# Patient Record
Sex: Female | Born: 1935 | Race: White | Hispanic: No | Smoking: Never smoker
Health system: Southern US, Community
[De-identification: ages and names within clinical notes are randomized; demographics above are authoritative.]

## PROBLEM LIST (undated history)

## (undated) DIAGNOSIS — I10 Essential (primary) hypertension: Secondary | ICD-10-CM

## (undated) DIAGNOSIS — E785 Hyperlipidemia, unspecified: Secondary | ICD-10-CM

## (undated) DIAGNOSIS — C9110 Chronic lymphocytic leukemia of B-cell type not having achieved remission: Principal | ICD-10-CM

## (undated) DIAGNOSIS — C9590 Leukemia, unspecified not having achieved remission: Secondary | ICD-10-CM

## (undated) DIAGNOSIS — R0602 Shortness of breath: Secondary | ICD-10-CM

## (undated) HISTORY — DX: Chronic lymphocytic leukemia of B-cell type not having achieved remission: C91.10

## (undated) HISTORY — PX: CHOLECYSTECTOMY: SHX55

## (undated) HISTORY — DX: Shortness of breath: R06.02

## (undated) HISTORY — DX: Hyperlipidemia, unspecified: E78.5

## (undated) HISTORY — PX: TOTAL KNEE ARTHROPLASTY: SHX125

## (undated) HISTORY — DX: Essential (primary) hypertension: I10

## (undated) HISTORY — PX: TONSILLECTOMY: SUR1361

---

## 1998-10-18 ENCOUNTER — Other Ambulatory Visit: Admission: RE | Admit: 1998-10-18 | Discharge: 1998-10-18 | Payer: Self-pay | Admitting: Family Medicine

## 2000-02-17 ENCOUNTER — Encounter: Admission: RE | Admit: 2000-02-17 | Discharge: 2000-05-17 | Payer: Self-pay | Admitting: Family Medicine

## 2000-12-06 ENCOUNTER — Other Ambulatory Visit: Admission: RE | Admit: 2000-12-06 | Discharge: 2000-12-06 | Payer: Self-pay | Admitting: Family Medicine

## 2001-10-24 ENCOUNTER — Other Ambulatory Visit: Admission: RE | Admit: 2001-10-24 | Discharge: 2001-10-24 | Payer: Self-pay | Admitting: Oncology

## 2003-02-06 ENCOUNTER — Encounter: Payer: Self-pay | Admitting: Family Medicine

## 2003-02-06 ENCOUNTER — Ambulatory Visit (HOSPITAL_COMMUNITY): Admission: RE | Admit: 2003-02-06 | Discharge: 2003-02-06 | Payer: Self-pay | Admitting: Orthopedic Surgery

## 2003-02-06 ENCOUNTER — Ambulatory Visit: Admission: RE | Admit: 2003-02-06 | Discharge: 2003-02-06 | Payer: Self-pay | Admitting: Family Medicine

## 2003-07-05 ENCOUNTER — Ambulatory Visit: Admission: RE | Admit: 2003-07-05 | Discharge: 2003-07-05 | Payer: Self-pay | Admitting: Orthopedic Surgery

## 2003-07-10 ENCOUNTER — Inpatient Hospital Stay (HOSPITAL_COMMUNITY): Admission: RE | Admit: 2003-07-10 | Discharge: 2003-07-14 | Payer: Self-pay | Admitting: Orthopedic Surgery

## 2003-11-02 ENCOUNTER — Observation Stay (HOSPITAL_COMMUNITY): Admission: RE | Admit: 2003-11-02 | Discharge: 2003-11-03 | Payer: Self-pay

## 2003-11-02 ENCOUNTER — Encounter (INDEPENDENT_AMBULATORY_CARE_PROVIDER_SITE_OTHER): Payer: Self-pay | Admitting: Specialist

## 2004-04-24 ENCOUNTER — Ambulatory Visit: Payer: Self-pay | Admitting: Oncology

## 2004-10-22 ENCOUNTER — Ambulatory Visit: Payer: Self-pay | Admitting: Oncology

## 2005-04-23 ENCOUNTER — Ambulatory Visit: Payer: Self-pay | Admitting: Oncology

## 2005-10-21 ENCOUNTER — Ambulatory Visit: Payer: Self-pay | Admitting: Oncology

## 2005-10-23 LAB — CBC WITH DIFFERENTIAL/PLATELET
Basophils Absolute: 0 10*3/uL (ref 0.0–0.1)
EOS%: 1.5 % (ref 0.0–7.0)
HCT: 34.3 % — ABNORMAL LOW (ref 34.8–46.6)
MCHC: 34.7 g/dL (ref 32.0–36.0)
MCV: 91.7 fL (ref 81.0–101.0)
MONO#: 0.6 10*3/uL (ref 0.1–0.9)
Platelets: 236 10*3/uL (ref 145–400)
RBC: 3.74 10*6/uL (ref 3.70–5.32)
lymph#: 6.7 10*3/uL — ABNORMAL HIGH (ref 0.9–3.3)

## 2005-10-23 LAB — COMPREHENSIVE METABOLIC PANEL
Alkaline Phosphatase: 86 U/L (ref 39–117)
BUN: 20 mg/dL (ref 6–23)
Glucose, Bld: 293 mg/dL — ABNORMAL HIGH (ref 70–99)
Sodium: 134 mEq/L — ABNORMAL LOW (ref 135–145)
Total Bilirubin: 0.5 mg/dL (ref 0.3–1.2)

## 2005-10-23 LAB — MORPHOLOGY: PLT EST: ADEQUATE

## 2006-02-23 HISTORY — PX: US ECHOCARDIOGRAPHY: HXRAD669

## 2006-04-28 ENCOUNTER — Ambulatory Visit: Payer: Self-pay | Admitting: Oncology

## 2006-04-30 LAB — FERRITIN: Ferritin: 72 ng/mL (ref 10–291)

## 2006-04-30 LAB — CBC WITH DIFFERENTIAL/PLATELET
Basophils Absolute: 0 10*3/uL (ref 0.0–0.1)
Eosinophils Absolute: 0.2 10*3/uL (ref 0.0–0.5)
HGB: 11.1 g/dL — ABNORMAL LOW (ref 11.6–15.9)
MCV: 90.9 fL (ref 81.0–101.0)
MONO#: 0.5 10*3/uL (ref 0.1–0.9)
MONO%: 4.6 % (ref 0.0–13.0)
NEUT#: 4.6 10*3/uL (ref 1.5–6.5)
RDW: 13.1 % (ref 11.3–14.5)
WBC: 10.4 10*3/uL — ABNORMAL HIGH (ref 3.9–10.0)
lymph#: 5.1 10*3/uL — ABNORMAL HIGH (ref 0.9–3.3)

## 2006-04-30 LAB — COMPREHENSIVE METABOLIC PANEL
ALT: 24 U/L (ref 0–35)
AST: 22 U/L (ref 0–37)
Albumin: 4.3 g/dL (ref 3.5–5.2)
Alkaline Phosphatase: 88 U/L (ref 39–117)
Potassium: 5.5 mEq/L — ABNORMAL HIGH (ref 3.5–5.3)
Sodium: 137 mEq/L (ref 135–145)
Total Bilirubin: 0.4 mg/dL (ref 0.3–1.2)
Total Protein: 7 g/dL (ref 6.0–8.3)

## 2006-04-30 LAB — IRON AND TIBC
TIBC: 343 ug/dL (ref 250–470)
UIBC: 242 ug/dL

## 2006-04-30 LAB — ERYTHROCYTE SEDIMENTATION RATE: Sed Rate: 14 mm/hr (ref 0–30)

## 2006-04-30 LAB — MORPHOLOGY
PLT EST: ADEQUATE
RBC Comments: NORMAL

## 2006-09-21 ENCOUNTER — Encounter (HOSPITAL_COMMUNITY): Admission: RE | Admit: 2006-09-21 | Discharge: 2006-12-07 | Payer: Self-pay | Admitting: Oncology

## 2006-09-21 ENCOUNTER — Ambulatory Visit: Payer: Self-pay | Admitting: Oncology

## 2006-09-21 LAB — CBC & DIFF AND RETIC
BASO%: 1.1 % (ref 0.0–2.0)
Eosinophils Absolute: 0.3 10*3/uL (ref 0.0–0.5)
HCT: 22.7 % — ABNORMAL LOW (ref 34.8–46.6)
LYMPH%: 29.6 % (ref 14.0–48.0)
MCHC: 33.7 g/dL (ref 32.0–36.0)
MCV: 92 fL (ref 81.0–101.0)
MONO#: 0.5 10*3/uL (ref 0.1–0.9)
NEUT%: 64.3 % (ref 39.6–76.8)
Platelets: 569 10*3/uL — ABNORMAL HIGH (ref 145–400)
WBC: 15.7 10*3/uL — ABNORMAL HIGH (ref 3.9–10.0)

## 2006-09-21 LAB — COMPREHENSIVE METABOLIC PANEL
Albumin: 2.5 g/dL — ABNORMAL LOW (ref 3.5–5.2)
CO2: 21 mEq/L (ref 19–32)
Calcium: 9 mg/dL (ref 8.4–10.5)
Glucose, Bld: 227 mg/dL — ABNORMAL HIGH (ref 70–99)
Potassium: 5 mEq/L (ref 3.5–5.3)
Sodium: 140 mEq/L (ref 135–145)
Total Protein: 6.3 g/dL (ref 6.0–8.3)

## 2006-09-21 LAB — LACTATE DEHYDROGENASE: LDH: 197 U/L (ref 94–250)

## 2006-09-21 LAB — MORPHOLOGY

## 2006-09-21 LAB — FOLATE: Folate: >20 ng/mL

## 2006-09-21 LAB — IRON AND TIBC: Iron: 57 ug/dL (ref 42–145)

## 2006-09-21 LAB — HAPTOGLOBIN: Haptoglobin: 282 mg/dL — ABNORMAL HIGH (ref 16–200)

## 2006-09-23 LAB — CBC WITH DIFFERENTIAL/PLATELET
BASO%: 0.4 % (ref 0.0–2.0)
Basophils Absolute: 0 10*3/uL (ref 0.0–0.1)
EOS%: 1.8 % (ref 0.0–7.0)
MCH: 31.2 pg (ref 26.0–34.0)
MCHC: 34.6 g/dL (ref 32.0–36.0)
MCV: 90.2 fL (ref 81.0–101.0)
MONO%: 5.8 % (ref 0.0–13.0)
RDW: 15.5 % — ABNORMAL HIGH (ref 11.3–14.5)
lymph#: 3.8 10*3/uL — ABNORMAL HIGH (ref 0.9–3.3)

## 2006-09-27 LAB — CBC WITH DIFFERENTIAL/PLATELET
BASO%: 0.9 % (ref 0.0–2.0)
Basophils Absolute: 0.1 10*3/uL (ref 0.0–0.1)
EOS%: 2.2 % (ref 0.0–7.0)
HCT: 33.3 % — ABNORMAL LOW (ref 34.8–46.6)
HGB: 10.9 g/dL — ABNORMAL LOW (ref 11.6–15.9)
MCH: 30.5 pg (ref 26.0–34.0)
MCHC: 32.8 g/dL (ref 32.0–36.0)
MCV: 93 fL (ref 81.0–101.0)
MONO%: 7.1 % (ref 0.0–13.0)
NEUT%: 50.2 % (ref 39.6–76.8)
RDW: 15.4 % — ABNORMAL HIGH (ref 11.3–14.5)
lymph#: 3.4 10*3/uL — ABNORMAL HIGH (ref 0.9–3.3)

## 2006-09-27 LAB — COMPREHENSIVE METABOLIC PANEL
ALT: 26 U/L (ref 0–35)
AST: 24 U/L (ref 0–37)
Alkaline Phosphatase: 124 U/L — ABNORMAL HIGH (ref 39–117)
BUN: 19 mg/dL (ref 6–23)
Creatinine, Ser: 1.17 mg/dL (ref 0.40–1.20)

## 2006-09-27 LAB — PNH PROFILE (-HIGH SENSITIVITY)
% CD55: 100 % (ref 99.995–100.000)
% PNH Cells: 0 % (ref 0.000–0.005)

## 2006-09-30 LAB — CBC WITH DIFFERENTIAL/PLATELET
Basophils Absolute: 0 10*3/uL (ref 0.0–0.1)
EOS%: 2.3 % (ref 0.0–7.0)
HCT: 32.3 % — ABNORMAL LOW (ref 34.8–46.6)
HGB: 11 g/dL — ABNORMAL LOW (ref 11.6–15.9)
LYMPH%: 47.1 % (ref 14.0–48.0)
MCH: 31.1 pg (ref 26.0–34.0)
MCV: 90.8 fL (ref 81.0–101.0)
NEUT%: 40.8 % (ref 39.6–76.8)
Platelets: 347 10*3/uL (ref 145–400)
lymph#: 3.6 10*3/uL — ABNORMAL HIGH (ref 0.9–3.3)

## 2006-10-07 LAB — LACTATE DEHYDROGENASE: LDH: 224 U/L (ref 94–250)

## 2006-10-07 LAB — CBC WITH DIFFERENTIAL/PLATELET
Basophils Absolute: 0.1 10*3/uL (ref 0.0–0.1)
Eosinophils Absolute: 0.2 10*3/uL (ref 0.0–0.5)
LYMPH%: 43.9 % (ref 14.0–48.0)
MCH: 31.1 pg (ref 26.0–34.0)
MCV: 90.2 fL (ref 81.0–101.0)
MONO%: 5.9 % (ref 0.0–13.0)
NEUT#: 6.9 10*3/uL — ABNORMAL HIGH (ref 1.5–6.5)
Platelets: 315 10*3/uL (ref 145–400)
RBC: 3.51 10*6/uL — ABNORMAL LOW (ref 3.70–5.32)

## 2006-10-07 LAB — HAPTOGLOBIN: Haptoglobin: 168 mg/dL (ref 16–200)

## 2006-10-15 LAB — CBC WITH DIFFERENTIAL/PLATELET
BASO%: 0.3 % (ref 0.0–2.0)
EOS%: 1.5 % (ref 0.0–7.0)
HCT: 31.2 % — ABNORMAL LOW (ref 34.8–46.6)
MCH: 31.2 pg (ref 26.0–34.0)
MCHC: 34.6 g/dL (ref 32.0–36.0)
MONO#: 0.6 10*3/uL (ref 0.1–0.9)
NEUT%: 45.9 % (ref 39.6–76.8)
RBC: 3.46 10*6/uL — ABNORMAL LOW (ref 3.70–5.32)
RDW: 15.5 % — ABNORMAL HIGH (ref 11.3–14.5)
WBC: 10.1 10*3/uL — ABNORMAL HIGH (ref 3.9–10.0)
lymph#: 4.7 10*3/uL — ABNORMAL HIGH (ref 0.9–3.3)

## 2006-10-15 LAB — MORPHOLOGY: PLT EST: ADEQUATE

## 2006-10-18 ENCOUNTER — Ambulatory Visit (HOSPITAL_COMMUNITY): Admission: RE | Admit: 2006-10-18 | Discharge: 2006-10-18 | Payer: Self-pay | Admitting: Gastroenterology

## 2006-10-22 LAB — CBC WITH DIFFERENTIAL/PLATELET
BASO%: 1 % (ref 0.0–2.0)
EOS%: 1.7 % (ref 0.0–7.0)
HCT: 31.1 % — ABNORMAL LOW (ref 34.8–46.6)
MCH: 31.5 pg (ref 26.0–34.0)
MCHC: 34.5 g/dL (ref 32.0–36.0)
MONO#: 0.5 10*3/uL (ref 0.1–0.9)
NEUT%: 48 % (ref 39.6–76.8)
RBC: 3.4 10*6/uL — ABNORMAL LOW (ref 3.70–5.32)
RDW: 13.7 % (ref 11.3–14.5)
WBC: 10 10*3/uL (ref 3.9–10.0)
lymph#: 4.4 10*3/uL — ABNORMAL HIGH (ref 0.9–3.3)

## 2006-10-22 LAB — MORPHOLOGY: PLT EST: ADEQUATE

## 2006-10-22 LAB — CHCC SMEAR

## 2006-11-18 ENCOUNTER — Ambulatory Visit: Payer: Self-pay | Admitting: Oncology

## 2006-11-23 LAB — CHCC SMEAR

## 2006-11-23 LAB — CBC & DIFF AND RETIC
BASO%: 0.3 % (ref 0.0–2.0)
Eosinophils Absolute: 0.2 10*3/uL (ref 0.0–0.5)
IRF: 0.37 — ABNORMAL HIGH (ref 0.130–0.330)
LYMPH%: 44.4 % (ref 14.0–48.0)
MCHC: 34.8 g/dL (ref 32.0–36.0)
MONO#: 0.6 10*3/uL (ref 0.1–0.9)
NEUT#: 4.8 10*3/uL (ref 1.5–6.5)
RBC: 3.41 10*6/uL — ABNORMAL LOW (ref 3.70–5.32)
RDW: 13.9 % (ref 11.3–14.5)
RETIC #: 62.4 10*3/uL (ref 19.7–115.1)
Retic %: 1.8 % (ref 0.4–2.3)
WBC: 10 10*3/uL (ref 3.9–10.0)
lymph#: 4.4 10*3/uL — ABNORMAL HIGH (ref 0.9–3.3)

## 2006-11-23 LAB — FECAL OCCULT BLOOD, GUAIAC: Occult Blood: NEGATIVE

## 2007-01-10 HISTORY — PX: US ECHOCARDIOGRAPHY: HXRAD669

## 2007-01-18 ENCOUNTER — Ambulatory Visit: Payer: Self-pay | Admitting: Oncology

## 2007-01-20 LAB — COMPREHENSIVE METABOLIC PANEL
ALT: 16 U/L (ref 0–35)
BUN: 24 mg/dL — ABNORMAL HIGH (ref 6–23)
CO2: 19 mEq/L (ref 19–32)
Calcium: 9.2 mg/dL (ref 8.4–10.5)
Chloride: 108 mEq/L (ref 96–112)
Creatinine, Ser: 1.01 mg/dL (ref 0.40–1.20)

## 2007-01-20 LAB — IRON AND TIBC
TIBC: 364 ug/dL (ref 250–470)
UIBC: 260 ug/dL

## 2007-01-20 LAB — CBC & DIFF AND RETIC
BASO%: 0.4 % (ref 0.0–2.0)
Basophils Absolute: 0 10*3/uL (ref 0.0–0.1)
EOS%: 1.5 % (ref 0.0–7.0)
HGB: 11.3 g/dL — ABNORMAL LOW (ref 11.6–15.9)
MCH: 31.2 pg (ref 26.0–34.0)
MCHC: 34.8 g/dL (ref 32.0–36.0)
MCV: 89.4 fL (ref 81.0–101.0)
MONO%: 1.1 % (ref 0.0–13.0)
RDW: 13.5 % (ref 11.3–14.5)
RETIC #: 39.3 10*3/uL (ref 19.7–115.1)

## 2007-01-20 LAB — LACTATE DEHYDROGENASE: LDH: 213 U/L (ref 94–250)

## 2007-07-21 ENCOUNTER — Ambulatory Visit: Payer: Self-pay | Admitting: Oncology

## 2007-07-25 LAB — CBC & DIFF AND RETIC
Basophils Absolute: 0.1 10*3/uL (ref 0.0–0.1)
EOS%: 1.7 % (ref 0.0–7.0)
Eosinophils Absolute: 0.2 10*3/uL (ref 0.0–0.5)
HGB: 11.1 g/dL — ABNORMAL LOW (ref 11.6–15.9)
LYMPH%: 42.1 % (ref 14.0–48.0)
MCH: 32.2 pg (ref 26.0–34.0)
MCV: 91.4 fL (ref 81.0–101.0)
MONO%: 4.7 % (ref 0.0–13.0)
NEUT#: 6.9 10*3/uL — ABNORMAL HIGH (ref 1.5–6.5)
Platelets: 208 10*3/uL (ref 145–400)
RBC: 3.44 10*6/uL — ABNORMAL LOW (ref 3.70–5.32)
RDW: 13.5 % (ref 11.3–14.5)
RETIC #: 62.6 10*3/uL (ref 19.7–115.1)

## 2007-07-27 LAB — COMPREHENSIVE METABOLIC PANEL
Albumin: 4.5 g/dL (ref 3.5–5.2)
BUN: 15 mg/dL (ref 6–23)
Calcium: 9.6 mg/dL (ref 8.4–10.5)
Chloride: 109 mEq/L (ref 96–112)
Creatinine, Ser: 0.9 mg/dL (ref 0.40–1.20)
Glucose, Bld: 130 mg/dL — ABNORMAL HIGH (ref 70–99)
Potassium: 4.6 mEq/L (ref 3.5–5.3)

## 2007-07-27 LAB — LACTATE DEHYDROGENASE: LDH: 211 U/L (ref 94–250)

## 2007-07-27 LAB — IMMUNOFIXATION ELECTROPHORESIS: IgM, Serum: 36 mg/dL — ABNORMAL LOW (ref 60–263)

## 2008-03-27 ENCOUNTER — Ambulatory Visit: Payer: Self-pay | Admitting: Oncology

## 2008-03-29 LAB — CBC WITH DIFFERENTIAL/PLATELET
BASO%: 0.3 % (ref 0.0–2.0)
Basophils Absolute: 0.1 10*3/uL (ref 0.0–0.1)
EOS%: 0.9 % (ref 0.0–7.0)
HGB: 12.5 g/dL (ref 11.6–15.9)
MCH: 31.2 pg (ref 26.0–34.0)
MCHC: 33.6 g/dL (ref 32.0–36.0)
MCV: 92.9 fL (ref 81.0–101.0)
MONO%: 3.9 % (ref 0.0–13.0)
NEUT%: 45.7 % (ref 39.6–76.8)
RDW: 12.3 % (ref 11.3–14.5)

## 2008-04-02 LAB — SPEP & IFE WITH QIG
Albumin ELP: 55.8 % (ref 55.8–66.1)
IgA: 474 mg/dL — ABNORMAL HIGH (ref 68–378)
IgG (Immunoglobin G), Serum: 751 mg/dL (ref 694–1618)
IgM, Serum: 35 mg/dL — ABNORMAL LOW (ref 60–263)
Total Protein, Serum Electrophoresis: 7.1 g/dL (ref 6.0–8.3)

## 2008-04-02 LAB — COMPREHENSIVE METABOLIC PANEL
ALT: 18 U/L (ref 0–35)
AST: 15 U/L (ref 0–37)
Albumin: 4.3 g/dL (ref 3.5–5.2)
Alkaline Phosphatase: 63 U/L (ref 39–117)
Calcium: 9.4 mg/dL (ref 8.4–10.5)
Chloride: 100 mEq/L (ref 96–112)
Potassium: 4.5 mEq/L (ref 3.5–5.3)

## 2008-04-02 LAB — IRON AND TIBC: UIBC: 267 ug/dL

## 2008-09-27 ENCOUNTER — Ambulatory Visit: Payer: Self-pay | Admitting: Oncology

## 2008-09-27 LAB — CBC & DIFF AND RETIC
Basophils Absolute: 0 10*3/uL (ref 0.0–0.1)
Eosinophils Absolute: 0.2 10*3/uL (ref 0.0–0.5)
HCT: 35.4 % (ref 34.8–46.6)
HGB: 11.8 g/dL (ref 11.6–15.9)
IRF: 0.29 (ref 0.130–0.330)
LYMPH%: 53.1 % — ABNORMAL HIGH (ref 14.0–49.7)
MONO#: 0.6 10*3/uL (ref 0.1–0.9)
NEUT%: 41.3 % (ref 38.4–76.8)
Platelets: 213 10*3/uL (ref 145–400)
RETIC #: 51.9 10*3/uL (ref 19.7–115.1)
WBC: 14.3 10*3/uL — ABNORMAL HIGH (ref 3.9–10.3)
lymph#: 7.6 10*3/uL — ABNORMAL HIGH (ref 0.9–3.3)

## 2008-09-27 LAB — COMPREHENSIVE METABOLIC PANEL
ALT: 23 U/L (ref 0–35)
AST: 18 U/L (ref 0–37)
CO2: 20 mEq/L (ref 19–32)
Calcium: 9.4 mg/dL (ref 8.4–10.5)
Chloride: 109 mEq/L (ref 96–112)
Creatinine, Ser: 0.93 mg/dL (ref 0.40–1.20)
Potassium: 5 mEq/L (ref 3.5–5.3)
Sodium: 139 mEq/L (ref 135–145)
Total Protein: 6.7 g/dL (ref 6.0–8.3)

## 2008-09-27 LAB — MORPHOLOGY

## 2009-05-13 ENCOUNTER — Ambulatory Visit: Payer: Self-pay | Admitting: Oncology

## 2009-05-15 LAB — CBC & DIFF AND RETIC
BASO%: 0.3 % (ref 0.0–2.0)
Basophils Absolute: 0.1 10*3/uL (ref 0.0–0.1)
EOS%: 1.5 % (ref 0.0–7.0)
Eosinophils Absolute: 0.3 10*3/uL (ref 0.0–0.5)
HCT: 37.3 % (ref 34.8–46.6)
HGB: 12.1 g/dL (ref 11.6–15.9)
Immature Retic Fract: 10.1 % (ref 0.00–10.70)
LYMPH%: 43.2 % (ref 14.0–49.7)
MCH: 30.5 pg (ref 25.1–34.0)
MCHC: 32.4 g/dL (ref 31.5–36.0)
MCV: 94 fL (ref 79.5–101.0)
MONO#: 0.7 10*3/uL (ref 0.1–0.9)
MONO%: 4.2 % (ref 0.0–14.0)
NEUT#: 8.3 10*3/uL — ABNORMAL HIGH (ref 1.5–6.5)
NEUT%: 50.8 % (ref 38.4–76.8)
Platelets: 241 10*3/uL (ref 145–400)
RBC: 3.97 10*6/uL (ref 3.70–5.45)
RDW: 13.5 % (ref 11.2–14.5)
Retic %: 1.61 % — ABNORMAL HIGH (ref 0.50–1.50)
Retic Ct Abs: 63.92 10*3/uL (ref 18.30–72.70)
WBC: 16.3 10*3/uL — ABNORMAL HIGH (ref 3.9–10.3)
lymph#: 7 10*3/uL — ABNORMAL HIGH (ref 0.9–3.3)

## 2009-05-15 LAB — CHCC SMEAR

## 2009-05-15 LAB — MORPHOLOGY
PLT EST: ADEQUATE
RBC Comments: NORMAL

## 2009-05-17 LAB — PROTEIN ELECTROPHORESIS, SERUM, WITH REFLEX
Alpha-1-Globulin: 3.9 % (ref 2.9–4.9)
Beta 2: 7.4 % — ABNORMAL HIGH (ref 3.2–6.5)
Gamma Globulin: 11.8 % (ref 11.1–18.8)

## 2009-05-17 LAB — COMPREHENSIVE METABOLIC PANEL
ALT: 16 U/L (ref 0–35)
AST: 14 U/L (ref 0–37)
Albumin: 4.5 g/dL (ref 3.5–5.2)
Calcium: 9.8 mg/dL (ref 8.4–10.5)
Chloride: 105 mEq/L (ref 96–112)
Potassium: 5.2 mEq/L (ref 3.5–5.3)
Sodium: 137 mEq/L (ref 135–145)
Total Protein: 6.9 g/dL (ref 6.0–8.3)

## 2010-05-14 ENCOUNTER — Ambulatory Visit: Payer: Self-pay | Admitting: Oncology

## 2010-05-16 LAB — COMPREHENSIVE METABOLIC PANEL
ALT: 28 U/L (ref 0–35)
AST: 22 U/L (ref 0–37)
Albumin: 4.2 g/dL (ref 3.5–5.2)
Alkaline Phosphatase: 56 U/L (ref 39–117)
BUN: 17 mg/dL (ref 6–23)
CO2: 19 mEq/L (ref 19–32)
Calcium: 9.4 mg/dL (ref 8.4–10.5)
Chloride: 108 mEq/L (ref 96–112)
Creatinine, Ser: 0.97 mg/dL (ref 0.40–1.20)
Glucose, Bld: 96 mg/dL (ref 70–99)
Potassium: 4.2 mEq/L (ref 3.5–5.3)
Sodium: 140 mEq/L (ref 135–145)
Total Bilirubin: 0.2 mg/dL — ABNORMAL LOW (ref 0.3–1.2)
Total Protein: 6.5 g/dL (ref 6.0–8.3)

## 2010-05-16 LAB — CBC & DIFF AND RETIC
BASO%: 0.2 % (ref 0.0–2.0)
Basophils Absolute: 0 10*3/uL (ref 0.0–0.1)
EOS%: 1.9 % (ref 0.0–7.0)
Eosinophils Absolute: 0.2 10*3/uL (ref 0.0–0.5)
HCT: 37.1 % (ref 34.8–46.6)
HGB: 12.4 g/dL (ref 11.6–15.9)
Immature Retic Fract: 9.7 % (ref 0.00–10.70)
LYMPH%: 48.7 % (ref 14.0–49.7)
MCH: 30.9 pg (ref 25.1–34.0)
MCHC: 33.4 g/dL (ref 31.5–36.0)
MCV: 92.5 fL (ref 79.5–101.0)
MONO#: 0.6 10*3/uL (ref 0.1–0.9)
MONO%: 5 % (ref 0.0–14.0)
NEUT#: 5.5 10*3/uL (ref 1.5–6.5)
NEUT%: 44.2 % (ref 38.4–76.8)
Platelets: 203 10*3/uL (ref 145–400)
RBC: 4.01 10*6/uL (ref 3.70–5.45)
RDW: 13.2 % (ref 11.2–14.5)
Retic %: 1.52 % — ABNORMAL HIGH (ref 0.50–1.50)
Retic Ct Abs: 60.95 10*3/uL (ref 18.30–72.70)
WBC: 12.4 10*3/uL — ABNORMAL HIGH (ref 3.9–10.3)
lymph#: 6.1 10*3/uL — ABNORMAL HIGH (ref 0.9–3.3)
nRBC: 0 % (ref 0–0)

## 2010-05-16 LAB — CHCC SMEAR

## 2010-05-16 LAB — MORPHOLOGY
PLT EST: ADEQUATE
RBC Comments: NORMAL

## 2010-05-16 LAB — LACTATE DEHYDROGENASE: LDH: 233 U/L (ref 94–250)

## 2010-05-20 ENCOUNTER — Ambulatory Visit: Payer: Self-pay | Admitting: Cardiovascular Disease

## 2010-05-20 ENCOUNTER — Encounter: Admission: RE | Admit: 2010-05-20 | Discharge: 2010-05-20 | Payer: Self-pay | Admitting: Cardiovascular Disease

## 2010-05-28 ENCOUNTER — Telehealth (INDEPENDENT_AMBULATORY_CARE_PROVIDER_SITE_OTHER): Payer: Self-pay | Admitting: Radiology

## 2010-05-29 ENCOUNTER — Encounter (HOSPITAL_COMMUNITY)
Admission: RE | Admit: 2010-05-29 | Discharge: 2010-07-15 | Payer: Self-pay | Source: Home / Self Care | Attending: Cardiovascular Disease | Admitting: Cardiovascular Disease

## 2010-05-29 ENCOUNTER — Ambulatory Visit: Payer: Self-pay

## 2010-05-29 ENCOUNTER — Encounter: Payer: Self-pay | Admitting: Cardiovascular Disease

## 2010-05-29 HISTORY — PX: CARDIOVASCULAR STRESS TEST: SHX262

## 2010-06-24 ENCOUNTER — Ambulatory Visit: Payer: Self-pay | Admitting: Cardiovascular Disease

## 2010-07-17 NOTE — Progress Notes (Signed)
Summary: nuc pre-procedure  Phone Note Outgoing Call   Call placed by: Harlow Asa CNMT Call placed to: Patient Reason for Call: Confirm/change Appt Summary of Call: Reviewed information on [employer] Information Sheet (see scanned document for further details).  Spoke with patient.      Nuclear Med Background Indications for Stress Test: Evaluation for Ischemia   History: Myocardial Infarction  History Comments: '08 MPI EF= 83%  Symptoms: Chest Pain, Chest Pain with Exertion    Nuclear Pre-Procedure Cardiac Risk Factors: Hypertension, Lipids, NIDDM

## 2010-07-17 NOTE — Assessment & Plan Note (Signed)
Summary: Cardiology Nuclear Testing  Nuclear Med Background Indications for Stress Test: Evaluation for Ischemia   History: Myocardial Infarction  History Comments: '08 MPI:NL, EF= 83%  Symptoms: Chest Pain, Chest Pain with Exertion, DOE, Fatigue, Fatigue with Exertion, Rapid HR    Nuclear Pre-Procedure Cardiac Risk Factors: Hypertension, Lipids, NIDDM Caffeine/Decaff Intake: None NPO After: 9:30 PM Lungs: Clear IV 0.9% NS with Angio Cath: 22g     IV Site: R Hand IV Started by: Bonnita Levan, RN Chest Size (in) 36     Cup Size A     Height (in): 62 Weight (lb): 186 BMI: 34.14  Nuclear Med Study 1 or 2 day study:  1 day     Stress Test Type:  Treadmill/Lexiscan Reading MD:  Kristeen Miss, MD     Referring MD:  P.Jakie Debow Resting Radionuclide:  Technetium 61m Tetrofosmin     Resting Radionuclide Dose:  11.0 mCi  Stress Radionuclide:  Technetium 108m Tetrofosmin     Stress Radionuclide Dose:  33.0 mCi   Stress Protocol      Max HR:  127 bpm     Predicted Max HR:  146 bpm  Max Systolic BP: 154 mm Hg     Percent Max HR:  86.99 %Rate Pressure Product:  19558  Lexiscan: 0.4 mg   Stress Test Technologist:  Irean Hong,  RN     Nuclear Technologist:  Domenic Polite, CNMT  Rest Procedure  Myocardial perfusion imaging was performed at rest 45 minutes following the intravenous administration of Technetium 84m Tetrofosmin.  Stress Procedure  The patient received IV Lexiscan 0.4 mg over 15-seconds with concurrent low level exercise and then Technetium 33m Tetrofosmin was injected at 30-seconds while the treadmill was stopped early due to DOE.  There were no significant changes with Lexiscan.  Quantitative spect images were obtained after a 45 minute delay.  QPS Raw Data Images:  Normal; no motion artifact; normal heart/lung ratio. Stress Images:  Normal homogeneous uptake in all areas of the myocardium. Rest Images:  Normal homogeneous uptake in all areas of the  myocardium. Subtraction (SDS):  Normal Transient Ischemic Dilatation:  0.86  (Normal <1.22)  Lung/Heart Ratio:  0.29  (Normal <0.45)  Quantitative Gated Spect Images QGS EDV:  51 ml QGS ESV:  7 ml QGS EF:  85 % QGS cine images:  The LV systolic function is hyperdynamic.  Findings Normal nuclear study      Overall Impression  Exercise Capacity: Lexiscan with low level exercise BP Response: Normal blood pressure response. Clinical Symptoms: No chest pain ECG Impression: No significant ST segment change suggestive of ischemia. Overall Impression: Normal stress nuclear study. Overall Impression Comments: No evidence of ischemia.  Normal LV function.  Appended Document: Cardiology Nuclear Testing copy sent to Yarethzi Branan

## 2010-10-31 NOTE — H&P (Signed)
NAME:  Carrie Atkins, Carrie Atkins                         ACCOUNT NO.:  [id number]   MEDICAL RECORD NO.:  [id number]                   PATIENT TYPE:  INP   LOCATION:                                       FACILITY:  [facility]   PHYSICIAN:  Rodney A. Chaney Malling, M.D.           DATE OF BIRTH:  Feb 06, 1934   DATE OF ADMISSION:  07/10/2003  DATE OF DISCHARGE:                                HISTORY & PHYSICAL   CHIEF COMPLAINT:  Right knee pain.   HISTORY OF PRESENT ILLNESS:  Carrie Atkins is a 77 year old white female with  right knee pain since August of 2004.  The patient denies any injury to the  right knee.  She described her right knee pain as toothache-like pain x3.  The pain is constant.  The pain is at its worst the first thing in the  morning.  She also has increasing pain when getting up from a sitting  position after sitting for long periods of time.  The patient has undergone  cortisone injections that have had decreasing effectiveness, with the last  cortisone injection only lasting a few days.  She has also undergone Synvisc  injections with no relief.  The patient takes Mobic with fair pain relief.  Mechanical symptoms are positive for catching.  Pain awakens her at night.  She uses a cane to ambulate.   ALLERGIES:  SULFA DRUGS.   CURRENT MEDICATIONS:  1. Metformin 500 mg 2 daily.  2. Enalapril 5 mg 1 daily.  3. Lovastatin 20 mg 1 at supper __________.  4. Mobic 15 mg 1 at lunchtime.  5. Aspirin 81 mg daily.  6. Tylenol Arthritis 650 mg 2 in the morning and 2 at night.  7. Calcium and magnesium supplement 1000 mg 2 tablets in the morning and 2     tablets at night.  8. Centrum Silver multivitamin 1 daily.  9. Gemfibrozil 600 mg -- 300 mg before breakfast and then 300 mg with her     evening meal.  10.      Trazodone 150 mg 2 tablets as bedtime and then 3/4ths of a tablet     in the morning.   PAST MEDICAL HISTORY:  Past medical history is positive for:  1. Chronic lymphocytic  leukemia.  2. Dyslipidemia.  3. Diabetes mellitus.  4. Hypertension.   PAST SURGICAL HISTORY:  1. Tubal ligation in 1976.  2. Tonsillectomy and uvulectomy.  3. Removal of a benign growth on her left arm in 1980.  4. D&C in 1997.   The patient states that she has some difficulty with waking up from  anesthesia, however, has no respiratory depression or any cardiac issues.   TRANSFUSIONS:  She has a history of a blood transfusion after delivering her  child.   SOCIAL HISTORY:  The patient denies any tobacco or alcohol use.  She is  widowed and has 2 adult 48 year old child.  PRIMARY CARE PHYSICIAN:  Dr. Windle Guard of [place]; phone number  is [phone].   SOCIAL HISTORY:  The patient lives in a 1-story home with 2 steps to the  usual entrance.  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   FAMILY HISTORY:  Mother deceased at age 75 due to a stroke.  Father deceased  at age 33 due to lung cancer and he also had a history of a gastric ulcer.  The patient has 1 living brother who has no known health problems.  She has  a sister who has hypertension and history of knee replacement.  The patient  states that the family has a strong history of blood clots after surgery.   REVIEW OF SYSTEMS:  Nonproductive cough and postnasal drip for the past few  weeks.  The patient has been afebrile.  She wears glasses.  She denies any  chest pain, shortness of breath, PND or orthopnea.  GI:  Review of GI system  is negative.  GU:  Has nocturia x2, otherwise, GU is negative.  NEUROLOGIC/HEMATOLOGIC: The patient denies any neurologic or hematologic  history.   PHYSICAL EXAM:  GENERAL:  The patient is a well-developed, well-nourished  overweight female who walks with a limp and antalgic gait on the right.  The  patient's mood is age-appropriate.  The patient talks easily with examiner.  The patient's height is 5 foot 2-1/2 inches, weight 180 pounds.  VITAL  SIGNS:  Temperature is 97.0, blood pressure 110/62, pulse is 68,  respiratory rate is 16.  HEENT:  Normocephalic and atraumatic without frontal or maxillary sinus  tenderness to palpation.  Conjunctivae are pink bilaterally.  Sclerae are  nonicteric.  PERRLA.  EOMs are intact.  No visible external ear deformities  are noted.  TMs are pearly and gray.  Nose:  Nasal septum midline.  Nasal  mucosa is pink, moist and without polyps.  She does have some erythema about  the left naris.  Buccal mucosa is pink and moist.  Good dental repair.  Pharynx without erythema or exudate.  Tongue is midline.  Uvula has been  removed.  NECK:  Trachea is midline.  No lymphadenopathy.  Carotids are 2+ without  bruits bilaterally.  No tenderness along the cervical spine.  Full range of  motion of the cervical spine.  CARDIAC:  Regular rate and rhythm.  A 3/6 systolic murmur is heard in the  right 2nd intercostal space.  LUNGS:  Lungs clear to auscultation bilaterally without wheezes, rhonchi or  rales.  ABDOMEN:  Abdomen soft, nontender and obese.  Positive bowel sounds heard in  4 quadrants.  BACK:  Back is nontender to palpation over the thoracic and lumbar spine.  BREASTS, GENITOURINARY AND RECTAL:  Exams all deferred at this time.  NEUROLOGIC:  Cranial nerves II-XII are grossly intact.  The patient is alert  and oriented x3.  Upper and lower extremity strength testing reveals 5/5  strength against resistance.  Deep tendon reflexes are 2+ bilaterally  throughout the upper and lower extremities and are equal and symmetric.  MUSCULOSKELETAL:  The patient has full range of motion of the shoulders,  elbows and wrists and digits bilaterally.  Radial pulses 2+ bilaterally.  Lower extremities:  Hips -- bilateral full range of motion.  Left knee -- 0  to 125 to 130 degrees of flexion.  Varus stressing reveals no laxity. McMurray's is negative.  Lachman's is negative.  Palpation along the joint  line reveals no  tenderness.  Right knee -- negative 2 to 115 degrees of  flexion.  Forced flexion is painful.  The patient has pain in the medial  compartment.  Positive edema throughout the knee.  Positive crepitus.  McMurray's is negative.  Valgus/varus stressing reveals no laxity.  Lachman's is negative.  Bilateral lower extremities:  They are non-edematous  and no calf pain.  Dorsal pedal pulses are 2+ bilaterally.   X-RAY FINDINGS:  X-rays obtained on February 23, 2003 of bilateral knees  showed moderate degenerative changes in the medial compartments and  patellofemoral compartments bilaterally.   IMPRESSION:  1. Osteoarthritis of both knees, right pain greater than left.  2. Chronic lymphocytic leukemia.  3. Dyslipidemia.  4. Diabetes mellitus.  5. Hypertension.   PLAN:  The patient is to be admitted to [facility] on July 10, 2003 to undergo a right total knee arthroplasty by Dr. Lenard Galloway. Mortenson.  Patient is to undergo routine labs and testing prior to surgery.      Richardean Canal, P.A.                       Rodney A. Chaney Malling, M.D.    GC/MEDQ  D:  07/02/2003  T:  07/03/2003  Job:  [id number]

## 2010-10-31 NOTE — Op Note (Signed)
NAME:  Carrie Atkins, Carrie Atkins                         ACCOUNT NO.:  [id number]   MEDICAL RECORD NO.:  [id number]                   PATIENT TYPE:  AMB   LOCATION:  DAY                                  [facility]ACILITY[facility]   PHYSICIAN:  Lorre Munroe., M.D.            DATE OF BIRTH:  19-Nov-1933   DATE OF PROCEDURE:  11/02/2003  DATE OF DISCHARGE:                                 OPERATIVE REPORT   PREOPERATIVE DIAGNOSIS:  Symptomatic gallstones.   POSTOPERATIVE DIAGNOSIS:  Symptomatic gallstones.   PROCEDURE:  Laparoscopic cholecystectomy.   SURGEON:  Lebron Conners, M.D.   ASSISTANT:  Anselm Pancoast. Zachery Dakins, M.D.   ANESTHESIA:  General anesthesia.   PROCEDURE IN DETAIL:  After the patient was monitored and anesthetized, and  had routine preparation and draping of the abdomen, I injected local  anesthesia just below the umbilicus and made a 2 mm incision there and  opened the fascia longitudinally in the midline and then bluntly entered the  peritoneal cavity.  I secured a Hasson cannula with 0-Vicryl purse-string  suture in the fascia and inflated the abdomen with CO2.  I then examined the  abdominal contents and saw no abnormality except for slightly distended  gallbladder.  I put in three additional ports through anesthetized sites,  grasped the fundus of the gallbladder and elevated it toward the right  shoulder and pulled the infundibulum over the gallbladder laterally.  I then  dissected the triangle of Fallot making a large window between the liver,  gallbladder, and cystic duct.  Then, after I was certain of the anatomy, I  clipped the cystic duct with four clips, clipped the cystic artery with  three clips and cut each structure between the two closest to the  gallbladder.  I then further dissected the gallbladder from the liver  utilizing the various dissector instruments with cautery and got good  hemostasis with the cautery.  I accidentally made a hole in the gallbladder  and  removed the bile.  I did not see any stones spill out.  After detaching  the gallbladder from the liver, I removed it from the abdomen in a plastic  pouch through the umbilical incision and tied the purse-string suture.  I  then copiously irrigated the gallbladder fossa and subhepatic area, and area  lateral to the liver and removed irrigant.  I checked for good hemostasis  and also checked to see that the clips were secure.  I moved the lateral  ports under direct vision that allowed the CO2 to escape through the  epigastric port and then closed all skin incisions with subcuticular 4-0  Vicryl and Steri-Strips.                                               Lorre Munroe.,  M.D.   Jodi Marble  D:  11/02/2003  T:  11/02/2003  Job:  [id number]   cc:   Windle Guard, M.D.  [address]  [place], [place] [address]  Fax: [phone]

## 2011-01-23 ENCOUNTER — Encounter: Payer: Self-pay | Admitting: Cardiovascular Disease

## 2011-02-02 ENCOUNTER — Other Ambulatory Visit: Payer: Self-pay | Admitting: Cardiovascular Disease

## 2011-02-02 DIAGNOSIS — I10 Essential (primary) hypertension: Secondary | ICD-10-CM

## 2011-02-02 DIAGNOSIS — C9110 Chronic lymphocytic leukemia of B-cell type not having achieved remission: Secondary | ICD-10-CM

## 2011-02-02 DIAGNOSIS — E785 Hyperlipidemia, unspecified: Secondary | ICD-10-CM

## 2011-02-03 ENCOUNTER — Other Ambulatory Visit: Payer: Self-pay | Admitting: *Deleted

## 2011-02-03 ENCOUNTER — Ambulatory Visit (INDEPENDENT_AMBULATORY_CARE_PROVIDER_SITE_OTHER): Payer: Medicare Other | Admitting: Cardiovascular Disease

## 2011-02-03 ENCOUNTER — Ambulatory Visit (INDEPENDENT_AMBULATORY_CARE_PROVIDER_SITE_OTHER): Payer: Medicare Other | Admitting: *Deleted

## 2011-02-03 ENCOUNTER — Encounter: Payer: Self-pay | Admitting: Cardiovascular Disease

## 2011-02-03 DIAGNOSIS — I10 Essential (primary) hypertension: Secondary | ICD-10-CM

## 2011-02-03 DIAGNOSIS — E785 Hyperlipidemia, unspecified: Secondary | ICD-10-CM

## 2011-02-03 DIAGNOSIS — C9110 Chronic lymphocytic leukemia of B-cell type not having achieved remission: Secondary | ICD-10-CM

## 2011-02-03 LAB — LIPID PANEL
Cholesterol: 153 mg/dL (ref 0–200)
HDL: 34.9 mg/dL — ABNORMAL LOW (ref >39.00)
Total CHOL/HDL Ratio: 4
VLDL: 32.6 mg/dL (ref 0.0–40.0)

## 2011-02-03 LAB — HEPATIC FUNCTION PANEL
ALT: 32 U/L (ref 0–35)
AST: 27 U/L (ref 0–37)
Albumin: 4.3 g/dL (ref 3.5–5.2)
Alkaline Phosphatase: 60 U/L (ref 39–117)
Bilirubin, Direct: 0.1 mg/dL (ref 0.0–0.3)
Total Bilirubin: 0.5 mg/dL (ref 0.3–1.2)
Total Protein: 7.4 g/dL (ref 6.0–8.3)

## 2011-02-03 LAB — BASIC METABOLIC PANEL
BUN: 21 mg/dL (ref 6–23)
CO2: 22 mEq/L (ref 19–32)
Chloride: 106 mEq/L (ref 96–112)
Creatinine, Ser: 0.9 mg/dL (ref 0.4–1.2)
GFR: 61.63 mL/min (ref >60.00)
Glucose, Bld: 197 mg/dL — ABNORMAL HIGH (ref 70–99)
Potassium: 4.9 mEq/L (ref 3.5–5.1)
Sodium: 136 mEq/L (ref 135–145)

## 2011-02-03 MED ORDER — NIACIN 250 MG PO TABS: 250.0000 mg | ORAL_TABLET | Freq: Three times a day (TID) | ORAL | Status: DC

## 2011-02-03 MED ORDER — ENALAPRIL MALEATE 2.5 MG PO TABS: 2.5000 mg | ORAL_TABLET | Freq: Every day | ORAL | Status: DC

## 2011-02-03 NOTE — Assessment & Plan Note (Signed)
She has her cholesterol levels drawn by Dr. Jeannetta Nap. He'll continue to follow this problem. We're available to help if needed.

## 2011-02-03 NOTE — Progress Notes (Signed)
Carrie Atkins Date of Birth  [birth year] [facility] [address]     [address] [place], [place]  [address] [phone]  Fax  [phone]  History of Present Illness:  77 year old female with a history of left ventricular outflow tract obstruction. She also has a history of hypertension and hyperlipidemia.  She has mild dyspnea on exertion but otherwise no significant shortness of breath or chest pain.  Current Outpatient Prescriptions  Medication Sig Dispense Refill  . acetaminophen (TYLENOL ARTHRITIS PAIN) 650 MG CR tablet Take 1,300 mg by mouth 2 (two) times daily.        Marland Kitchen amoxicillin (AMOXIL) 500 MG tablet Take 500 mg by mouth as needed.        Marland Kitchen aspirin 81 MG tablet Take 81 mg by mouth daily.        Marland Kitchen CALCIUM-MAGNESIUM-ZINC PO Take by mouth 2 (two) times daily.        . enalapril (VASOTEC) 2.5 MG tablet Take 1 tablet (2.5 mg total) by mouth daily.      . ergocalciferol (VITAMIN D2) 50000 UNITS capsule Take 50,000 Units by mouth once a week.        . fenofibrate micronized (LOFIBRA) 200 MG capsule Take 200 mg by mouth daily before breakfast.        . ferrous sulfate 325 (65 FE) MG tablet Take 325 mg by mouth daily with breakfast.        . fish oil-omega-3 fatty acids 1000 MG capsule Take 1,200 mg by mouth 2 (two) times daily.        Marland Kitchen glimepiride (AMARYL) 1 MG tablet Take 1 mg by mouth 2 (two) times daily.       . metFORMIN (GLUCOPHAGE) 1000 MG tablet Take 1,000 mg by mouth 2 (two) times daily with a meal.        . metoprolol (LOPRESSOR) 50 MG tablet Take 50 mg by mouth 2 (two) times daily.        . niacin 250 MG tablet Take 1 tablet (250 mg total) by mouth 3 (three) times daily with meals.      Marland Kitchen omeprazole (PRILOSEC) 20 MG capsule Take 20 mg by mouth daily.        . pravastatin (PRAVACHOL) 40 MG tablet Take 20 mg by mouth daily.           Allergies  Allergen Reactions  . Hydrocodone   . Sulfonamide Derivatives     Past Medical  History  Diagnosis Date  . Diabetes mellitus   . Hypertension   . Hyperlipidemia   . Chest pain   . Chronic lymphocytic leukemia   . SOB (shortness of breath)     Past Surgical History  Procedure Date  . Total knee arthroplasty   . Tonsillectomy   . Cholecystectomy   . US echocardiography 01/10/2007    EF 55-60%  . US echocardiography 02/23/2006     EF 55-60%  . Cardiovascular stress test 05/29/2010    EF 83%    History  Smoking status  . Never Smoker   Smokeless tobacco  . Not on file    History  Alcohol Use No    Family History  Problem Relation Age of Onset  . Stroke Mother   . Lung cancer Father     Reviw of Systems:  Reviewed in the HPI.  All other systems are negative.  Physical Exam: BP 122/82  Pulse 60  Ht 5' 1.5" (1.562  m)  Wt 185 lb (83.915 kg)  BMI 34.39 kg/m2 The patient is alert and oriented x 3.  The mood and affect are normal.   Skin: warm and dry.  Color is normal.    HEENT:   the sclera are nonicteric.  The mucous membranes are moist.  The carotids are 2+ without bruits.  There is no thyromegaly.  There is no JVD.    Lungs: clear.  The chest wall is non tender.    Heart: regular rate with a normal S1 and S2.  There are no murmurs, gallops, or rubs. The PMI is not displaced.     Abdomen: good bowel sounds.  There is no guarding or rebound.  There is no hepatosplenomegaly or tenderness.  There are no masses.   Extremities:  no clubbing, cyanosis, or edema.  The legs are without rashes.  The distal pulses are intact.   Neuro:  Cranial nerves II - XII are intact.  Motor and sensory functions are intact.    The gait is normal.  ECG:  Assessment / Plan:

## 2011-02-03 NOTE — Assessment & Plan Note (Signed)
Her blood pressure remains well-controlled. We'll continue with her same medications. 

## 2011-02-05 ENCOUNTER — Telehealth: Payer: Self-pay | Admitting: *Deleted

## 2011-02-05 NOTE — Telephone Encounter (Signed)
Message copied by Antony Odea on Thu Feb 05, 2011  3:16 PM ------      Message from: Vesta Mixer      Created: Wed Feb 04, 2011  1:31 PM       [facility]

## 2011-02-05 NOTE — Telephone Encounter (Signed)
Patient called with lab results. Pt verbalized understanding. Had no further questions. Alfonso Ramus RN

## 2011-05-27 ENCOUNTER — Other Ambulatory Visit: Payer: Self-pay | Admitting: Oncology

## 2011-05-27 ENCOUNTER — Other Ambulatory Visit (HOSPITAL_BASED_OUTPATIENT_CLINIC_OR_DEPARTMENT_OTHER): Payer: Medicare Other | Admitting: Lab

## 2011-05-27 DIAGNOSIS — C9110 Chronic lymphocytic leukemia of B-cell type not having achieved remission: Secondary | ICD-10-CM

## 2011-05-27 LAB — CBC & DIFF AND RETIC
BASO%: 0.2 % (ref 0.0–2.0)
Basophils Absolute: 0 10*3/uL (ref 0.0–0.1)
HCT: 36.3 % (ref 34.8–46.6)
LYMPH%: 49.4 % (ref 14.0–49.7)
MCHC: 33.6 g/dL (ref 31.5–36.0)
MONO#: 0.6 10*3/uL (ref 0.1–0.9)
NEUT%: 42.6 % (ref 38.4–76.8)
Platelets: 208 10*3/uL (ref 145–400)
WBC: 11.2 10*3/uL — ABNORMAL HIGH (ref 3.9–10.3)

## 2011-05-28 LAB — BETA 2 MICROGLOBULIN, SERUM: Beta-2 Microglobulin: 1.9 mg/L — ABNORMAL HIGH (ref 1.01–1.73)

## 2011-05-28 LAB — COMPREHENSIVE METABOLIC PANEL
ALT: 27 U/L (ref 0–35)
Albumin: 4.2 g/dL (ref 3.5–5.2)
CO2: 21 mEq/L (ref 19–32)
Chloride: 106 mEq/L (ref 96–112)
Potassium: 5.2 mEq/L (ref 3.5–5.3)
Sodium: 138 mEq/L (ref 135–145)
Total Bilirubin: 0.2 mg/dL — ABNORMAL LOW (ref 0.3–1.2)
Total Protein: 6.5 g/dL (ref 6.0–8.3)

## 2011-05-28 LAB — LACTATE DEHYDROGENASE: LDH: 208 U/L (ref 94–250)

## 2011-06-02 ENCOUNTER — Telehealth: Payer: Self-pay | Admitting: *Deleted

## 2011-06-02 ENCOUNTER — Ambulatory Visit (HOSPITAL_BASED_OUTPATIENT_CLINIC_OR_DEPARTMENT_OTHER): Payer: Medicare Other | Admitting: Oncology

## 2011-06-02 VITALS — BP 153/82 | HR 70 | Temp 97.4°F | Ht 61.5 in | Wt 187.2 lb

## 2011-06-02 DIAGNOSIS — C9110 Chronic lymphocytic leukemia of B-cell type not having achieved remission: Secondary | ICD-10-CM

## 2011-06-02 NOTE — Progress Notes (Signed)
Hematology and Oncology Follow Up Visit  Carrie Atkins [id number] Mar 28, 1934 77 y.o. 06/02/2011 11:12 AM PCP dr Carrie Atkins; Carrie Atkins  Principle Diagnosis: CLL on observation since 2003 Hx of ventricular outflow obst Type 2 dm hypertension  Interim History:  There have been no intercurrent illness, hospitalizations or medication changes.  Medications: I have reviewed the patient's current medications.  Allergies:  Allergies  Allergen Reactions  . Hydrocodone   . Sulfonamide Derivatives     Past Medical History, Surgical history, Social history, and Family History were reviewed and updated.  Review of Systems: Constitutional:  Negative for fever, chills, night sweats, anorexia, weight loss, pain. Cardiovascular: no chest pain or dyspnea on exertion Respiratory: no cough, shortness of breath, or wheezing Neurological: no TIA or stroke symptoms Dermatological: negative ENT: negative Skin Gastrointestinal: no abdominal pain, change in bowel habits, or black or bloody stools Genito-Urinary: no dysuria, trouble voiding, or hematuria Hematological and Lymphatic: negative Breast: negative for breast lumps Musculoskeletal: negative Remaining ROS negative.  Physical Exam: Blood pressure 153/82, pulse 70, temperature 97.4 F (36.3 C), height 5' 1.5" (1.562 m), weight 187 lb 3.2 oz (84.913 kg). ECOG: 0 General appearance: alert, cooperative and appears stated age Head: Normocephalic, without obvious abnormality, atraumatic Neck: no adenopathy, no carotid bruit, no JVD, supple, symmetrical, trachea midline and thyroid not enlarged, symmetric, no tenderness/mass/nodules Lymph nodes: Cervical, supraclavicular, and axillary nodes normal. Cardiac : regular rate and rhythm, no murmurs or gallops Pulmonary:clear to auscultation bilaterally Breasts: inspection negative, no nipple discharge or bleeding, no masses or nodularity palpable Abdomen:soft, non-tender; bowel sounds normal; no  masses,  no organomegaly Extremities negative Neuro: alert, oriented, normal speech, no focal findings or movement disorder noted  Lab Results: Lab Results  Component Value Date   WBC 11.2* 05/27/2011   HGB 12.2 05/27/2011   HCT 36.3 05/27/2011   MCV 94.0 05/27/2011   PLT 208 05/27/2011     Chemistry      Component Value Date/Time   NA 138 05/27/2011 1424   NA 138 05/27/2011 1424   K 5.2 05/27/2011 1424   K 5.2 05/27/2011 1424   CL 106 05/27/2011 1424   CL 106 05/27/2011 1424   CO2 21 05/27/2011 1424   CO2 21 05/27/2011 1424   BUN 20 05/27/2011 1424   BUN 20 05/27/2011 1424   CREATININE 1.01 05/27/2011 1424   CREATININE 1.01 05/27/2011 1424      Component Value Date/Time   CALCIUM 9.2 05/27/2011 1424   CALCIUM 9.2 05/27/2011 1424   ALKPHOS 61 05/27/2011 1424   ALKPHOS 61 05/27/2011 1424   AST 22 05/27/2011 1424   AST 22 05/27/2011 1424   ALT 27 05/27/2011 1424   ALT 27 05/27/2011 1424   BILITOT 0.2* 05/27/2011 1424   BILITOT 0.2* 05/27/2011 1424      .pathology. Radiological Studies: chest X-ray n/a Mammogram n/a Bone density n/a  Impression and Plan: Stage 0 CLL on observation, no problems or intercurrent pblms; f/u 1 yr.  More than 50% of the visit was spent in patient-related counselling   Pierce Crane, MD 12/18/201211:12 AM

## 2011-06-02 NOTE — Telephone Encounter (Signed)
gave patient appointment for 05-2012 printed out calendar and gave to the patient 

## 2012-05-02 ENCOUNTER — Other Ambulatory Visit: Payer: Self-pay | Admitting: Family Medicine

## 2012-05-02 DIAGNOSIS — M79669 Pain in unspecified lower leg: Secondary | ICD-10-CM

## 2012-05-03 ENCOUNTER — Ambulatory Visit
Admission: RE | Admit: 2012-05-03 | Discharge: 2012-05-03 | Disposition: A | Discharge status: Home / Self Care | Payer: Medicare Other | Source: Ambulatory Visit | Attending: Family Medicine | Admitting: Family Medicine

## 2012-05-03 DIAGNOSIS — M79669 Pain in unspecified lower leg: Secondary | ICD-10-CM

## 2012-06-02 ENCOUNTER — Other Ambulatory Visit (HOSPITAL_BASED_OUTPATIENT_CLINIC_OR_DEPARTMENT_OTHER): Payer: Medicare Other | Admitting: Lab

## 2012-06-02 ENCOUNTER — Ambulatory Visit (HOSPITAL_BASED_OUTPATIENT_CLINIC_OR_DEPARTMENT_OTHER): Payer: Medicare Other | Admitting: Oncology

## 2012-06-02 ENCOUNTER — Telehealth: Payer: Self-pay | Admitting: *Deleted

## 2012-06-02 VITALS — BP 179/78 | HR 69 | Temp 97.7°F | Resp 20 | Ht 61.5 in | Wt 196.0 lb

## 2012-06-02 DIAGNOSIS — C9110 Chronic lymphocytic leukemia of B-cell type not having achieved remission: Secondary | ICD-10-CM

## 2012-06-02 LAB — CBC WITH DIFFERENTIAL/PLATELET
BASO%: 0.4 % (ref 0.0–2.0)
EOS%: 2.7 % (ref 0.0–7.0)
HCT: 35.8 % (ref 34.8–46.6)
LYMPH%: 39.5 % (ref 14.0–49.7)
MCH: 31.3 pg (ref 25.1–34.0)
MCHC: 33 g/dL (ref 31.5–36.0)
MONO#: 0.7 10*3/uL (ref 0.1–0.9)
NEUT%: 50.7 % (ref 38.4–76.8)
Platelets: 189 10*3/uL (ref 145–400)
RBC: 3.78 10*6/uL (ref 3.70–5.45)
WBC: 9.8 10*3/uL (ref 3.9–10.3)

## 2012-06-02 LAB — CHCC SMEAR

## 2012-06-02 LAB — HOLD TUBE, BLOOD BANK

## 2012-06-02 LAB — COMPREHENSIVE METABOLIC PANEL (CC13)
ALT: 58 U/L — ABNORMAL HIGH (ref 0–55)
AST: 33 U/L (ref 5–34)
Albumin: 3.4 g/dL — ABNORMAL LOW (ref 3.5–5.0)
Alkaline Phosphatase: 74 U/L (ref 40–150)
Potassium: 4.8 mEq/L (ref 3.5–5.1)
Sodium: 139 mEq/L (ref 136–145)
Total Bilirubin: 0.54 mg/dL (ref 0.20–1.20)
Total Protein: 6.2 g/dL — ABNORMAL LOW (ref 6.4–8.3)

## 2012-06-02 LAB — MORPHOLOGY

## 2012-06-02 LAB — LACTATE DEHYDROGENASE (CC13): LDH: 298 U/L — ABNORMAL HIGH (ref 125–245)

## 2012-06-02 NOTE — Progress Notes (Signed)
Hematology and Oncology Follow Up Visit  Carrie Atkins [id number] 1933/07/01 78 y.o. 06/02/2012 9:35 AM PCP dr Elease Hashimoto; [name]  Principle Diagnosis: CLL on observation since 2003 Hx of ventricular outflow obst Type 2 dm hypertension  Interim History:  She has been diagnosed with gout. Her BP meds have been changed due to cough. She is also dealing with depression and is taking paxil. She is up to date on her mammogram.  Medications: I have reviewed the patient's current medications.  Allergies:  Allergies  Allergen Reactions  . Hydrocodone   . Sulfonamide Derivatives     Past Medical History, Surgical history, Social history, and Family History were reviewed and updated.  Review of Systems: Constitutional:  Negative for fever, chills, night sweats, anorexia, weight loss, pain. Cardiovascular: no chest pain or dyspnea on exertion Respiratory: no cough, shortness of breath, or wheezing Neurological: no TIA or stroke symptoms Dermatological: negative ENT: negative Skin Gastrointestinal: no abdominal pain, change in bowel habits, or black or bloody stools Genito-Urinary: no dysuria, trouble voiding, or hematuria Hematological and Lymphatic: negative Breast: negative for breast lumps Musculoskeletal: negative Remaining ROS negative.  Physical Exam: Blood pressure 179/78, pulse 69, temperature 97.7 F (36.5 C), resp. rate 20, height 5' 1.5" (1.562 m), weight 196 lb (88.905 kg). ECOG: 0 General appearance: alert, cooperative and appears stated age Head: Normocephalic, without obvious abnormality, atraumatic Neck: no adenopathy, no carotid bruit, no JVD, supple, symmetrical, trachea midline and thyroid not enlarged, symmetric, no tenderness/mass/nodules Lymph nodes: Cervical, supraclavicular, and axillary nodes normal. Cardiac : regular rate and rhythm, no murmurs or gallops Pulmonary:clear to auscultation bilaterally Breasts: inspection negative, no nipple discharge  or bleeding, no masses or nodularity palpable Abdomen:soft, non-tender; bowel sounds normal; no masses,  no organomegaly Extremities negative Neuro: alert, oriented, normal speech, no focal findings or movement disorder noted  Lab Results: Lab Results  Component Value Date   WBC 9.8 06/02/2012   HGB 11.8 06/02/2012   HCT 35.8 06/02/2012   MCV 94.8 06/02/2012   PLT 189 06/02/2012     Chemistry      Component Value Date/Time   NA 138 05/27/2011 1424   NA 138 05/27/2011 1424   K 5.2 05/27/2011 1424   K 5.2 05/27/2011 1424   CL 106 05/27/2011 1424   CL 106 05/27/2011 1424   CO2 21 05/27/2011 1424   CO2 21 05/27/2011 1424   BUN 20 05/27/2011 1424   BUN 20 05/27/2011 1424   CREATININE 1.01 05/27/2011 1424   CREATININE 1.01 05/27/2011 1424      Component Value Date/Time   CALCIUM 9.2 05/27/2011 1424   CALCIUM 9.2 05/27/2011 1424   ALKPHOS 61 05/27/2011 1424   ALKPHOS 61 05/27/2011 1424   AST 22 05/27/2011 1424   AST 22 05/27/2011 1424   ALT 27 05/27/2011 1424   ALT 27 05/27/2011 1424   BILITOT 0.2* 05/27/2011 1424   BILITOT 0.2* 05/27/2011 1424      .pathology. Radiological Studies: chest X-ray n/a Mammogram 2013- wnl Bone density n/a  Impression and Plan: Stage 0 CLL on observation, no problems or intercurrent pblms; f/u 1 yr.  More than 50% of the visit was spent in patient-related counselling   Pierce Crane, MD 12/19/20139:35 AM

## 2012-06-02 NOTE — Telephone Encounter (Signed)
Gave patient instructions on receiving her appointment for next year

## 2012-06-03 LAB — IGG, IGA, IGM
IgA: 413 mg/dL — ABNORMAL HIGH (ref 69–380)
IgG (Immunoglobin G), Serum: 606 mg/dL — ABNORMAL LOW (ref 690–1700)

## 2012-06-10 ENCOUNTER — Telehealth: Payer: Self-pay | Admitting: Oncology

## 2012-06-10 NOTE — Telephone Encounter (Signed)
S/w pt today re re-establishing w/JG and gv her appts for 06/06/13 and 06/13/13.

## 2013-05-24 ENCOUNTER — Other Ambulatory Visit: Payer: Self-pay | Admitting: Oncology

## 2013-05-26 ENCOUNTER — Telehealth: Payer: Self-pay | Admitting: Hematology and Oncology

## 2013-05-26 NOTE — Telephone Encounter (Signed)
pt called back and was made aware that she will see NG 12/30 instead of JG. per [facility] request.

## 2013-05-26 NOTE — Telephone Encounter (Signed)
per 12/10 pof moved 12/30 f/u appt for JG to NG @ 1:30pm. lmonvm for pt re appts being changed and gv new d/t but asked that pt call me back directly re the changes being made to her 12/30 appt. also confirmed 12/23 lb appt and mailed schedule

## 2013-06-05 ENCOUNTER — Encounter: Payer: Self-pay | Admitting: Hematology and Oncology

## 2013-06-05 ENCOUNTER — Other Ambulatory Visit: Payer: Self-pay | Admitting: Hematology and Oncology

## 2013-06-05 DIAGNOSIS — C9110 Chronic lymphocytic leukemia of B-cell type not having achieved remission: Secondary | ICD-10-CM

## 2013-06-05 HISTORY — DX: Chronic lymphocytic leukemia of B-cell type not having achieved remission: C91.10

## 2013-06-06 ENCOUNTER — Other Ambulatory Visit (HOSPITAL_BASED_OUTPATIENT_CLINIC_OR_DEPARTMENT_OTHER): Payer: Medicare Other

## 2013-06-06 DIAGNOSIS — C9110 Chronic lymphocytic leukemia of B-cell type not having achieved remission: Secondary | ICD-10-CM

## 2013-06-06 LAB — COMPREHENSIVE METABOLIC PANEL (CC13)
ALT: 27 U/L (ref 0–55)
Alkaline Phosphatase: 57 U/L (ref 40–150)
Anion Gap: 10 mEq/L (ref 3–11)
CO2: 24 mEq/L (ref 22–29)
Creatinine: 1.1 mg/dL (ref 0.6–1.1)
Total Bilirubin: 0.44 mg/dL (ref 0.20–1.20)

## 2013-06-06 LAB — CBC WITH DIFFERENTIAL/PLATELET
BASO%: 0.4 % (ref 0.0–2.0)
EOS%: 2.6 % (ref 0.0–7.0)
HCT: 36.7 % (ref 34.8–46.6)
LYMPH%: 42.6 % (ref 14.0–49.7)
MCH: 32.5 pg (ref 25.1–34.0)
MCHC: 33.9 g/dL (ref 31.5–36.0)
MCV: 95.8 fL (ref 79.5–101.0)
MONO#: 0.7 10*3/uL (ref 0.1–0.9)
MONO%: 6.5 % (ref 0.0–14.0)
NEUT%: 47.9 % (ref 38.4–76.8)
Platelets: 168 10*3/uL (ref 145–400)

## 2013-06-07 LAB — IGG, IGA, IGM
IgG (Immunoglobin G), Serum: 667 mg/dL — ABNORMAL LOW (ref 690–1700)
IgM, Serum: 31 mg/dL — ABNORMAL LOW (ref 52–322)

## 2013-06-13 ENCOUNTER — Ambulatory Visit (HOSPITAL_BASED_OUTPATIENT_CLINIC_OR_DEPARTMENT_OTHER): Payer: Medicare Other | Admitting: Hematology and Oncology

## 2013-06-13 ENCOUNTER — Encounter: Payer: Self-pay | Admitting: Hematology and Oncology

## 2013-06-13 VITALS — BP 154/71 | HR 64 | Temp 97.6°F | Resp 19 | Ht 61.5 in | Wt 195.7 lb

## 2013-06-13 DIAGNOSIS — C9110 Chronic lymphocytic leukemia of B-cell type not having achieved remission: Secondary | ICD-10-CM

## 2013-06-13 NOTE — Progress Notes (Signed)
[facility] OFFICE PROGRESS NOTE  Patient Care Team: Kaleen Mask, MD as PCP - General Artis Delay, MD as Consulting Physician (Hematology and Oncology)  DIAGNOSIS: Rai stage 0 CLL  SUMMARY OF ONCOLOGIC HISTORY: This is a pleasant 79 year old lady who was diagnosed with CLL after presentation with leukocytosis. She is asymptomatic and currently on observation only  INTERVAL HISTORY: Carrie Atkins 79 y.o. female returns for further followup. She had recurrent urinary tract infection once or twice a year. She denies any recent fever, chills, night sweats or abnormal weight loss  I have reviewed the past medical history, past surgical history, social history and family history with the patient and they are unchanged from previous note.  ALLERGIES:  is allergic to hydrocodone and sulfonamide derivatives.  MEDICATIONS:  Current Outpatient Prescriptions  Medication Sig Dispense Refill  . acetaminophen (TYLENOL ARTHRITIS PAIN) 650 MG CR tablet Take 1,300 mg by mouth 2 (two) times daily.        Marland Kitchen aspirin 81 MG tablet Take 81 mg by mouth daily.        Marland Kitchen atorvastatin (LIPITOR) 40 MG tablet Take 40 mg by mouth every evening.      Marland Kitchen CALCIUM-MAGNESIUM-ZINC PO Take by mouth 2 (two) times daily.        . ciprofloxacin (CIPRO) 500 MG tablet Take 500 mg by mouth 2 (two) times daily.      . ergocalciferol (VITAMIN D2) 50000 UNITS capsule Take 50,000 Units by mouth once a week.        . ferrous sulfate 325 (65 FE) MG tablet Take 325 mg by mouth daily with breakfast.        . fish oil-omega-3 fatty acids 1000 MG capsule Take 1,200 mg by mouth 2 (two) times daily.        . furosemide (LASIX) 80 MG tablet Take 40 mg by mouth daily.      Marland Kitchen glimepiride (AMARYL) 1 MG tablet Take 4 mg by mouth 2 (two) times daily.       Marland Kitchen losartan (COZAAR) 100 MG tablet       . metFORMIN (GLUCOPHAGE) 1000 MG tablet Take 1,000 mg by mouth 2 (two) times daily with a meal.        . metoprolol  (LOPRESSOR) 50 MG tablet Take 50 mg by mouth 2 (two) times daily.        . naproxen (NAPROSYN) 500 MG tablet       . naproxen sodium (ANAPROX) 220 MG tablet Take 220 mg by mouth 2 (two) times daily with a meal.      . omeprazole (PRILOSEC) 20 MG capsule Take 20 mg by mouth daily.        Marland Kitchen PARoxetine (PAXIL) 20 MG tablet       . amoxicillin (AMOXIL) 500 MG tablet Take 500 mg by mouth as needed (Dental work).        No current facility-administered medications for this visit.    REVIEW OF SYSTEMS:   Constitutional: Denies fevers, chills or abnormal weight loss Eyes: Denies blurriness of vision Ears, nose, mouth, throat, and face: Denies mucositis or sore throat Respiratory: Denies cough, dyspnea or wheezes Cardiovascular: Denies palpitation, chest discomfort or lower extremity swelling Gastrointestinal:  Denies nausea, heartburn or change in bowel habits Skin: Denies abnormal skin rashes Lymphatics: Denies new lymphadenopathy or easy bruising Neurological:Denies numbness, tingling or new weaknesses Behavioral/Psych: Mood is stable, no new changes  All other systems were reviewed with the patient and are  negative.  PHYSICAL EXAMINATION: ECOG PERFORMANCE STATUS: 0 - Asymptomatic  Filed Vitals:   06/13/13 1322  BP: 154/71  Pulse: 64  Temp: 97.6 F (36.4 C)  Resp: 19   Filed Weights   06/13/13 1322  Weight: 195 lb 11.2 oz (88.769 kg)    GENERAL:alert, no distress and comfortable. She is morbidly obese SKIN: skin color, texture, turgor are normal, no rashes or significant lesions EYES: normal, Conjunctiva are pink and non-injected, sclera clear OROPHARYNX:no exudate, no erythema and lips, buccal mucosa, and tongue normal  NECK: supple, thyroid normal size, non-tender, without nodularity LYMPH:  no palpable lymphadenopathy in the cervical, axillary or inguinal LUNGS: clear to auscultation and percussion with normal breathing effort HEART: regular rate & rhythm and no murmurs and  no lower extremity edema ABDOMEN:abdomen soft, non-tender and normal bowel sounds no palpable splenomegaly Musculoskeletal:no cyanosis of digits and no clubbing  NEURO: alert & oriented x 3 with fluent speech, no focal motor/sensory deficits  LABORATORY DATA:  I have reviewed the data as listed    Component Value Date/Time   NA 141 06/06/2013 0937   NA 138 05/27/2011 1424   K 4.6 06/06/2013 0937   K 5.2 05/27/2011 1424   CL 109* 06/02/2012 0816   CL 106 05/27/2011 1424   CO2 24 06/06/2013 0937   CO2 21 05/27/2011 1424   GLUCOSE 174* 06/06/2013 0937   GLUCOSE 188* 06/02/2012 0816   GLUCOSE 234* 05/27/2011 1424   BUN 23.8 06/06/2013 0937   BUN 20 05/27/2011 1424   CREATININE 1.1 06/06/2013 0937   CREATININE 1.01 05/27/2011 1424   CALCIUM 9.1 06/06/2013 0937   CALCIUM 9.2 05/27/2011 1424   PROT 6.6 06/06/2013 0937   PROT 6.5 05/27/2011 1424   ALBUMIN 3.3* 06/06/2013 0937   ALBUMIN 4.2 05/27/2011 1424   AST 22 06/06/2013 0937   AST 22 05/27/2011 1424   ALT 27 06/06/2013 0937   ALT 27 05/27/2011 1424   ALKPHOS 57 06/06/2013 0937   ALKPHOS 61 05/27/2011 1424   BILITOT 0.44 06/06/2013 0937   BILITOT 0.2* 05/27/2011 1424    No results found for this basename: SPEP, UPEP,  kappa and lambda light chains    Lab Results  Component Value Date   WBC 10.5* 06/06/2013   NEUTROABS 5.0 06/06/2013   HGB 12.4 06/06/2013   HCT 36.7 06/06/2013   MCV 95.8 06/06/2013   PLT 168 06/06/2013      Chemistry      Component Value Date/Time   NA 141 06/06/2013 0937   NA 138 05/27/2011 1424   K 4.6 06/06/2013 0937   K 5.2 05/27/2011 1424   CL 109* 06/02/2012 0816   CL 106 05/27/2011 1424   CO2 24 06/06/2013 0937   CO2 21 05/27/2011 1424   BUN 23.8 06/06/2013 0937   BUN 20 05/27/2011 1424   CREATININE 1.1 06/06/2013 0937   CREATININE 1.01 05/27/2011 1424      Component Value Date/Time   CALCIUM 9.1 06/06/2013 0937   CALCIUM 9.2 05/27/2011 1424   ALKPHOS 57 06/06/2013 0937    ALKPHOS 61 05/27/2011 1424   AST 22 06/06/2013 0937   AST 22 05/27/2011 1424   ALT 27 06/06/2013 0937   ALT 27 05/27/2011 1424   BILITOT 0.44 06/06/2013 0937   BILITOT 0.2* 05/27/2011 1424     ASSESSMENT & PLAN:  #1 Rai stage 0 CLL Her leukocytosis is stable. Recommend observation only #2 elevated IgA level There were no detectable M spike. I  will observe closely. The level of her IgA level has not changed over the past 5 years.  Orders Placed This Encounter  Procedures  . Comprehensive metabolic panel    Standing Status: Future     Number of Occurrences:      Standing Expiration Date: 06/13/2014  . Lactate dehydrogenase    Standing Status: Future     Number of Occurrences:      Standing Expiration Date: 06/13/2014  . CBC with Differential    Standing Status: Future     Number of Occurrences:      Standing Expiration Date: 03/05/2014  . SPEP & IFE with QIG    Standing Status: Future     Number of Occurrences:      Standing Expiration Date: 06/13/2014  . Kappa/lambda light chains    Standing Status: Future     Number of Occurrences:      Standing Expiration Date: 06/13/2014   All questions were answered. The patient knows to call the clinic with any problems, questions or concerns. No barriers to learning was detected. I spent 15 minutes counseling the patient face to face. The total time spent in the appointment was 20 minutes and more than 50% was on counseling and review of test results     [facility], Karn Derk, MD 06/13/2013 1:41 PM

## 2013-06-14 ENCOUNTER — Telehealth: Payer: Self-pay | Admitting: Hematology and Oncology

## 2013-06-19 ENCOUNTER — Encounter: Payer: Self-pay | Admitting: Cardiovascular Disease

## 2013-06-28 ENCOUNTER — Ambulatory Visit: Payer: Medicare Other | Admitting: Cardiovascular Disease

## 2013-07-14 ENCOUNTER — Ambulatory Visit (INDEPENDENT_AMBULATORY_CARE_PROVIDER_SITE_OTHER): Payer: Medicare Other | Admitting: Cardiovascular Disease

## 2013-07-14 ENCOUNTER — Encounter: Payer: Self-pay | Admitting: Cardiovascular Disease

## 2013-07-14 VITALS — BP 170/100 | HR 59 | Ht 61.5 in | Wt 189.0 lb

## 2013-07-14 DIAGNOSIS — I10 Essential (primary) hypertension: Secondary | ICD-10-CM

## 2013-07-14 DIAGNOSIS — R0989 Other specified symptoms and signs involving the circulatory and respiratory systems: Secondary | ICD-10-CM

## 2013-07-14 DIAGNOSIS — R0602 Shortness of breath: Secondary | ICD-10-CM | POA: Insufficient documentation

## 2013-07-14 DIAGNOSIS — R0600 Dyspnea, unspecified: Secondary | ICD-10-CM

## 2013-07-14 DIAGNOSIS — R0609 Other forms of dyspnea: Secondary | ICD-10-CM

## 2013-07-14 DIAGNOSIS — E785 Hyperlipidemia, unspecified: Secondary | ICD-10-CM

## 2013-07-14 MED ORDER — VALSARTAN 320 MG PO TABS: 320.0000 mg | ORAL_TABLET | Freq: Every day | ORAL | Status: DC

## 2013-07-14 MED ORDER — CARVEDILOL 25 MG PO TABS: 25.0000 mg | ORAL_TABLET | Freq: Two times a day (BID) | ORAL | Status: DC

## 2013-07-14 NOTE — Patient Instructions (Addendum)
Your physician has requested that you have an echocardiogram. Echocardiography is a painless test that uses sound waves to create images of your heart. It provides your doctor with information about the size and shape of your heart and how well your heart's chambers and valves are working. This procedure takes approximately one hour. There are no restrictions for this procedure.  Your physician recommends that you schedule a follow-up appointment in: [place] Your physician recommends that you return for lab work in: [place] //BMET   Your physician has recommended you make the following change in your medication:  STOP METOPROLOL START COREG/ CARVEDILOL 25 MG TWICE DAILY 12 HOURS APART STOP COZAAR  START DIOVAN [address] [place], GRAVY, SAUCES, READY PREPARED FOODS [place]; LEAN CUISINE, LASAGNA. BACON, SAUSAGE, LUNCH MEAT, FAST FOODS, HOT DOGS, CHIPS, PIZZA, CHINESE FOOD, SOY SAUCE, STORE BOUGHT FRIED CHICKEN= [place] FRIED CHICKEN/ BOJANGLES.

## 2013-07-14 NOTE — Assessment & Plan Note (Signed)
Carrell presents with worsening shortness of breath with exertion. It is difficult to assess but she does not get any regular exercise and in fact she is so limited from an orthopedic standpoint that she really has major difficulty doing anything.  She does not have a significant murmur on exam today so I do not think that she's had any worsening of her mild LVOT obstruction.  Her blood pressure is elevated. She may be a little bit of extra salt. We will encourage her to eat less salt. We will discontinue the metoprolol and will start her on carvedilol 25 mg twice a day. We will discontinue the losartan and start her on Diovan 320 mg a day which should be strong blood pressure medicine for her. We'll get an echocardiogram. I'll see her again in 3 months for followup office visit and basic metabolic profile.

## 2013-07-14 NOTE — Progress Notes (Signed)
Carrie Atkins Date of Birth  [birth year] [facility] [address] [place], [place]  [address] [phone]  Fax  [phone]  History of Present Illness:  80 year old female with a history of left ventricular outflow tract obstruction. She also has a history of hypertension and hyperlipidemia.  She has mild dyspnea on exertion but otherwise no significant shortness of breath or chest pain.  Jan. 30, 2015:  Carrie Atkins is seen after a 2 1/2 year absence.  Her BP readings have been better that today.  She has had some worsening dyspnea.  Has DOE.  Has to sit down to rest after doing any type of exertion. She ran out of energy while shopping at [place] several weeks ago.  She denies any angina-like chest pain. She has occasional nonspecific atypical pains. These pains are not associated with exertion.  Current Outpatient Prescriptions  Medication Sig Dispense Refill  . acetaminophen (TYLENOL ARTHRITIS PAIN) 650 MG CR tablet Take 1,300 mg by mouth 2 (two) times daily.        Marland Kitchen amoxicillin (AMOXIL) 500 MG tablet Take 500 mg by mouth as needed (Dental work).       Marland Kitchen aspirin 81 MG tablet Take 81 mg by mouth daily.        Marland Kitchen atorvastatin (LIPITOR) 40 MG tablet Take 40 mg by mouth every evening.      Marland Kitchen CALCIUM-MAGNESIUM-ZINC PO Take by mouth 2 (two) times daily.        . ergocalciferol (VITAMIN D2) 50000 UNITS capsule Take 50,000 Units by mouth once a week.        . ferrous sulfate 325 (65 FE) MG tablet Take 325 mg by mouth daily with breakfast.        . fish oil-omega-3 fatty acids 1000 MG capsule Take 1,200 mg by mouth 2 (two) times daily.        . furosemide (LASIX) 80 MG tablet Take 40 mg by mouth daily.      Marland Kitchen glimepiride (AMARYL) 1 MG tablet Take 4 mg by mouth 2 (two) times daily.       Marland Kitchen losartan (COZAAR) 100 MG tablet       . metFORMIN (GLUCOPHAGE) 1000 MG tablet Take 1,000 mg by mouth 2 (two) times daily with a meal.        .  metoprolol (LOPRESSOR) 50 MG tablet Take 50 mg by mouth 2 (two) times daily.        . naproxen (NAPROSYN) 500 MG tablet       . naproxen sodium (ANAPROX) 220 MG tablet Take 220 mg by mouth 2 (two) times daily with a meal.      . nitrofurantoin, macrocrystal-monohydrate, (MACROBID) 100 MG capsule       . omeprazole (PRILOSEC) 20 MG capsule Take 20 mg by mouth daily.        Marland Kitchen PARoxetine (PAXIL) 20 MG tablet        No current facility-administered medications for this visit.      Allergies  Allergen Reactions  . Hydrocodone   . Sulfonamide Derivatives     Past Medical History  Diagnosis Date  . Diabetes mellitus   . Hypertension   . Hyperlipidemia   . Chest pain   . Chronic lymphocytic leukemia   . SOB (shortness of breath)   . CLL (chronic lymphocytic leukemia) 06/05/2013    Past Surgical History  Procedure Laterality Date  . Total knee arthroplasty    .  Tonsillectomy    . Cholecystectomy    . US echocardiography  01/10/2007    EF 55-60%  . US echocardiography  02/23/2006     EF 55-60%  . Cardiovascular stress test  05/29/2010    EF 83%    History  Smoking status  . Never Smoker   Smokeless tobacco  . Never Used    History  Alcohol Use No    Family History  Problem Relation Age of Onset  . Stroke Mother   . Lung cancer Father     Reviw of Systems:  Reviewed in the HPI.  All other systems are negative.  Physical Exam: BP 170/100  Pulse 59  Ht 5' 1.5" (1.562 m)  Wt 189 lb (85.73 kg)  BMI 35.14 kg/m2 The patient is alert and oriented x 3.  The mood and affect are normal.   Skin: warm and dry.  Color is normal.    HEENT:   the sclera are nonicteric.  The mucous membranes are moist.  The carotids are 2+ without bruits.  There is no thyromegaly.  There is no JVD.    Lungs: clear.  The chest wall is non tender.    Heart: regular rate with a normal S1 and S2.   She has no significant murmurs The PMI is not displaced.     Abdomen: good bowel sounds.   There is no guarding or rebound.  There is no hepatosplenomegaly or tenderness.  There are no masses.   Extremities:  no clubbing, cyanosis, .  There is trace leg edema.  The legs are without rashes.  The distal pulses are intact.   Neuro:  Cranial nerves II - XII are intact.  Motor and sensory functions are intact.    The gait is very slow.  She had lots of trouble getting up in the exam table.    ECG: 07/14/2013: Sinus bradycardia at 59 beats a minute. She has no ST or T wave changes.  Assessment / Plan:

## 2013-07-17 ENCOUNTER — Ambulatory Visit (HOSPITAL_COMMUNITY): Payer: Medicare Other | Attending: Cardiovascular Disease | Admitting: Radiology

## 2013-07-17 ENCOUNTER — Encounter: Payer: Self-pay | Admitting: Cardiovascular Disease

## 2013-07-17 DIAGNOSIS — I059 Rheumatic mitral valve disease, unspecified: Secondary | ICD-10-CM | POA: Insufficient documentation

## 2013-07-17 DIAGNOSIS — R0600 Dyspnea, unspecified: Secondary | ICD-10-CM

## 2013-07-17 DIAGNOSIS — E119 Type 2 diabetes mellitus without complications: Secondary | ICD-10-CM | POA: Insufficient documentation

## 2013-07-17 DIAGNOSIS — I10 Essential (primary) hypertension: Secondary | ICD-10-CM

## 2013-07-17 DIAGNOSIS — E669 Obesity, unspecified: Secondary | ICD-10-CM | POA: Insufficient documentation

## 2013-07-17 DIAGNOSIS — E785 Hyperlipidemia, unspecified: Secondary | ICD-10-CM | POA: Insufficient documentation

## 2013-07-17 DIAGNOSIS — R0602 Shortness of breath: Secondary | ICD-10-CM | POA: Insufficient documentation

## 2013-07-17 DIAGNOSIS — I079 Rheumatic tricuspid valve disease, unspecified: Secondary | ICD-10-CM | POA: Insufficient documentation

## 2013-07-17 DIAGNOSIS — I2789 Other specified pulmonary heart diseases: Secondary | ICD-10-CM | POA: Insufficient documentation

## 2013-07-17 NOTE — Progress Notes (Signed)
Echocardiogram performed.  

## 2013-07-18 NOTE — Assessment & Plan Note (Addendum)
Her blood pressure continues to be elevated. We'll discontinue the metoprolol to try carvedilol 25 mg BID. We'll also discontinue the losartan start generic Diovan 320 mg a day.  I will see her in 3 months.

## 2013-07-27 ENCOUNTER — Ambulatory Visit: Payer: Medicare Other | Admitting: Cardiovascular Disease

## 2013-07-28 ENCOUNTER — Telehealth: Payer: Self-pay | Admitting: Cardiovascular Disease

## 2013-07-28 NOTE — Telephone Encounter (Signed)
New message    Need to verify that pt has CHF and also need her ejection fraction.

## 2013-07-28 NOTE — Telephone Encounter (Signed)
EF 60% and no diagnosis of CHF seen, advised Janett Billow

## 2013-10-17 ENCOUNTER — Encounter: Payer: Self-pay | Admitting: Cardiovascular Disease

## 2013-10-17 ENCOUNTER — Ambulatory Visit (INDEPENDENT_AMBULATORY_CARE_PROVIDER_SITE_OTHER): Payer: Medicare Other | Admitting: Cardiovascular Disease

## 2013-10-17 VITALS — BP 146/80 | HR 88 | Ht 61.0 in | Wt 187.0 lb

## 2013-10-17 DIAGNOSIS — E785 Hyperlipidemia, unspecified: Secondary | ICD-10-CM

## 2013-10-17 DIAGNOSIS — I10 Essential (primary) hypertension: Secondary | ICD-10-CM

## 2013-10-17 NOTE — Assessment & Plan Note (Signed)
Managed by Dr. Arelia Sneddon

## 2013-10-17 NOTE — Assessment & Plan Note (Signed)
BP is doing ok.  She is not able to exercise much because  Of her scitaica.   Continue  Current meds

## 2013-10-17 NOTE — Progress Notes (Signed)
Carrie Atkins Date of Birth  1934/01/11 [facility] [address] [place], [place]  [address] [phone]  Fax  [phone]  History of Present Illness:  80 year old female with a history of left ventricular outflow tract obstruction. She also has a history of hypertension and hyperlipidemia.  She has mild dyspnea on exertion but otherwise no significant shortness of breath or chest pain.  Jan. 30, 2015:  Carrie Atkins is seen after a 2 1/2 year absence.  Her BP readings have been better that today.  She has had some worsening dyspnea.  Has DOE.  Has to sit down to rest after doing any type of exertion. She ran out of energy while shopping at [place] several weeks ago.  She denies any angina-like chest pain. She has occasional nonspecific atypical pains. These pains are not associated with exertion.  Oct 17, 2013:  Carrie Atkins is doing ok.  Still has some dyspnea.  No CP.  Limited by her sciatica pain more than anything.   Current Outpatient Prescriptions  Medication Sig Dispense Refill  . acetaminophen (TYLENOL ARTHRITIS PAIN) 650 MG CR tablet Take 1,300 mg by mouth 2 (two) times daily.        Marland Kitchen amoxicillin (AMOXIL) 500 MG tablet Take 500 mg by mouth as needed (Dental work).       Marland Kitchen aspirin 81 MG tablet Take 81 mg by mouth daily.        Marland Kitchen atorvastatin (LIPITOR) 40 MG tablet Take 40 mg by mouth every evening.      Marland Kitchen CALCIUM-MAGNESIUM-ZINC PO Take by mouth 2 (two) times daily.        . carvedilol (COREG) 25 MG tablet Take 1 tablet (25 mg total) by mouth 2 (two) times daily.  180 tablet  3  . ergocalciferol (VITAMIN D2) 50000 UNITS capsule Take 50,000 Units by mouth once a week.        . ferrous sulfate 325 (65 FE) MG tablet Take 325 mg by mouth daily with breakfast.        . fish oil-omega-3 fatty acids 1000 MG capsule Take 1,200 mg by mouth 2 (two) times daily.        . furosemide (LASIX) 80 MG tablet Take 40 mg by mouth daily.      Marland Kitchen  glimepiride (AMARYL) 1 MG tablet Take 4 mg by mouth 2 (two) times daily.       . metFORMIN (GLUCOPHAGE) 1000 MG tablet Take 1,000 mg by mouth 2 (two) times daily with a meal.        . naproxen sodium (ANAPROX) 220 MG tablet Take 220 mg by mouth 2 (two) times daily as needed.       Marland Kitchen PARoxetine (PAXIL) 20 MG tablet       . ranitidine (ZANTAC) 150 MG tablet Take 150 mg by mouth 2 (two) times daily as needed for heartburn.      . sitaGLIPtin (JANUVIA) 100 MG tablet Take 100 mg by mouth daily.      . valsartan (DIOVAN) 320 MG tablet Take 1 tablet (320 mg total) by mouth daily.  90 tablet  3  . naproxen (NAPROSYN) 500 MG tablet       . nitrofurantoin, macrocrystal-monohydrate, (MACROBID) 100 MG capsule       . omeprazole (PRILOSEC) 20 MG capsule Take 20 mg by mouth daily.         No current facility-administered medications for this visit.  Allergies  Allergen Reactions  . Hydrocodone   . Sulfonamide Derivatives     Past Medical History  Diagnosis Date  . Diabetes mellitus   . Hypertension   . Hyperlipidemia   . Chest pain   . Chronic lymphocytic leukemia   . SOB (shortness of breath)   . CLL (chronic lymphocytic leukemia) 06/05/2013    Past Surgical History  Procedure Laterality Date  . Total knee arthroplasty    . Tonsillectomy    . Cholecystectomy    . US echocardiography  01/10/2007    EF 55-60%  . US echocardiography  02/23/2006     EF 55-60%  . Cardiovascular stress test  05/29/2010    EF 83%    History  Smoking status  . Never Smoker   Smokeless tobacco  . Never Used    History  Alcohol Use No    Family History  Problem Relation Age of Onset  . Stroke Mother   . Lung cancer Father     Reviw of Systems:  Reviewed in the HPI.  All other systems are negative.  Physical Exam: BP 146/80  Pulse 88  Ht 5\' 1"  (1.549 m)  Wt 187 lb (84.823 kg)  BMI 35.35 kg/m2 The patient is alert and oriented x 3.  The mood and affect are normal.   Skin: warm and  dry.  Color is normal.    HEENT:   the sclera are nonicteric.  The mucous membranes are moist.  The carotids are 2+ without bruits.  There is no thyromegaly.  There is no JVD.    Lungs: clear.  The chest wall is non tender.    Heart: regular rate with a normal S1 and S2.   She has no significant murmurs The PMI is not displaced.     Abdomen: good bowel sounds.  There is no guarding or rebound.  There is no hepatosplenomegaly or tenderness.  There are no masses.   Extremities:  no clubbing, cyanosis, .  There is trace leg edema.  The legs are without rashes.  The distal pulses are intact.   Neuro:  Cranial nerves II - XII are intact.  Motor and sensory functions are intact.    The gait is very slow.  She had lots of trouble getting up in the exam table.   ECG:  Assessment / Plan:

## 2013-10-17 NOTE — Patient Instructions (Signed)
Continue same medications.   Your physician wants you to follow-up in: 1 year.  You will receive a reminder letter in the mail two months in advance. If you don't receive a letter, please call our office to schedule the follow-up appointment.  

## 2014-04-16 ENCOUNTER — Other Ambulatory Visit: Payer: Self-pay | Admitting: Cardiovascular Disease

## 2014-05-27 ENCOUNTER — Other Ambulatory Visit: Payer: Self-pay | Admitting: Hematology and Oncology

## 2014-05-27 DIAGNOSIS — C9110 Chronic lymphocytic leukemia of B-cell type not having achieved remission: Secondary | ICD-10-CM

## 2014-05-28 ENCOUNTER — Ambulatory Visit: Payer: Medicare Other | Admitting: Hematology and Oncology

## 2014-05-28 ENCOUNTER — Other Ambulatory Visit: Payer: Medicare Other

## 2014-07-12 ENCOUNTER — Other Ambulatory Visit: Payer: Self-pay | Admitting: Cardiovascular Disease

## 2014-10-18 ENCOUNTER — Encounter: Payer: Self-pay | Admitting: Cardiovascular Disease

## 2014-10-18 ENCOUNTER — Ambulatory Visit (INDEPENDENT_AMBULATORY_CARE_PROVIDER_SITE_OTHER): Payer: Medicare Other | Admitting: Cardiovascular Disease

## 2014-10-18 VITALS — BP 150/80 | HR 62 | Ht 61.0 in | Wt 225.6 lb

## 2014-10-18 DIAGNOSIS — I517 Cardiomegaly: Secondary | ICD-10-CM

## 2014-10-18 DIAGNOSIS — I10 Essential (primary) hypertension: Secondary | ICD-10-CM | POA: Diagnosis not present

## 2014-10-18 NOTE — Patient Instructions (Signed)
Medication Instructions:  Your physician recommends that you continue on your current medications as directed. Please refer to the Current Medication list given to you today.   Labwork: None  Testing/Procedures: None  Follow-Up: Your physician recommends that you schedule a follow-up appointment in: as needed with Dr. Nahser      

## 2014-10-18 NOTE — Progress Notes (Signed)
Cardiology Office Note   Date:  10/18/2014   ID:  Carrie Atkins, DOB May 12, 1934, MRN [id number]  PCP:  Leonard Downing, MD  Cardiologist:   Thayer Headings, MD   Chief Complaint  Patient presents with  . Hypertension   81 year old female with a history of left ventricular outflow tract obstruction. She also has a history of hypertension and hyperlipidemia. She has mild dyspnea on exertion but otherwise no significant shortness of breath or chest pain.  Jan. 30, 2015:  [name] is seen after a 2 1/2 year absence. Her BP readings have been better that today. She has had some worsening dyspnea. Has DOE. Has to sit down to rest after doing any type of exertion. She ran out of energy while shopping at [employer] several weeks ago.  She denies any angina-like chest pain. She has occasional nonspecific atypical pains. These pains are not associated with exertion.   Oct 18, 2014:  Carrie Atkins is a 81 y.o. female who presents for  Her HTN and HCM. She has been falling recently .  Has poor balance when walking.  Is very weak.  No CP , no dyspnea  No walking much due to balance issues.  Has had imbalance for the past year , has fallen 3 times this year.    Past Medical History  Diagnosis Date  . Diabetes mellitus   . Hypertension   . Hyperlipidemia   . Chest pain   . Chronic lymphocytic leukemia   . SOB (shortness of breath)   . CLL (chronic lymphocytic leukemia) 06/05/2013    Past Surgical History  Procedure Laterality Date  . Total knee arthroplasty    . Tonsillectomy    . Cholecystectomy    . US echocardiography  01/10/2007    EF 55-60%  . US echocardiography  02/23/2006     EF 55-60%  . Cardiovascular stress test  05/29/2010    EF 83%     Current Outpatient Prescriptions  Medication Sig Dispense Refill  . acetaminophen (TYLENOL ARTHRITIS PAIN) 650 MG CR tablet Take 1,300 mg by mouth 2 (two) times daily.      Marland Kitchen aspirin 81 MG tablet Take 81 mg by mouth daily.       Marland Kitchen atorvastatin (LIPITOR) 40 MG tablet Take 40 mg by mouth every evening.    Marland Kitchen CALCIUM-MAGNESIUM-ZINC PO Take by mouth 2 (two) times daily.      . carvedilol (COREG) 25 MG tablet TAKE ONE TABLET BY MOUTH TWICE DAILY 180 tablet 1  . ergocalciferol (VITAMIN D2) 50000 UNITS capsule Take 50,000 Units by mouth once a week.      . ferrous sulfate 325 (65 FE) MG tablet Take 325 mg by mouth daily with breakfast.      . fish oil-omega-3 fatty acids 1000 MG capsule Take 1,200 mg by mouth 2 (two) times daily.      . furosemide (LASIX) 80 MG tablet Take 40 mg by mouth daily.    Marland Kitchen glimepiride (AMARYL) 1 MG tablet Take 4 mg by mouth 2 (two) times daily.     . naproxen (NAPROSYN) 500 MG tablet Take 500 mg by mouth 2 (two) times daily with a meal.     . naproxen sodium (ANAPROX) 220 MG tablet Take 220 mg by mouth 2 (two) times daily as needed.     Marland Kitchen omeprazole (PRILOSEC) 20 MG capsule Take 20 mg by mouth daily.      Marland Kitchen PARoxetine (PAXIL) 20 MG tablet Take 20  mg by mouth daily.     . pioglitazone (ACTOS) 30 MG tablet Take 30 mg by mouth 2 (two) times daily.    . ranitidine (ZANTAC) 150 MG tablet Take 150 mg by mouth 2 (two) times daily as needed for heartburn.    . sitaGLIPtin (JANUVIA) 100 MG tablet Take 100 mg by mouth daily.    . valsartan (DIOVAN) 320 MG tablet TAKE 1 TABLET BY MOUTH DAILY 90 tablet 1  . amoxicillin (AMOXIL) 500 MG tablet Take 500 mg by mouth as needed (Dental work).      No current facility-administered medications for this visit.    Allergies:   Hydrocodone and Sulfonamide derivatives    Social History:  The patient  reports that she has never smoked. She has never used smokeless tobacco. She reports that she does not drink alcohol or use illicit drugs.   Family History:  The patient's family history includes Lung cancer in her father; Stroke in her mother.    ROS:  Please see the history of present illness.    Review of Systems: Constitutional:  denies fever, chills,  diaphoresis, appetite change and fatigue.  HEENT: denies photophobia, eye pain, redness, hearing loss, ear pain, congestion, sore throat, rhinorrhea, sneezing, neck pain, neck stiffness and tinnitus.  Respiratory: denies SOB, DOE, cough, chest tightness, and wheezing.  Cardiovascular: denies chest pain, palpitations and leg swelling.  Gastrointestinal: denies nausea, vomiting, abdominal pain, diarrhea, constipation, blood in stool.  Genitourinary: denies dysuria, urgency, frequency, hematuria, flank pain and difficulty urinating.  Musculoskeletal: denies  myalgias, back pain, joint swelling, arthralgias and gait problem.   Skin: denies pallor, rash and wound.  Neurological: denies dizziness, seizures, syncope, weakness, light-headedness, numbness and headaches.   Hematological: denies adenopathy, easy bruising, personal or family bleeding history.  Psychiatric/ Behavioral: denies suicidal ideation, mood changes, confusion, nervousness, sleep disturbance and agitation.       All other systems are reviewed and negative.    PHYSICAL EXAM: VS:  BP 150/80 mmHg  Pulse 62  Ht 5\' 1"  (1.549 m)  Wt 225 lb 9.6 oz (102.331 kg)  BMI 42.65 kg/m2 , BMI Body mass index is 42.65 kg/(m^2). GEN: Well nourished, well developed, in no acute distress HEENT: normal Neck: no JVD, carotid bruits, or masses Cardiac: RRR; no murmurs, rubs, or gallops,no edema  Respiratory:  clear to auscultation bilaterally, normal work of breathing GI: soft, nontender, nondistended, + BS MS: no deformity or atrophy Skin: warm and dry, no rash Neuro:  Strength and sensation are intact Psych: normal   EKG:  EKG is ordered today. The ekg ordered today demonstrates NSR at 62. No ST or T wave changes   Recent Labs: No results found for requested labs within last 365 days.    Lipid Panel    Component Value Date/Time   CHOL 153 02/03/2011 0847   TRIG 163.0* 02/03/2011 0847   HDL 34.90* 02/03/2011 0847   CHOLHDL 4  02/03/2011 0847   VLDL 32.6 02/03/2011 0847   LDLCALC 86 02/03/2011 0847      Wt Readings from Last 3 Encounters:  10/18/14 225 lb 9.6 oz (102.331 kg)  10/17/13 187 lb (84.823 kg)  07/14/13 189 lb (85.73 kg)      Other studies Reviewed: Additional studies/ records that were reviewed today include: . Review of the above records demonstrates:   ASSESSMENT AND PLAN:  1.  Hypertension: Her blood pressures fairly well-controlled. She needs to work on weight loss program and also needs to watch  her diet. I will have her follow-up with Dr. Arelia Sneddon for further  2. Hyperlipidemia: Stable, follow-up with Dr. Arelia Sneddon.  3. Left ventricular hypertrophy: She has a history of mild LVH. There is no clinical  evidence of dynamic obstruction. She'll follow-up with Dr. Arelia Sneddon and can see me as needed.   Current medicines are reviewed at length with the patient today.  The patient does not have concerns regarding medicines.  The following changes have been made:  no change  Labs/ tests ordered today include:  No orders of the defined types were placed in this encounter.     Disposition:   FU with me as needed.      Shannen Carrie, Wonda Cheng, MD  10/18/2014 11:26 AM    [facility] [place], [place], [place]  [address] Phone: [phone]; Fax: [phone]   [facility]  [address] [place], [place]  [address] [phone]    Fax [phone]

## 2014-10-24 ENCOUNTER — Ambulatory Visit (INDEPENDENT_AMBULATORY_CARE_PROVIDER_SITE_OTHER): Payer: Medicare Other | Admitting: Diagnostic Neuroimaging

## 2014-10-24 ENCOUNTER — Encounter: Payer: Self-pay | Admitting: Diagnostic Neuroimaging

## 2014-10-24 VITALS — BP 128/72 | HR 60 | Ht 61.0 in | Wt 223.0 lb

## 2014-10-24 DIAGNOSIS — R292 Abnormal reflex: Secondary | ICD-10-CM | POA: Diagnosis not present

## 2014-10-24 DIAGNOSIS — R269 Unspecified abnormalities of gait and mobility: Secondary | ICD-10-CM

## 2014-10-24 DIAGNOSIS — R29898 Other symptoms and signs involving the musculoskeletal system: Secondary | ICD-10-CM | POA: Diagnosis not present

## 2014-10-24 DIAGNOSIS — E1142 Type 2 diabetes mellitus with diabetic polyneuropathy: Secondary | ICD-10-CM | POA: Insufficient documentation

## 2014-10-24 NOTE — Progress Notes (Signed)
[facility]  PATIENT: Carrie Atkins DOB: 05-May-1934  REFERRING CLINICIAN: Arelia Sneddon HISTORY FROM: patient  REASON FOR VISIT: new consult    HISTORICAL  CHIEF COMPLAINT:  Chief Complaint  Patient presents with  . New Evaluation    falling    HISTORY OF PRESENT ILLNESS:   81 year old left-handed female here for evaluation of gait and balance difficulty. She is followed on 4 times in 2015 at 4 times in 2016. She tends to lose her balance, especially when climbing or descending steps. Her last fall 3 weeks ago. Patient has hypertension, diabetes, hypercholesterolemia, heart disease, depression, CLL cancer.  Patient feels numbness and weakness in her legs. She's had numbness for past 10 years presumably related to diabetes. Last hemoglobin A1c 8.9. She feels stiffness in her legs. No problems with her fingers or arms. She has some neck pain. She has some back pain.    REVIEW OF SYSTEMS: Full 14 system review of systems performed and notable only for weight gain fatigue murmur swelling in legs birthmarks incontinence diarrhea wheezing shortness of breath anemia joint pain cramps achy muscles depression decreased energy change in appetite restless leg sleepiness numbness weakness.   ALLERGIES: Allergies  Allergen Reactions  . Hydrocodone   . Sulfonamide Derivatives     HOME MEDICATIONS: Outpatient Prescriptions Prior to Visit  Medication Sig Dispense Refill  . acetaminophen (TYLENOL ARTHRITIS PAIN) 650 MG CR tablet Take 1,300 mg by mouth 2 (two) times daily.      Marland Kitchen amoxicillin (AMOXIL) 500 MG tablet Take 500 mg by mouth as needed (Dental work).     Marland Kitchen aspirin 81 MG tablet Take 81 mg by mouth daily.      Marland Kitchen atorvastatin (LIPITOR) 40 MG tablet Take 40 mg by mouth every evening.    Marland Kitchen CALCIUM-MAGNESIUM-ZINC PO Take by mouth 2 (two) times daily.      . carvedilol (COREG) 25 MG tablet TAKE ONE TABLET BY MOUTH TWICE DAILY 180 tablet 1  . ergocalciferol (VITAMIN D2)  50000 UNITS capsule Take 50,000 Units by mouth once a week.      . ferrous sulfate 325 (65 FE) MG tablet Take 325 mg by mouth daily with breakfast.      . fish oil-omega-3 fatty acids 1000 MG capsule Take 1,200 mg by mouth 2 (two) times daily.      . furosemide (LASIX) 80 MG tablet Take 40 mg by mouth daily.    Marland Kitchen glimepiride (AMARYL) 1 MG tablet Take 4 mg by mouth 2 (two) times daily.     . naproxen (NAPROSYN) 500 MG tablet Take 500 mg by mouth 2 (two) times daily with a meal.     . naproxen sodium (ANAPROX) 220 MG tablet Take 220 mg by mouth 2 (two) times daily as needed.     Marland Kitchen omeprazole (PRILOSEC) 20 MG capsule Take 20 mg by mouth daily.      Marland Kitchen PARoxetine (PAXIL) 20 MG tablet Take 20 mg by mouth daily.     . pioglitazone (ACTOS) 30 MG tablet Take 30 mg by mouth 2 (two) times daily.    . ranitidine (ZANTAC) 150 MG tablet Take 150 mg by mouth 2 (two) times daily as needed for heartburn.    . sitaGLIPtin (JANUVIA) 100 MG tablet Take 100 mg by mouth daily.    . valsartan (DIOVAN) 320 MG tablet TAKE 1 TABLET BY MOUTH DAILY 90 tablet 1   No facility-administered medications prior to visit.    PAST MEDICAL HISTORY: Past  Medical History  Diagnosis Date  . Diabetes mellitus   . Hypertension   . Hyperlipidemia   . Chest pain   . Chronic lymphocytic leukemia   . SOB (shortness of breath)   . CLL (chronic lymphocytic leukemia) 06/05/2013    PAST SURGICAL HISTORY: Past Surgical History  Procedure Laterality Date  . Total knee arthroplasty    . Tonsillectomy    . Cholecystectomy    . US echocardiography  01/10/2007    EF 55-60%  . US echocardiography  02/23/2006     EF 55-60%  . Cardiovascular stress test  05/29/2010    EF 83%    FAMILY HISTORY: Family History  Problem Relation Age of Onset  . Stroke Mother   . Lung cancer Father     SOCIAL HISTORY:  History   Social History  . Marital Status: Widowed    Spouse Name: N/A  . Number of Children: 1  . Years of Education: 12     Occupational History  . Not on file.   Social History Main Topics  . Smoking status: Never Smoker   . Smokeless tobacco: Never Used  . Alcohol Use: No  . Drug Use: No  . Sexual Activity: Not on file   Other Topics Concern  . Not on file   Social History Narrative   Lives in "mother in law suite" at her sons house   Drinks [place]:   10/24/14 0954  BP: 128/72  Pulse: 60  Height: 5\' 1"  (1.549 m)  Weight: 223 lb (101.152 kg)    Body mass index is 42.16 kg/(m^2).   Visual Acuity Screening   Right eye Left eye Both eyes  Without correction:     With correction: 20/50 20/50     No flowsheet data found.  GENERAL EXAM: Patient is in no distress; well developed, nourished and groomed; neck is supple  CARDIOVASCULAR: Regular rate and rhythm, no murmurs, no carotid bruits  NEUROLOGIC: MENTAL STATUS: awake, alert, oriented to person, place and time, recent and remote memory intact, normal attention and concentration, language fluent, comprehension intact, naming intact, fund of knowledge appropriate; MILD MOTOR APRAXIA CRANIAL NERVE: no papilledema on fundoscopic exam, pupils equal and reactive to light, visual fields full to confrontation, extraocular muscles intact, no nystagmus, facial sensation and strength symmetric, hearing intact, palate elevates symmetrically, uvula midline, shoulder shrug symmetric, tongue midline. MOTOR: normal bulk and tone, full strength in the BUE; INCREASED TONE IN BLE (LLE >RLE); BLE (HF 3, KE/KF 3, DF 4, PF 5) SENSORY: DECR PP, TEMP IN FEET; ABSENT VIB AT ANKLES AND TOES COORDINATION: finger-nose-finger, fine finger movements SLOW; FOOT TAP SLOW IN LEFT WORSE THAN RIGHT REFLEXES: BUE 3, KNEES 3, RIGHT ANKLE 3, LEFT ANKLE 4; RIGHT TOE DOWN; LEFT TOE UP GAIT/STATION: UNSTEADY, SPASTIC GAIT; SHORT STEPS    DIAGNOSTIC DATA (LABS, IMAGING, TESTING) - I reviewed patient records, labs, notes, testing and imaging  myself where available.  Lab Results  Component Value Date   WBC 10.5* 06/06/2013   HGB 12.4 06/06/2013   HCT 36.7 06/06/2013   MCV 95.8 06/06/2013   PLT 168 06/06/2013      Component Value Date/Time   NA 141 06/06/2013 0937   NA 138 05/27/2011 1424   K 4.6 06/06/2013 0937   K 5.2 05/27/2011 1424   CL 109* 06/02/2012 0816   CL 106 05/27/2011 1424   CO2 24 06/06/2013 0937   CO2 21 05/27/2011  1424   GLUCOSE 174* 06/06/2013 0937   GLUCOSE 188* 06/02/2012 0816   GLUCOSE 234* 05/27/2011 1424   BUN 23.8 06/06/2013 0937   BUN 20 05/27/2011 1424   CREATININE 1.1 06/06/2013 0937   CREATININE 1.01 05/27/2011 1424   CALCIUM 9.1 06/06/2013 0937   CALCIUM 9.2 05/27/2011 1424   PROT 6.6 06/06/2013 0937   PROT 6.5 05/27/2011 1424   ALBUMIN 3.3* 06/06/2013 0937   ALBUMIN 4.2 05/27/2011 1424   AST 22 06/06/2013 0937   AST 22 05/27/2011 1424   ALT 27 06/06/2013 0937   ALT 27 05/27/2011 1424   ALKPHOS 57 06/06/2013 0937   ALKPHOS 61 05/27/2011 1424   BILITOT 0.44 06/06/2013 0937   BILITOT 0.2* 05/27/2011 1424   Lab Results  Component Value Date   CHOL 153 02/03/2011   HDL 34.90* 02/03/2011   LDLCALC 86 02/03/2011   TRIG 163.0* 02/03/2011   CHOLHDL 4 02/03/2011   No results found for: HGBA1C No results found for: VITAMINB12 No results found for: TSH     ASSESSMENT AND PLAN  81 y.o. year old female here with progressive gait and balance difficulty, with at least 10 years of diabetic neuropathy, now with spastic gait, lower extremity weakness, hyperreflexia.   Ddx: cervical myelopathy, lumbar radiculopathy/spinal stenosis, diabetic neuropathy  PLAN: - MRI cervical and lumbar spine - home PT - use rollator walker - caution with driving  Orders Placed This Encounter  Procedures  . For home use only DME 4 wheeled rolling walker with seat  . MR Cervical Spine Wo Contrast  . MR Lumbar Spine Wo Contrast  . Ambulatory referral to [place]   Return in about 3 months  (around 01/24/2015).    Penni Bombard, MD 02/10/9370, 69:67 AM Certified in Neurology, Neurophysiology and Neuroimaging  [facility] [address] [place], [place] [address] [phone]

## 2014-10-24 NOTE — Patient Instructions (Signed)
I will check MRI cervical and lumbar spine.  Use rollator walker.  I will setup home health physical therapy.

## 2014-10-29 ENCOUNTER — Telehealth: Payer: Self-pay

## 2014-10-29 NOTE — Telephone Encounter (Signed)
Mrs. Fullenwider came in and said she tried to get a walker that Dr. Mamie Nick said to get but at [facility] they need some more info in order to get her a walker.Marland KitchenMarland KitchenThey need DX Code, NPI# and Dr.s Notes.Marland KitchenMarland KitchenPlease fax to [phone] She also filled out a release of record form that I have given to Medical Records Please call [phone]) when faxed

## 2014-10-30 NOTE — Telephone Encounter (Signed)
Called and spoke with the pt, informed her that I faxed her original order with the correct information and NPI number for Dr. Leta Baptist on it over to [facility]. She stated a thanks and an understanding

## 2014-11-01 ENCOUNTER — Telehealth: Payer: Self-pay | Admitting: Diagnostic Neuroimaging

## 2014-11-01 NOTE — Telephone Encounter (Signed)
Hildred Alamin with [facility] is calling regarding the patient. Hildred Alamin needs verbal orders for the patient to have physical therapy for one time a week for one week and twice a week for eight weeks. Please call.

## 2014-11-01 NOTE — Telephone Encounter (Signed)
Spoke with Hildred Alamin on the phone and gave her verbal orders for PT once for 1 week and twice a week x8 weeks per Dr. Leta Baptist. She thanked me and I told her to call me back with any further issues. She thanked me

## 2014-11-07 ENCOUNTER — Ambulatory Visit
Admission: RE | Admit: 2014-11-07 | Discharge: 2014-11-07 | Disposition: A | Discharge status: Home / Self Care | Payer: Medicare Other | Source: Ambulatory Visit | Attending: Diagnostic Neuroimaging | Admitting: Diagnostic Neuroimaging

## 2014-11-07 DIAGNOSIS — R292 Abnormal reflex: Secondary | ICD-10-CM

## 2014-11-07 DIAGNOSIS — E1142 Type 2 diabetes mellitus with diabetic polyneuropathy: Secondary | ICD-10-CM

## 2014-11-07 DIAGNOSIS — R29898 Other symptoms and signs involving the musculoskeletal system: Secondary | ICD-10-CM

## 2014-11-07 DIAGNOSIS — R269 Unspecified abnormalities of gait and mobility: Secondary | ICD-10-CM | POA: Diagnosis not present

## 2014-12-13 ENCOUNTER — Telehealth: Payer: Self-pay | Admitting: Diagnostic Neuroimaging

## 2014-12-13 NOTE — Telephone Encounter (Signed)
I called patient with MRI results. Degenerative changes in cervical and lumbar spine. Severe spinal stenosis in lumbar region. Mild spinal stenosis in cervical. Her gait issues are likely related to lumbar spinal stenosis and diabetic neuropathy. She feels a little better with PT. I offered neurosurg/spine surg consult, but she and I are reluctant to pursue this given her age, functional status and medical issues. Will continue conservative mgmt and follow up in clinic in August 2016.   Penni Bombard, MD 2/84/1324, 40:10 AM Certified in Neurology, Neurophysiology and Neuroimaging  [facility] [address] [place],  [address] [phone]

## 2014-12-26 ENCOUNTER — Telehealth: Payer: Self-pay | Admitting: Diagnostic Neuroimaging

## 2014-12-26 NOTE — Telephone Encounter (Signed)
Carrie Atkins with [facility] requesting additional orders for 2x week for 4 weeks for PT. She can be reached at [phone].

## 2014-12-27 NOTE — Telephone Encounter (Signed)
Per Dr Leta Baptist, verbal order given to continue with PT 2 x weekly x 4 more weeks. Spoke with Imelda Pillow and gave her verbal order. She expressed understanding, appreciation.

## 2015-01-22 ENCOUNTER — Other Ambulatory Visit: Payer: Self-pay | Admitting: Cardiovascular Disease

## 2015-01-30 ENCOUNTER — Encounter: Payer: Self-pay | Admitting: Diagnostic Neuroimaging

## 2015-01-30 ENCOUNTER — Ambulatory Visit (INDEPENDENT_AMBULATORY_CARE_PROVIDER_SITE_OTHER): Payer: Medicare Other | Admitting: Diagnostic Neuroimaging

## 2015-01-30 VITALS — BP 129/64 | HR 52 | Ht 61.0 in | Wt 225.0 lb

## 2015-01-30 DIAGNOSIS — G959 Disease of spinal cord, unspecified: Secondary | ICD-10-CM

## 2015-01-30 DIAGNOSIS — E1142 Type 2 diabetes mellitus with diabetic polyneuropathy: Secondary | ICD-10-CM

## 2015-01-30 DIAGNOSIS — R292 Abnormal reflex: Secondary | ICD-10-CM | POA: Diagnosis not present

## 2015-01-30 DIAGNOSIS — M4806 Spinal stenosis, lumbar region: Secondary | ICD-10-CM

## 2015-01-30 DIAGNOSIS — M48062 Spinal stenosis, lumbar region with neurogenic claudication: Secondary | ICD-10-CM | POA: Insufficient documentation

## 2015-01-30 DIAGNOSIS — R269 Unspecified abnormalities of gait and mobility: Secondary | ICD-10-CM

## 2015-01-30 NOTE — Progress Notes (Signed)
[facility]  PATIENT: Carrie Atkins DOB: [birth year]  REFERRING CLINICIAN: Arelia Sneddon HISTORY FROM: patient  REASON FOR VISIT: new consult    HISTORICAL  CHIEF COMPLAINT:  Chief Complaint  Patient presents with  . Gait difficulty    rm 7 , daugfhter -Berta  . Follow-up    3 mos    HISTORY OF PRESENT ILLNESS:   UPDATE 01/30/15: Since last visit, sxs stable. No falls. Some stumbles. Testing results reviewed again.  PRIOR HPI (10/24/14): 81 year old left-handed female here for evaluation of gait and balance difficulty. She is followed on 4 times in 2015 at 4 times in 2016. She tends to lose her balance, especially when climbing or descending steps. Her last fall 3 weeks ago. Patient has hypertension, diabetes, hypercholesterolemia, heart disease, depression, CLL cancer. Patient feels numbness and weakness in her legs. She's had numbness for past 10 years presumably related to diabetes. Last hemoglobin A1c 8.9. She feels stiffness in her legs. No problems with her fingers or arms. She has some neck pain. She has some back pain.   REVIEW OF SYSTEMS: Full 14 system review of systems performed and notable only for weight gain fatigue murmur swelling in legs birthmarks incontinence diarrhea wheezing shortness of breath anemia joint pain cramps achy muscles depression decreased energy change in appetite restless leg sleepiness numbness weakness.   ALLERGIES: Allergies  Allergen Reactions  . Hydrocodone   . Sulfonamide Derivatives     HOME MEDICATIONS: Outpatient Prescriptions Prior to Visit  Medication Sig Dispense Refill  . acetaminophen (TYLENOL ARTHRITIS PAIN) 650 MG CR tablet Take 1,300 mg by mouth 2 (two) times daily.      Marland Kitchen amoxicillin (AMOXIL) 500 MG tablet Take 500 mg by mouth as needed (Dental work).     Marland Kitchen aspirin 81 MG tablet Take 81 mg by mouth daily.      Marland Kitchen atorvastatin (LIPITOR) 40 MG tablet Take 40 mg by mouth every evening.    Marland Kitchen CALCIUM-MAGNESIUM-ZINC  PO Take by mouth 2 (two) times daily.      . carvedilol (COREG) 25 MG tablet TAKE 1 TABLET BY MOUTH TWICE DAILY 180 tablet 3  . ferrous sulfate 325 (65 FE) MG tablet Take 325 mg by mouth daily with breakfast.      . furosemide (LASIX) 80 MG tablet Take 40 mg by mouth daily.    Marland Kitchen glimepiride (AMARYL) 1 MG tablet Take 4 mg by mouth 2 (two) times daily.     Marland Kitchen omeprazole (PRILOSEC) 20 MG capsule Take 20 mg by mouth daily.      Marland Kitchen PARoxetine (PAXIL) 20 MG tablet Take 20 mg by mouth daily.     . ranitidine (ZANTAC) 150 MG tablet Take 150 mg by mouth 2 (two) times daily as needed for heartburn.    . ergocalciferol (VITAMIN D2) 50000 UNITS capsule Take 50,000 Units by mouth once a week.      . fish oil-omega-3 fatty acids 1000 MG capsule Take 1,200 mg by mouth 2 (two) times daily.      . naproxen (NAPROSYN) 500 MG tablet Take 500 mg by mouth 2 (two) times daily with a meal.     . naproxen sodium (ANAPROX) 220 MG tablet Take 220 mg by mouth 2 (two) times daily as needed.     . pioglitazone (ACTOS) 30 MG tablet Take 30 mg by mouth 2 (two) times daily.    . sitaGLIPtin (JANUVIA) 100 MG tablet Take 100 mg by mouth daily.    Marland Kitchen  valsartan (DIOVAN) 320 MG tablet TAKE 1 TABLET BY MOUTH DAILY 90 tablet 1   No facility-administered medications prior to visit.    PAST MEDICAL HISTORY: Past Medical History  Diagnosis Date  . Diabetes mellitus   . Hypertension   . Hyperlipidemia   . Chest pain   . Chronic lymphocytic leukemia   . SOB (shortness of breath)   . CLL (chronic lymphocytic leukemia) 06/05/2013    PAST SURGICAL HISTORY: Past Surgical History  Procedure Laterality Date  . Total knee arthroplasty    . Tonsillectomy    . Cholecystectomy    . US echocardiography  01/10/2007    EF 55-60%  . US echocardiography  02/23/2006     EF 55-60%  . Cardiovascular stress test  05/29/2010    EF 83%    FAMILY HISTORY: Family History  Problem Relation Age of Onset  . Stroke Mother   . Lung cancer  Father   . COPD Brother     SOCIAL HISTORY:  Social History   Social History  . Marital Status: Widowed    Spouse Name: N/A  . Number of Children: 1  . Years of Education: 12   Occupational History  . Not on file.   Social History Main Topics  . Smoking status: Never Smoker   . Smokeless tobacco: Never Used  . Alcohol Use: No  . Drug Use: No  . Sexual Activity: Not on file   Other Topics Concern  . Not on file   Social History Narrative   Lives in "mother in law suite" at her sons house   Drinks [place]:   01/30/15 0945  BP: 129/64  Pulse: 52  Height: 5\' 1"  (1.549 m)  Weight: 225 lb (102.059 kg)    Body mass index is 42.54 kg/(m^2).  No exam data present  No flowsheet data found.  GENERAL EXAM: Patient is in no distress; well developed, nourished and groomed; neck is supple  CARDIOVASCULAR: Regular rate and rhythm, no murmurs, no carotid bruits  NEUROLOGIC: MENTAL STATUS: awake, alert, language fluent, comprehension intact, naming intact, fund of knowledge appropriate; MILD MOTOR APRAXIA CRANIAL NERVE: no papilledema on fundoscopic exam, pupils equal and reactive to light, visual fields full to confrontation, extraocular muscles intact, no nystagmus, facial sensation and strength symmetric, hearing intact, palate elevates symmetrically, uvula midline, shoulder shrug symmetric, tongue midline. MOTOR: normal bulk and tone, full strength in the BUE; INCREASED TONE IN BLE (LLE >RLE); BLE (HF 3, KE/KF 3, DF 4, PF 5) SENSORY: DECR PP, TEMP IN FEET; ABSENT VIB AT ANKLES AND TOES COORDINATION: finger-nose-finger, fine finger movements SLOW; FOOT TAP SLOW IN LEFT WORSE THAN RIGHT REFLEXES: BUE 3, KNEES 3, ANKLES 3 GAIT/STATION: UNSTEADY, SPASTIC GAIT; SHORT STEPS    DIAGNOSTIC DATA (LABS, IMAGING, TESTING) - I reviewed patient records, labs, notes, testing and imaging myself where available.  Lab Results  Component Value Date     WBC 10.5* 06/06/2013   HGB 12.4 06/06/2013   HCT 36.7 06/06/2013   MCV 95.8 06/06/2013   PLT 168 06/06/2013      Component Value Date/Time   NA 141 06/06/2013 0937   NA 138 05/27/2011 1424   K 4.6 06/06/2013 0937   K 5.2 05/27/2011 1424   CL 109* 06/02/2012 0816   CL 106 05/27/2011 1424   CO2 24 06/06/2013 0937   CO2 21 05/27/2011 1424   GLUCOSE 174* 06/06/2013 0937   GLUCOSE 188* 06/02/2012  0816   GLUCOSE 234* 05/27/2011 1424   BUN 23.8 06/06/2013 0937   BUN 20 05/27/2011 1424   CREATININE 1.1 06/06/2013 0937   CREATININE 1.01 05/27/2011 1424   CALCIUM 9.1 06/06/2013 0937   CALCIUM 9.2 05/27/2011 1424   PROT 6.6 06/06/2013 0937   PROT 6.5 05/27/2011 1424   ALBUMIN 3.3* 06/06/2013 0937   ALBUMIN 4.2 05/27/2011 1424   AST 22 06/06/2013 0937   AST 22 05/27/2011 1424   ALT 27 06/06/2013 0937   ALT 27 05/27/2011 1424   ALKPHOS 57 06/06/2013 0937   ALKPHOS 61 05/27/2011 1424   BILITOT 0.44 06/06/2013 0937   BILITOT 0.2* 05/27/2011 1424   Lab Results  Component Value Date   CHOL 153 02/03/2011   HDL 34.90* 02/03/2011   LDLCALC 86 02/03/2011   TRIG 163.0* 02/03/2011   CHOLHDL 4 02/03/2011   No results found for: HGBA1C No results found for: VITAMINB12 No results found for: TSH   11/07/14 MRI cervical spine (without) [I reviewed images myself and agree with interpretation. -VRP]  1. At C3-4: disc bulging and uncovertebral joint hypertrophy with mild spinal stenosis and moderate-severe biforaminal stenosis; no cord signal abnormalities. 2. At C4-5: disc bulging and uncovertebral joint hypertrophy with mild spinal stenosis and severe biforaminal stenosis; no cord signal abnormalities. 3. At C5-6: disc bulging with moderate left foraminal stenosis. 4. At C6-7: disc bulging and facet hypertrophy with moderate left foraminal stenosis. 5. At C7-T1: disc bulging with facet hypertrophy with mild biforaminal stenosis.  11/07/14 MRI lumbar spine (without) [I reviewed images  myself and agree with interpretation. -VRP]  1. At L3-4: disc bulging and facet and ligamentum flavum hypertrophy with severe spinal stenosis, mild right and severe left foraminal stenosis  2. At L4-5: disc bulging and facet hypertrophy with severe spinal stenosis, severe right and mild left foraminal stenosis  3. At L5-S1: disc bulging and facet hypertrophy with moderate-severe spinal stenosis and mild-moderate biforaminal stenosis  4. At L2-3: disc bulging and facet hypertrophy with mild spinal stenosis and mild biforaminal stenosis         ASSESSMENT AND PLAN  81 y.o. year old female here with progressive gait and balance difficulty, with at least 10 years of diabetic neuropathy, now with spastic gait, lower extremity weakness, hyperreflexia.   Dx: mild cervical spinal stenosis + severe lumbar spinal stenosis + diabetic neuropathy   PLAN: - had long discussion about treatment options again; given age, functional status, medical conditions, we do not plan to pursue surgical options; will opt for conservative mgmt - continue home PT exercises - continue rollator walker - transition to stop driving  Return if symptoms worsen or fail to improve, for return to PCP.    Penni Bombard, MD 1/82/9937, 16:96 AM Certified in Neurology, Neurophysiology and Neuroimaging  [facility] [address] [place], [place] [address] [phone]

## 2015-01-30 NOTE — Patient Instructions (Signed)
-   had long discussion about treatment options again; given age, functional status, medical conditions, we do not plan to pursue surgical options; will opt for conservative mgmt - continue home PT exercises - continue rollator walker - transition to stop driving

## 2015-07-04 ENCOUNTER — Other Ambulatory Visit (HOSPITAL_COMMUNITY): Payer: Self-pay | Admitting: Family Medicine

## 2015-07-04 DIAGNOSIS — R0602 Shortness of breath: Secondary | ICD-10-CM

## 2015-07-11 ENCOUNTER — Ambulatory Visit (HOSPITAL_COMMUNITY): Payer: Medicare Other | Attending: Cardiovascular Disease

## 2015-07-11 ENCOUNTER — Other Ambulatory Visit: Payer: Self-pay

## 2015-07-11 DIAGNOSIS — R0602 Shortness of breath: Secondary | ICD-10-CM | POA: Diagnosis not present

## 2015-10-08 ENCOUNTER — Encounter: Payer: Self-pay | Admitting: Cardiovascular Disease

## 2015-10-08 ENCOUNTER — Ambulatory Visit (INDEPENDENT_AMBULATORY_CARE_PROVIDER_SITE_OTHER): Payer: Medicare Other | Admitting: Cardiovascular Disease

## 2015-10-08 VITALS — BP 106/64 | HR 67 | Ht 61.0 in | Wt 194.8 lb

## 2015-10-08 DIAGNOSIS — E785 Hyperlipidemia, unspecified: Secondary | ICD-10-CM | POA: Diagnosis not present

## 2015-10-08 DIAGNOSIS — I10 Essential (primary) hypertension: Secondary | ICD-10-CM | POA: Diagnosis not present

## 2015-10-08 DIAGNOSIS — I5032 Chronic diastolic (congestive) heart failure: Secondary | ICD-10-CM

## 2015-10-08 LAB — BASIC METABOLIC PANEL
BUN: 30 mg/dL — ABNORMAL HIGH (ref 7–25)
CALCIUM: 9.1 mg/dL (ref 8.6–10.4)
CO2: 19 mmol/L — ABNORMAL LOW (ref 20–31)
CREATININE: 1.54 mg/dL — AB (ref 0.60–0.88)
Chloride: 99 mmol/L (ref 98–110)
Glucose, Bld: 382 mg/dL — ABNORMAL HIGH (ref 65–99)
Potassium: 4.2 mmol/L (ref 3.5–5.3)
Sodium: 133 mmol/L — ABNORMAL LOW (ref 135–146)

## 2015-10-08 NOTE — Progress Notes (Signed)
Cardiology Office Note   Date:  10/08/2015   ID:  Carrie Atkins, DOB Aug 25, 1933, MRN [id number]  PCP:  [name]., MD  Cardiologist:   Thayer Headings, MD   Chief Complaint  Patient presents with  . Hypertension   82 y.o.  female with a history of left ventricular outflow tract obstruction. She also has a history of hypertension and hyperlipidemia. She has mild dyspnea on exertion but otherwise no significant shortness of breath or chest pain.  Jan. 30, 2015:  Carrie Atkins is seen after a 2 1/2 year absence. Her BP readings have been better that today. She has had some worsening dyspnea. Has DOE. Has to sit down to rest after doing any type of exertion. She ran out of energy while shopping at [employer] several weeks ago.  She denies any angina-like chest pain. She has occasional nonspecific atypical pains. These pains are not associated with exertion.   Oct 18, 2014:  Carrie Atkins is a 82 y.o. female who presents for  Her HTN and HCM. She has been falling recently .  Has poor balance when walking.  Is very weak.  No CP , no dyspnea  No walking much due to balance issues.  Has had imbalance for the past year , has fallen 3 times this year.   April 25 , 2017:  Seen back after a year absence.   Saw Dr. Darron Doom at [facility] . Feeling well  Has good days and bad days  Had an echo in Jan. 2017  Normal LV systolic function, grade 1 diastolic CHF.  No CP , Does have DOE - doing household chores   Loses her balance frequently  - walks with a walker most of the time now     Past Medical History  Diagnosis Date  . Diabetes mellitus   . Hypertension   . Hyperlipidemia   . Chest pain   . Chronic lymphocytic leukemia ([place])   . SOB (shortness of breath)   . CLL (chronic lymphocytic leukemia) ([place]) 06/05/2013    Past Surgical History  Procedure Laterality Date  . Total knee arthroplasty    . Tonsillectomy    . Cholecystectomy    . US echocardiography   01/10/2007    EF 55-60%  . US echocardiography  02/23/2006     EF 55-60%  . Cardiovascular stress test  05/29/2010    EF 83%     Current Outpatient Prescriptions  Medication Sig Dispense Refill  . acetaminophen (TYLENOL ARTHRITIS PAIN) 650 MG CR tablet Take 1,300 mg by mouth 2 (two) times daily.      Marland Kitchen amoxicillin (AMOXIL) 500 MG tablet Take 500 mg by mouth as needed (Dental work).     Marland Kitchen aspirin 81 MG tablet Take 81 mg by mouth daily.      Marland Kitchen atorvastatin (LIPITOR) 40 MG tablet Take 40 mg by mouth every evening.    Marland Kitchen CALCIUM-MAGNESIUM-ZINC PO Take by mouth 2 (two) times daily.      . carvedilol (COREG) 25 MG tablet TAKE 1 TABLET BY MOUTH TWICE DAILY 180 tablet 3  . ferrous sulfate 325 (65 FE) MG tablet Take 325 mg by mouth daily with breakfast.      . furosemide (LASIX) 80 MG tablet Take 40 mg by mouth daily.    Marland Kitchen glimepiride (AMARYL) 1 MG tablet Take 4 mg by mouth 2 (two) times daily.     . metFORMIN (GLUCOPHAGE) 1000 MG tablet Take 1,000 mg by mouth 2 (  two) times daily.    Marland Kitchen omeprazole (PRILOSEC) 20 MG capsule Take 20 mg by mouth daily.      Marland Kitchen oxycodone (OXY-IR) 5 MG capsule Take 5 mg by mouth every 4 (four) hours as needed.    Marland Kitchen PARoxetine (PAXIL) 20 MG tablet Take 20 mg by mouth daily.     . ranitidine (ZANTAC) 150 MG tablet Take 150 mg by mouth 2 (two) times daily as needed for heartburn.     No current facility-administered medications for this visit.    Allergies:   Hydrocodone and Sulfonamide derivatives    Social History:  The patient  reports that she has never smoked. She has never used smokeless tobacco. She reports that she does not drink alcohol or use illicit drugs.   Family History:  The patient's family history includes COPD in her brother; Lung cancer in her father; Stroke in her mother.    ROS:  Please see the history of present illness.    Review of Systems: Constitutional:  denies fever, chills, diaphoresis, appetite change and fatigue.  HEENT: denies  photophobia, eye pain, redness, hearing loss, ear pain, congestion, sore throat, rhinorrhea, sneezing, neck pain, neck stiffness and tinnitus.  Respiratory: denies SOB, DOE, cough, chest tightness, and wheezing.  Cardiovascular: denies chest pain, palpitations and leg swelling.  Gastrointestinal: denies nausea, vomiting, abdominal pain, diarrhea, constipation, blood in stool.  Genitourinary: denies dysuria, urgency, frequency, hematuria, flank pain and difficulty urinating.  Musculoskeletal: denies  myalgias, back pain, joint swelling, arthralgias and gait problem.   Skin: denies pallor, rash and wound.  Neurological: denies dizziness, seizures, syncope, weakness, light-headedness, numbness and headaches.   Hematological: denies adenopathy, easy bruising, personal or family bleeding history.  Psychiatric/ Behavioral: denies suicidal ideation, mood changes, confusion, nervousness, sleep disturbance and agitation.       All other systems are reviewed and negative.    PHYSICAL EXAM: VS:  BP 106/64 mmHg  Pulse 67  Ht 5\' 1"  (1.549 m)  Wt 194 lb 12.8 oz (88.361 kg)  BMI 36.83 kg/m2 , BMI Body mass index is 36.83 kg/(m^2). GEN: Well nourished, well developed, in no acute distress HEENT: normal Neck: no JVD, carotid bruits, or masses Cardiac: RRR; no murmurs, rubs, or gallops,no edema  Respiratory:  clear to auscultation bilaterally, normal work of breathing GI: soft, nontender, nondistended, + BS MS: no deformity or atrophy Skin: warm and dry, no rash Neuro:  Strength and sensation are intact Psych: normal   EKG:  EKG is ordered today. The ekg ordered today demonstrates NSR at 67. No ST or T wave changes   Recent Labs: No results found for requested labs within last 365 days.    Lipid Panel    Component Value Date/Time   CHOL 153 02/03/2011 0847   TRIG 163.0* 02/03/2011 0847   HDL 34.90* 02/03/2011 0847   CHOLHDL 4 02/03/2011 0847   VLDL 32.6 02/03/2011 0847   LDLCALC 86  02/03/2011 0847      Wt Readings from Last 3 Encounters:  10/08/15 194 lb 12.8 oz (88.361 kg)  01/30/15 225 lb (102.059 kg)  10/24/14 223 lb (101.152 kg)      Other studies Reviewed: Additional studies/ records that were reviewed today include: . Review of the above records demonstrates:   ASSESSMENT AND PLAN:  1.  Hypertension: Her blood pressures fairly well-controlled.  She has normal LV systolic function ,  Has grade 1 diastolic dysfunction  Lungs are clear today . Encouraged her to avoid fast foods  and excessive salt .   2. Hyperlipidemia: Stable, follow-up with Dr. Arelia Sneddon / Dr. Darron Doom .  3. Left ventricular hypertrophy: She has a history of mild LVH. There is no clinical  evidence of dynamic obstruction.    Current medicines are reviewed at length with the patient today.  The patient does not have concerns regarding medicines.  The following changes have been made:  no change  Labs/ tests ordered today include:   Orders Placed This Encounter  Procedures  . Basic Metabolic Panel (BMET)  . EKG 12-Lead     Disposition:   FU with me in 1 year       Worth Kober, Wonda Cheng, MD  10/08/2015 9:38 AM    [facility] [place], [place], [place]  [address] Phone: [phone]; Fax: [phone]   [facility]  [address] [place], [place]  [address] [phone]    Fax [phone]

## 2015-10-08 NOTE — Patient Instructions (Signed)
Medication Instructions:  Your physician recommends that you continue on your current medications as directed. Please refer to the Current Medication list given to you today.   Labwork: TODAY - basic metabolic panel   Testing/Procedures: None Ordered   Follow-Up: Your physician wants you to follow-up in: 1 year with Dr. Nahser. You will receive a reminder letter in the mail two months in advance. If you don't receive a letter, please call our office to schedule the follow-up appointment.   If you need a refill on your cardiac medications before your next appointment, please call your pharmacy.   Thank you for choosing [facility]! Analisia Kingsford, RN [phone]    

## 2015-10-09 ENCOUNTER — Telehealth: Payer: Self-pay | Admitting: *Deleted

## 2015-10-09 NOTE — Telephone Encounter (Signed)
Pt has been notified of lab results and findings. Pt advised to f/u w/PCP as well as I will fax results to Dr. Arelia Sneddon. Pt verbalized understanding to plan of care.

## 2015-12-31 ENCOUNTER — Inpatient Hospital Stay (HOSPITAL_COMMUNITY): Payer: Medicare Other

## 2015-12-31 ENCOUNTER — Inpatient Hospital Stay (HOSPITAL_COMMUNITY)
Admission: EM | Admit: 2015-12-31 | Discharge: 2016-01-04 | DRG: 871 | Disposition: A | Discharge status: Skilled Nursing Facility | Payer: Medicare Other | Attending: Internal Medicine | Admitting: Internal Medicine

## 2015-12-31 ENCOUNTER — Emergency Department (HOSPITAL_COMMUNITY): Payer: Medicare Other

## 2015-12-31 ENCOUNTER — Encounter (HOSPITAL_COMMUNITY): Payer: Self-pay | Admitting: Emergency Medicine

## 2015-12-31 DIAGNOSIS — A419 Sepsis, unspecified organism: Principal | ICD-10-CM | POA: Diagnosis present

## 2015-12-31 DIAGNOSIS — R739 Hyperglycemia, unspecified: Secondary | ICD-10-CM | POA: Diagnosis not present

## 2015-12-31 DIAGNOSIS — D72829 Elevated white blood cell count, unspecified: Secondary | ICD-10-CM | POA: Diagnosis not present

## 2015-12-31 DIAGNOSIS — A4151 Sepsis due to Escherichia coli [E. coli]: Secondary | ICD-10-CM | POA: Diagnosis not present

## 2015-12-31 DIAGNOSIS — N390 Urinary tract infection, site not specified: Secondary | ICD-10-CM | POA: Diagnosis not present

## 2015-12-31 DIAGNOSIS — B9620 Unspecified Escherichia coli [E. coli] as the cause of diseases classified elsewhere: Secondary | ICD-10-CM | POA: Diagnosis present

## 2015-12-31 DIAGNOSIS — G9341 Metabolic encephalopathy: Secondary | ICD-10-CM | POA: Diagnosis present

## 2015-12-31 DIAGNOSIS — E1110 Type 2 diabetes mellitus with ketoacidosis without coma: Secondary | ICD-10-CM | POA: Diagnosis present

## 2015-12-31 DIAGNOSIS — R1310 Dysphagia, unspecified: Secondary | ICD-10-CM | POA: Diagnosis present

## 2015-12-31 DIAGNOSIS — G9340 Encephalopathy, unspecified: Secondary | ICD-10-CM | POA: Diagnosis present

## 2015-12-31 DIAGNOSIS — N3000 Acute cystitis without hematuria: Secondary | ICD-10-CM | POA: Insufficient documentation

## 2015-12-31 DIAGNOSIS — E1140 Type 2 diabetes mellitus with diabetic neuropathy, unspecified: Secondary | ICD-10-CM | POA: Diagnosis present

## 2015-12-31 DIAGNOSIS — E1010 Type 1 diabetes mellitus with ketoacidosis without coma: Secondary | ICD-10-CM

## 2015-12-31 DIAGNOSIS — G039 Meningitis, unspecified: Secondary | ICD-10-CM

## 2015-12-31 DIAGNOSIS — E874 Mixed disorder of acid-base balance: Secondary | ICD-10-CM | POA: Diagnosis present

## 2015-12-31 DIAGNOSIS — R4182 Altered mental status, unspecified: Secondary | ICD-10-CM

## 2015-12-31 DIAGNOSIS — E1310 Other specified diabetes mellitus with ketoacidosis without coma: Secondary | ICD-10-CM | POA: Diagnosis present

## 2015-12-31 DIAGNOSIS — N3090 Cystitis, unspecified without hematuria: Secondary | ICD-10-CM | POA: Diagnosis not present

## 2015-12-31 DIAGNOSIS — I248 Other forms of acute ischemic heart disease: Secondary | ICD-10-CM | POA: Diagnosis present

## 2015-12-31 DIAGNOSIS — C9110 Chronic lymphocytic leukemia of B-cell type not having achieved remission: Secondary | ICD-10-CM

## 2015-12-31 DIAGNOSIS — N179 Acute kidney failure, unspecified: Secondary | ICD-10-CM | POA: Diagnosis not present

## 2015-12-31 DIAGNOSIS — Z79899 Other long term (current) drug therapy: Secondary | ICD-10-CM

## 2015-12-31 DIAGNOSIS — R569 Unspecified convulsions: Secondary | ICD-10-CM

## 2015-12-31 DIAGNOSIS — Z7982 Long term (current) use of aspirin: Secondary | ICD-10-CM

## 2015-12-31 DIAGNOSIS — R509 Fever, unspecified: Secondary | ICD-10-CM | POA: Diagnosis present

## 2015-12-31 DIAGNOSIS — E785 Hyperlipidemia, unspecified: Secondary | ICD-10-CM | POA: Diagnosis present

## 2015-12-31 DIAGNOSIS — Z7984 Long term (current) use of oral hypoglycemic drugs: Secondary | ICD-10-CM | POA: Diagnosis not present

## 2015-12-31 DIAGNOSIS — E876 Hypokalemia: Secondary | ICD-10-CM | POA: Diagnosis present

## 2015-12-31 HISTORY — DX: Leukemia, unspecified not having achieved remission: C95.90

## 2015-12-31 LAB — CBC WITH DIFFERENTIAL/PLATELET
Basophils Absolute: 0 10*3/uL (ref 0.0–0.1)
Basophils Relative: 0 %
EOS PCT: 0 %
Eosinophils Absolute: 0 10*3/uL (ref 0.0–0.7)
HEMATOCRIT: 39.2 % (ref 36.0–46.0)
Hemoglobin: 13.5 g/dL (ref 12.0–15.0)
LYMPHS PCT: 30 %
Lymphs Abs: 5.3 10*3/uL — ABNORMAL HIGH (ref 0.7–4.0)
MCH: 32.1 pg (ref 26.0–34.0)
MCHC: 34.4 g/dL (ref 30.0–36.0)
MCV: 93.3 fL (ref 78.0–100.0)
MONO ABS: 0.3 10*3/uL (ref 0.1–1.0)
MONOS PCT: 2 %
NEUTROS ABS: 11.8 10*3/uL — AB (ref 1.7–7.7)
Neutrophils Relative %: 68 %
PLATELETS: 216 10*3/uL (ref 150–400)
RBC: 4.2 MIL/uL (ref 3.87–5.11)
RDW: 12.3 % (ref 11.5–15.5)
WBC: 17.5 10*3/uL — ABNORMAL HIGH (ref 4.0–10.5)

## 2015-12-31 LAB — COMPREHENSIVE METABOLIC PANEL
ALBUMIN: 3.6 g/dL (ref 3.5–5.0)
ALK PHOS: 77 U/L (ref 38–126)
ALT: 27 U/L (ref 14–54)
AST: 29 U/L (ref 15–41)
Anion gap: 14 (ref 5–15)
BUN: 15 mg/dL (ref 6–20)
CALCIUM: 8.6 mg/dL — AB (ref 8.9–10.3)
CHLORIDE: 108 mmol/L (ref 101–111)
CO2: 15 mmol/L — AB (ref 22–32)
CREATININE: 1.36 mg/dL — AB (ref 0.44–1.00)
GFR calc Af Amer: 41 mL/min — ABNORMAL LOW (ref >60)
GFR calc non Af Amer: 36 mL/min — ABNORMAL LOW (ref >60)
GLUCOSE: 533 mg/dL — AB (ref 65–99)
Potassium: 4.3 mmol/L (ref 3.5–5.1)
SODIUM: 137 mmol/L (ref 135–145)
Total Bilirubin: 1 mg/dL (ref 0.3–1.2)
Total Protein: 6.7 g/dL (ref 6.5–8.1)

## 2015-12-31 LAB — BASIC METABOLIC PANEL
ANION GAP: 11 (ref 5–15)
BUN: 14 mg/dL (ref 6–20)
CO2: 18 mmol/L — AB (ref 22–32)
Calcium: 8.4 mg/dL — ABNORMAL LOW (ref 8.9–10.3)
Chloride: 113 mmol/L — ABNORMAL HIGH (ref 101–111)
Creatinine, Ser: 1.14 mg/dL — ABNORMAL HIGH (ref 0.44–1.00)
GFR calc Af Amer: 51 mL/min — ABNORMAL LOW (ref >60)
GFR calc non Af Amer: 44 mL/min — ABNORMAL LOW (ref >60)
GLUCOSE: 357 mg/dL — AB (ref 65–99)
POTASSIUM: 3.6 mmol/L (ref 3.5–5.1)
Sodium: 142 mmol/L (ref 135–145)

## 2015-12-31 LAB — PHOSPHORUS: PHOSPHORUS: 4.4 mg/dL (ref 2.5–4.6)

## 2015-12-31 LAB — I-STAT ARTERIAL BLOOD GAS, ED
ACID-BASE DEFICIT: 12 mmol/L — AB (ref 0.0–2.0)
BICARBONATE: 12.6 meq/L — AB (ref 20.0–24.0)
O2 SAT: 99 %
PCO2 ART: 27.8 mmHg — AB (ref 35.0–45.0)
PO2 ART: 156 mmHg — AB (ref 80.0–100.0)
Patient temperature: 101.3
TCO2: 13 mmol/L (ref 0–100)
pH, Arterial: 7.272 — ABNORMAL LOW (ref 7.350–7.450)

## 2015-12-31 LAB — URINALYSIS, ROUTINE W REFLEX MICROSCOPIC
BILIRUBIN URINE: NEGATIVE
KETONES UR: 40 mg/dL — AB
Nitrite: POSITIVE — AB
PH: 5.5 (ref 5.0–8.0)
PROTEIN: NEGATIVE mg/dL
Specific Gravity, Urine: 1.025 (ref 1.005–1.030)

## 2015-12-31 LAB — I-STAT VENOUS BLOOD GAS, ED
ACID-BASE DEFICIT: 8 mmol/L — AB (ref 0.0–2.0)
BICARBONATE: 19.5 meq/L — AB (ref 20.0–24.0)
O2 SAT: 67 %
PH VEN: 7.238 — AB (ref 7.250–7.300)
TCO2: 21 mmol/L (ref 0–100)
pCO2, Ven: 45.8 mmHg (ref 45.0–50.0)
pO2, Ven: 41 mmHg (ref 31.0–45.0)

## 2015-12-31 LAB — CBC
HEMATOCRIT: 36.5 % (ref 36.0–46.0)
HEMOGLOBIN: 12.1 g/dL (ref 12.0–15.0)
MCH: 31.3 pg (ref 26.0–34.0)
MCHC: 33.2 g/dL (ref 30.0–36.0)
MCV: 94.6 fL (ref 78.0–100.0)
Platelets: 185 10*3/uL (ref 150–400)
RBC: 3.86 MIL/uL — AB (ref 3.87–5.11)
RDW: 12.3 % (ref 11.5–15.5)
WBC: 17.1 10*3/uL — ABNORMAL HIGH (ref 4.0–10.5)

## 2015-12-31 LAB — URINE MICROSCOPIC-ADD ON

## 2015-12-31 LAB — CBG MONITORING, ED
Glucose-Capillary: 579 mg/dL (ref 65–99)
Glucose-Capillary: 502 mg/dL (ref 65–99)

## 2015-12-31 LAB — I-STAT CG4 LACTIC ACID, ED: Lactic Acid, Venous: 3.22 mmol/L (ref 0.5–1.9)

## 2015-12-31 LAB — PROTIME-INR
INR: 1.24 (ref 0.00–1.49)
Prothrombin Time: 15.8 seconds — ABNORMAL HIGH (ref 11.6–15.2)

## 2015-12-31 LAB — BETA-HYDROXYBUTYRIC ACID: Beta-Hydroxybutyric Acid: 4.92 mmol/L — ABNORMAL HIGH (ref 0.05–0.27)

## 2015-12-31 LAB — CREATININE, SERUM
Creatinine, Ser: 1.36 mg/dL — ABNORMAL HIGH (ref 0.44–1.00)
GFR, EST AFRICAN AMERICAN: 41 mL/min — AB (ref >60)
GFR, EST NON AFRICAN AMERICAN: 36 mL/min — AB (ref >60)

## 2015-12-31 LAB — MRSA PCR SCREENING: MRSA by PCR: NEGATIVE

## 2015-12-31 LAB — TROPONIN I
TROPONIN I: 0.03 ng/mL — AB (ref <0.03)
Troponin I: 0.03 ng/mL (ref <0.03)

## 2015-12-31 LAB — APTT: aPTT: 26 seconds (ref 24–37)

## 2015-12-31 LAB — GLUCOSE, CAPILLARY: Glucose-Capillary: 502 mg/dL (ref 65–99)

## 2015-12-31 LAB — MAGNESIUM: MAGNESIUM: 1.7 mg/dL (ref 1.7–2.4)

## 2015-12-31 LAB — CK: CK TOTAL: 470 U/L — AB (ref 38–234)

## 2015-12-31 LAB — LACTIC ACID, PLASMA: Lactic Acid, Venous: 2 mmol/L (ref 0.5–1.9)

## 2015-12-31 LAB — PROCALCITONIN

## 2015-12-31 MED ORDER — SODIUM CHLORIDE 0.9 % IV SOLN: INTRAVENOUS | Status: AC

## 2015-12-31 MED ORDER — LORAZEPAM 2 MG/ML IJ SOLN
2.0000 mg | Freq: Once | INTRAMUSCULAR | Status: AC
  Administered 2015-12-31: 2 mg via INTRAVENOUS

## 2015-12-31 MED ORDER — INSULIN ASPART 100 UNIT/ML ~~LOC~~ SOLN
12.0000 [IU] | Freq: Once | SUBCUTANEOUS | Status: AC
  Administered 2015-12-31: 12 [IU] via SUBCUTANEOUS

## 2015-12-31 MED ORDER — DEXTROSE 5 % IV SOLN
2.0000 g | Freq: Once | INTRAVENOUS | Status: AC
  Administered 2015-12-31: 2 g via INTRAVENOUS
  Filled 2015-12-31: qty 2

## 2015-12-31 MED ORDER — DEXTROSE-NACL 5-0.45 % IV SOLN
INTRAVENOUS | Status: DC
  Administered 2016-01-01: via INTRAVENOUS

## 2015-12-31 MED ORDER — VANCOMYCIN HCL 10 G IV SOLR
1250.0000 mg | INTRAVENOUS | Status: DC
  Administered 2016-01-01: 1250 mg via INTRAVENOUS
  Filled 2015-12-31 (×2): qty 1250

## 2015-12-31 MED ORDER — DEXTROSE 5 % IV SOLN
2.0000 g | Freq: Two times a day (BID) | INTRAVENOUS | Status: DC
  Administered 2016-01-01 (×3): 2 g via INTRAVENOUS
  Filled 2015-12-31 (×5): qty 2

## 2015-12-31 MED ORDER — LORAZEPAM 2 MG/ML IJ SOLN
INTRAMUSCULAR | Status: AC
  Filled 2015-12-31: qty 1

## 2015-12-31 MED ORDER — LORAZEPAM 2 MG/ML IJ SOLN
1.0000 mg | Freq: Once | INTRAMUSCULAR | Status: AC
  Administered 2015-12-31: 1 mg via INTRAVENOUS
  Filled 2015-12-31: qty 1

## 2015-12-31 MED ORDER — SODIUM CHLORIDE 0.9 % IV SOLN
INTRAVENOUS | Status: DC
  Administered 2015-12-31: 4.1 [IU]/h via INTRAVENOUS
  Filled 2015-12-31: qty 2.5

## 2015-12-31 MED ORDER — SODIUM CHLORIDE 0.9 % IV SOLN
500.0000 mg | Freq: Two times a day (BID) | INTRAVENOUS | Status: DC
  Administered 2015-12-31 – 2016-01-02 (×5): 500 mg via INTRAVENOUS
  Filled 2015-12-31 (×8): qty 5

## 2015-12-31 MED ORDER — INSULIN ASPART 100 UNIT/ML ~~LOC~~ SOLN: 0.0000 [IU] | SUBCUTANEOUS | Status: DC

## 2015-12-31 MED ORDER — SODIUM CHLORIDE 0.9 % IV SOLN
1000.0000 mg | Freq: Once | INTRAVENOUS | Status: AC
  Administered 2015-12-31: 1000 mg via INTRAVENOUS
  Filled 2015-12-31: qty 10

## 2015-12-31 MED ORDER — VANCOMYCIN HCL 10 G IV SOLR
2000.0000 mg | Freq: Once | INTRAVENOUS | Status: AC
  Administered 2015-12-31: 2000 mg via INTRAVENOUS
  Filled 2015-12-31: qty 2000

## 2015-12-31 MED ORDER — VANCOMYCIN HCL IN DEXTROSE 1-5 GM/200ML-% IV SOLN: 1000.0000 mg | Freq: Once | INTRAVENOUS | Status: DC

## 2015-12-31 MED ORDER — ACETAMINOPHEN 650 MG RE SUPP
650.0000 mg | RECTAL | Status: DC | PRN
  Administered 2015-12-31 – 2016-01-01 (×3): 650 mg via RECTAL
  Filled 2015-12-31 (×3): qty 1

## 2015-12-31 MED ORDER — CETYLPYRIDINIUM CHLORIDE 0.05 % MT LIQD
7.0000 mL | Freq: Two times a day (BID) | OROMUCOSAL | Status: DC
  Administered 2015-12-31 – 2016-01-04 (×7): 7 mL via OROMUCOSAL

## 2015-12-31 MED ORDER — SODIUM CHLORIDE 0.9 % IV SOLN: INTRAVENOUS | Status: DC

## 2015-12-31 MED ORDER — SODIUM CHLORIDE 0.9 % IV SOLN
INTRAVENOUS | Status: DC
Start: 2015-12-31 — End: 2016-01-01
  Administered 2015-12-31: 16:00:00 via INTRAVENOUS

## 2015-12-31 MED ORDER — AMPICILLIN SODIUM 2 G IJ SOLR
2.0000 g | Freq: Four times a day (QID) | INTRAMUSCULAR | Status: DC
  Administered 2015-12-31 – 2016-01-02 (×7): 2 g via INTRAVENOUS
  Filled 2015-12-31 (×12): qty 2000

## 2015-12-31 MED ORDER — HEPARIN SODIUM (PORCINE) 5000 UNIT/ML IJ SOLN
5000.0000 [IU] | Freq: Three times a day (TID) | INTRAMUSCULAR | Status: DC
  Administered 2015-12-31: 5000 [IU] via SUBCUTANEOUS
  Filled 2015-12-31 (×2): qty 1

## 2015-12-31 MED ORDER — PIPERACILLIN-TAZOBACTAM 3.375 G IVPB 30 MIN
3.3750 g | Freq: Once | INTRAVENOUS | Status: AC
Start: 2015-12-31 — End: 2015-12-31
  Administered 2015-12-31: 3.375 g via INTRAVENOUS
  Filled 2015-12-31: qty 50

## 2015-12-31 MED ORDER — SODIUM CHLORIDE 0.9 % IV BOLUS (SEPSIS)
1000.0000 mL | Freq: Once | INTRAVENOUS | Status: AC
  Administered 2015-12-31: 1000 mL via INTRAVENOUS

## 2015-12-31 MED ORDER — HEPARIN SODIUM (PORCINE) 5000 UNIT/ML IJ SOLN
5000.0000 [IU] | Freq: Three times a day (TID) | INTRAMUSCULAR | Status: DC
  Administered 2015-12-31 – 2016-01-04 (×10): 5000 [IU] via SUBCUTANEOUS
  Filled 2015-12-31 (×10): qty 1

## 2015-12-31 NOTE — Progress Notes (Signed)
Pharmacy Antibiotic Note  Carrie Atkins is a 82 y.o. female admitted on 12/31/2015 with AMS and possible sepsis. Pharmacy has been consulted for vancomycin and zosyn dosing.   WBC and lactic acid elevated on admission. Pt currently febrile with SBPs ranging from 89-164 and HR ranging from 116-148.   First doses of antibiotics were handed to RN at 1100 by pharmacy.   Plan: - Zosyn 3.375gm IV over 30 minutes x1 - Vancomycin 2000mg  IV loading dose x1  - Determine vancomycin and zosyn dose once creatine clearance can be assessed.  - Vancomycin goal trough: 15-20 - Monitor kidney function, C&S, and clinical improvement   Height: 5\' 6"  (167.6 cm) Weight: 190 lb (86.183 kg) IBW/kg (Calculated) : 59.3  Temp (24hrs), Avg:102.1 F (38.9 C), Min:101.7 F (38.7 C), Max:102.5 F (39.2 C)   Recent Labs Lab 12/31/15 1110 12/31/15 1123  WBC 17.5*  --   LATICACIDVEN  --  3.22*    CrCl cannot be calculated (Patient has no serum creatinine result on file.).    Allergies  Allergen Reactions  . Hydrocodone   . Sulfonamide Derivatives     Antimicrobials this admission: Vancomycin (7/18) >>  Zosyn (7/18)  >>  Received ceftriaxone x1 in ED (7/18)  Dose adjustments this admission: N/a  Microbiology results: 7/18 BCx x2: in process 7/18 UA: positive for nitrites and moderate leukocytes    Thank you for allowing pharmacy to be a part of this patient's care.  Demetrius Charity, PharmD Acute Care Pharmacy Resident  Pager: [phone] 12/31/2015

## 2015-12-31 NOTE — ED Notes (Signed)
Arrived via EMS patient's family member called EMS. Last seen normal 2030 last night. Called for patient being unresponsive. EMS arrived CBG read HIGH. Non verbal and altered. EMS reported patient normal verbal and ambulatory.

## 2015-12-31 NOTE — ED Provider Notes (Signed)
CSN: [id number]     Arrival date & time 12/31/15  1054 History   First MD Initiated Contact with Patient 12/31/15 1057     Chief Complaint  Patient presents with  . Altered Mental Status  . Fever     (Consider location/radiation/quality/duration/timing/severity/associated sxs/prior Treatment) Patient is a 82 y.o. female presenting with altered mental status. The history is provided by a relative and the EMS personnel.  Altered Mental Status Presenting symptoms: confusion and partial responsiveness   Severity:  Severe Most recent episode:  Today Episode history:  Continuous Timing:  Constant Progression:  Unchanged Chronicity:  New Context: not dementia     Past Medical History  Diagnosis Date  . Diabetes mellitus   . Hypertension   . Hyperlipidemia   . Chest pain   . Chronic lymphocytic leukemia ([place])   . SOB (shortness of breath)   . CLL (chronic lymphocytic leukemia) ([place]) 06/05/2013  . Leukemia [facility])    Past Surgical History  Procedure Laterality Date  . Total knee arthroplasty    . Tonsillectomy    . Cholecystectomy    . US echocardiography  01/10/2007    EF 55-60%  . US echocardiography  02/23/2006     EF 55-60%  . Cardiovascular stress test  05/29/2010    EF 83%   Family History  Problem Relation Age of Onset  . Stroke Mother   . Lung cancer Father   . COPD Brother    Social History  Substance Use Topics  . Smoking status: Never Smoker   . Smokeless tobacco: Never Used  . Alcohol Use: No   OB History    No data available     Review of Systems  Unable to perform ROS: Mental status change  Psychiatric/Behavioral: Positive for confusion.    Allergies  Hydrocodone and Sulfonamide derivatives  Home Medications   Prior to Admission medications   Medication Sig Start Date End Date Taking? Authorizing Provider  acetaminophen (TYLENOL ARTHRITIS PAIN) 650 MG CR tablet Take 1,300 mg by mouth 2 (two) times daily.      Historical Provider, MD   amoxicillin (AMOXIL) 500 MG tablet Take 500 mg by mouth as needed (Dental work).     Historical Provider, MD  aspirin 81 MG tablet Take 81 mg by mouth daily.      Historical Provider, MD  atorvastatin (LIPITOR) 40 MG tablet Take 40 mg by mouth every evening.    Historical Provider, MD  CALCIUM-MAGNESIUM-ZINC PO Take by mouth 2 (two) times daily.      Historical Provider, MD  carvedilol (COREG) 25 MG tablet TAKE 1 TABLET BY MOUTH TWICE DAILY 01/22/15   Thayer Headings, MD  ferrous sulfate 325 (65 FE) MG tablet Take 325 mg by mouth daily with breakfast.      Historical Provider, MD  furosemide (LASIX) 80 MG tablet Take 40 mg by mouth daily.    Historical Provider, MD  glimepiride (AMARYL) 1 MG tablet Take 4 mg by mouth 2 (two) times daily.     Historical Provider, MD  metFORMIN (GLUCOPHAGE) 1000 MG tablet Take 1,000 mg by mouth 2 (two) times daily. 10/05/15   Historical Provider, MD  omeprazole (PRILOSEC) 20 MG capsule Take 20 mg by mouth daily.      Historical Provider, MD  oxycodone (OXY-IR) 5 MG capsule Take 5 mg by mouth every 4 (four) hours as needed.    Historical Provider, MD  PARoxetine (PAXIL) 20 MG tablet Take 20 mg by mouth daily.  03/31/12   Historical Provider, MD  ranitidine (ZANTAC) 150 MG tablet Take 150 mg by mouth 2 (two) times daily as needed for heartburn.    Historical Provider, MD   BP 130/100 mmHg  Pulse 93  Temp(Src) 101.1 F (38.4 C) (Rectal)  Resp 23  Ht 5\' 6"  (1.676 m)  Wt 86.183 kg  BMI 30.68 kg/m2  SpO2 99% Physical Exam  Constitutional: She appears toxic. She appears distressed.  HENT:  Head: Normocephalic and atraumatic.  Eyes: Pupils are equal, round, and reactive to light.  Neck: No JVD present.  Cardiovascular: Regular rhythm.  Tachycardia present.   Pulses:      Radial pulses are 2+ on the right side, and 2+ on the left side.  Pulmonary/Chest: No stridor. No respiratory distress. She has no decreased breath sounds.  Abdominal: Soft. She exhibits no  distension.  Musculoskeletal: She exhibits no edema.  Neurological: GCS eye subscore is 4. GCS verbal subscore is 2. GCS motor subscore is 4.  Skin: Skin is warm and dry.    ED Course  Procedures (including critical care time) Labs Review Labs Reviewed  CBC WITH DIFFERENTIAL/PLATELET - Abnormal; Notable for the following:    WBC 17.5 (*)    Neutro Abs 11.8 (*)    Lymphs Abs 5.3 (*)    All other components within normal limits  URINALYSIS, ROUTINE W REFLEX MICROSCOPIC (NOT AT [facility]) - Abnormal; Notable for the following:    APPearance CLOUDY (*)    Glucose, UA >1000 (*)    Hgb urine dipstick MODERATE (*)    Ketones, ur 40 (*)    Nitrite POSITIVE (*)    Leukocytes, UA MODERATE (*)    All other components within normal limits  URINE MICROSCOPIC-ADD ON - Abnormal; Notable for the following:    Squamous Epithelial / LPF 0-5 (*)    Bacteria, UA MANY (*)    All other components within normal limits  I-STAT CG4 LACTIC ACID, ED - Abnormal; Notable for the following:    Lactic Acid, Venous 3.22 (*)    All other components within normal limits  CBG MONITORING, ED - Abnormal; Notable for the following:    Glucose-Capillary 579 (*)    All other components within normal limits  I-STAT VENOUS BLOOD GAS, ED - Abnormal; Notable for the following:    pH, Ven 7.238 (*)    Bicarbonate 19.5 (*)    Acid-base deficit 8.0 (*)    All other components within normal limits  CBG MONITORING, ED - Abnormal; Notable for the following:    Glucose-Capillary 502 (*)    All other components within normal limits  I-STAT ARTERIAL BLOOD GAS, ED - Abnormal; Notable for the following:    pH, Arterial 7.272 (*)    pCO2 arterial 27.8 (*)    pO2, Arterial 156.0 (*)    Bicarbonate 12.6 (*)    Acid-base deficit 12.0 (*)    All other components within normal limits  CULTURE, BLOOD (ROUTINE X 2)  CULTURE, BLOOD (ROUTINE X 2)  URINE CULTURE  CK  CBC  CREATININE, SERUM  MAGNESIUM  PHOSPHORUS  TROPONIN I   TROPONIN I  TROPONIN I  LACTIC ACID, PLASMA  PROCALCITONIN  PROTIME-INR  APTT  BLOOD GAS, ARTERIAL  HEMOGLOBIN A1C  COMPREHENSIVE METABOLIC PANEL  BETA-HYDROXYBUTYRIC ACID  I-STAT CG4 LACTIC ACID, ED    Imaging Review Ct Head Wo Contrast  12/31/2015  CLINICAL DATA:  Septic with fever and new onset seizures. EXAM: CT HEAD WITHOUT CONTRAST  TECHNIQUE: Contiguous axial images were obtained from the base of the skull through the vertex without intravenous contrast. COMPARISON:  None. FINDINGS: Ventricles, cisterns and other CSF spaces are within normal. There is no mass, mass effect, shift of midline structures or acute hemorrhage. There is mild chronic ischemic microvascular disease. No evidence of acute infarction. Bones and soft tissues are within normal. IMPRESSION: No acute intracranial findings. Chronic ischemic microvascular disease. Electronically Signed   By: Marin Olp M.D.   On: 12/31/2015 13:41   Dg Chest Portable 1 View  12/31/2015  CLINICAL DATA:  Unresponsive. EXAM: PORTABLE CHEST 1 VIEW COMPARISON:  05/20/2010. FINDINGS: 1110 hours. Lung volumes are low. The cardio pericardial silhouette is enlarged. There is pulmonary vascular congestion without overt pulmonary edema. Interstitial pulmonary edema not excluded. No substantial pleural effusion. The visualized bony structures of the thorax are intact. Telemetry leads overlie the chest. IMPRESSION: Cardiomegaly with vascular congestion and possible interstitial pulmonary edema. Electronically Signed   By: Misty Stanley M.D.   On: 12/31/2015 11:35   I have personally reviewed and evaluated these images and lab results as part of my medical decision-making.   EKG Interpretation   Date/Time:  Tuesday December 31 2015 11:14:46 EDT Ventricular Rate:  140 PR Interval:    QRS Duration: 118 QT Interval:  305 QTC Calculation: 466 R Axis:   11 Text Interpretation:  TECHNICALLY DIFFICULT Nonspecific intraventricular  conduction delay  Low voltage, precordial leads Artifact in lead(s) I II  III aVR aVL aVF V1 V2 V3 Confirmed by Reather Converse MD, Vonna Kotyk [id number]) on  12/31/2015 12:03:44 PM Also confirmed by Reather Converse MD, JOSHUA [phone]), editor  [place]  CCT, BEVERLY ([id number])  on 12/31/2015 12:05:03 PM      MDM   Final diagnoses:  Sepsis, due to unspecified organism ([place])  Altered mental status, unspecified altered mental status type    The patient is a 82 year old female with hypertension, hyperlipidemia, CLL presenting to the emergency department with fever up to 103 tachycardia to 140 and altered mental status per EMS.  EMS reports was called to seen by daughter who reported altered mental status. History obtained from EMS and from the daughter. Daughter reports no abnormalities over the last several days. Only reports today head abnormal clenching of her right hand but otherwise well-appearing.  On evaluation the patient was taken 140 and febrile to 102.5. Good sepsis initiated with 30 mL per kg of fluids given and broad-spectrum antibiotics given. Nursing reports seizure activity at bedside but was not witnessed by myself. Given Ativan at bedside and Keppra load.  Due to this added on 2 g of Rocephin for coverage of possible meningitis. Neurology was consulted who recommended lumbar puncture. CT head performed showing no acute intracranial abnormalities.  CBC with leukocytosis to 17.5. No hypotension and no elevated creatinine. Lactic acid mildly elevated with pH of 7.22 and hyperglycemia to 500s. Likely hyperglycemia secondary to stress response to infection and less likely primary DKA. Likely mixed acidosis. Mental status remained a GCS of 10. UA this morning UTI. Consulted critical care who evaluated the patient in the emergency department and agreed to admission.  The pt was moved from the ED prior to LP being able to be performed due to high acuity in separate areas.   Labs were viewed by myself and incorporated into medical  decision making.  Discussed pertinent finding with patient or caregiver prior to admission with no further questions.  Pt care supervised by my attending Dr. Reather Converse.   Rodman Key  Liston Alba, MD PGY-3 Emergency Medicine     Geronimo Boot, MD 12/31/15 [place]  Elnora Morrison, MD 12/31/15 [phone]

## 2015-12-31 NOTE — Progress Notes (Signed)
CRITICAL VALUE ALERT  Critical value received:  Glucose 533  Date of notification:  12/31/15  Time of notification:  [id number]   Critical value read back:yes  Nurse who received alert:  Regino Schultze RN  MD notified (1st page):  Emmit Alexanders - MD responded, orders given  Time of first page:  1610  MD notified (2nd page):  Time of second page:  Responding MD:  Emmit Alexanders MD  Time MD responded:  [phone]

## 2015-12-31 NOTE — ED Notes (Signed)
Activated Code Sepsis 

## 2015-12-31 NOTE — ED Notes (Signed)
Nurse notified secretary to call a code sepsis.

## 2015-12-31 NOTE — Progress Notes (Signed)
Critical lactic acid 2.0 reported to E-MD (Dr. Oletta Darter)

## 2015-12-31 NOTE — ED Notes (Signed)
Per main lab CMP again hemolysis'd -re-ordered

## 2015-12-31 NOTE — Progress Notes (Signed)
eLink Physician-Brief Progress Note Patient Name: Carrie Atkins DOB: 1933/09/20 MRN: [id number]   Date of Service  12/31/2015  HPI/Events of Note  Troponin = 0.03.   eICU Interventions  Continue to trend Troponin.      Intervention Category Intermediate Interventions: Diagnostic test evaluation  Lysle Dingwall 12/31/2015, 5:55 PM

## 2015-12-31 NOTE — Progress Notes (Signed)
eLink Physician-Brief Progress Note Patient Name: Carrie Atkins DOB: [birth year] MRN: [id number]   Date of Service  12/31/2015  HPI/Events of Note  Blood glucose = 533.   eICU Interventions  Will give empiric dose of Novolog 12 units  now and recheck her blood glucose in 4 hours. If not improved, will require an Insulin IV infusion.      Intervention Category Intermediate Interventions: Hyperglycemia - evaluation and treatment  Sommer,Steven Eugene 12/31/2015, 4:11 PM

## 2015-12-31 NOTE — Procedures (Signed)
ELECTROENCEPHALOGRAM REPORT  Date of Study: 12/31/2015  Patient's Name: Carrie Atkins MRN: [id number] Date of Birth: [birth year]  Referring Provider: Salvadore Dom, NP  Indication: 82 year old woman found confused with bowel and bladder incontinence.  Medications: Keppra Pensions consultant Summary: This is a multichannel digital EEG recording, using the international 10-20 placement system with electrodes applied with paste and impedances below 5000 ohms.    Description: The EEG background is symmetric with diffuse background slowing and no discernible posterior dominant rhythm.  There is no focal or generalized epileptiform discharges.  The recording is limited due to muscle artifact.  Stage II sleep is not seen, with normal and symmetric sleep patterns.  Hyperventilation and photic stimulation were not performed.  ECG is compromised by artifact.  Impression: This is an abnormal EEG due to generalized slowing, indicative of diffuse cerebral dysfunction.  This is a nonspecific finding which may be due to toxic-metabolic, hypoxic or pharmacologic etiology,postictal state, or other diffuse physiologic dysfunction.    Kersti Scavone R. Tomi Likens, DO

## 2015-12-31 NOTE — ED Notes (Signed)
Patients daughter now at bedside and reports that patient lives in apartment that is attached to her home. Last night patient was alert and only complained of right hand pain that she stated result of arthritis. This am when daughter went to check on patient found her on floor at bottom of bed, nude and groaning. Patient has remote hx of leukemia and diabetes that she is treated with oral medications.

## 2015-12-31 NOTE — ED Notes (Signed)
Patient transported to CT 

## 2015-12-31 NOTE — H&P (Signed)
PULMONARY / CRITICAL CARE MEDICINE   Name: Carrie Atkins MRN: [id number] DOB: 22-Feb-1934    ADMISSION DATE:  12/31/2015 CONSULTATION DATE:  7/18  REFERRING MD:  Reather Converse   CHIEF COMPLAINT:  Acute encephalopathy   HISTORY OF PRESENT ILLNESS:   This is a 82 year old female w/ multiple co-morbids including CLL and DM. Followed by neurology for severe diabetic neuropathy and gait disturbance. Presents to the ER after being found the am of 7/18 on floor naked, and incontinent of bowel and bladder. She would only verbalize 1 word phrases. Reportedly in route had violent shaking  Of upper extremities. On eval found to be febrile, have mild lactic acid, and UA c/w UTI. She was treated w/ IV hydration, cultures were obtained, empiric abx were given. She again had significant upper extremity tonic/clonic activity. She was treated w/ keppra and IV ativan. PCCM was asked to see given her acute encephalopathy and concern about need for intubation.   PAST MEDICAL HISTORY :  She  has a past medical history of Diabetes mellitus; Hypertension; Hyperlipidemia; Chest pain; Chronic lymphocytic leukemia ([place]); SOB (shortness of breath); CLL (chronic lymphocytic leukemia) ([place]) (06/05/2013); and Leukemia ([place]).  PAST SURGICAL HISTORY: She  has past surgical history that includes Total knee arthroplasty; Tonsillectomy; Cholecystectomy; US ECHOCARDIOGRAPHY (01/10/2007); US ECHOCARDIOGRAPHY (02/23/2006); and Cardiovascular stress test (05/29/2010).  Allergies  Allergen Reactions  . Hydrocodone   . Sulfonamide Derivatives     No current facility-administered medications on file prior to encounter.   Current Outpatient Prescriptions on File Prior to Encounter  Medication Sig  . acetaminophen (TYLENOL ARTHRITIS PAIN) 650 MG CR tablet Take 1,300 mg by mouth 2 (two) times daily.    Marland Kitchen amoxicillin (AMOXIL) 500 MG tablet Take 500 mg by mouth as needed (Dental work).   Marland Kitchen aspirin 81 MG tablet Take 81 mg by mouth daily.     Marland Kitchen atorvastatin (LIPITOR) 40 MG tablet Take 40 mg by mouth every evening.  Marland Kitchen CALCIUM-MAGNESIUM-ZINC PO Take by mouth 2 (two) times daily.    . carvedilol (COREG) 25 MG tablet TAKE 1 TABLET BY MOUTH TWICE DAILY  . ferrous sulfate 325 (65 FE) MG tablet Take 325 mg by mouth daily with breakfast.    . furosemide (LASIX) 80 MG tablet Take 40 mg by mouth daily.  Marland Kitchen glimepiride (AMARYL) 1 MG tablet Take 4 mg by mouth 2 (two) times daily.   . metFORMIN (GLUCOPHAGE) 1000 MG tablet Take 1,000 mg by mouth 2 (two) times daily.  Marland Kitchen omeprazole (PRILOSEC) 20 MG capsule Take 20 mg by mouth daily.    Marland Kitchen oxycodone (OXY-IR) 5 MG capsule Take 5 mg by mouth every 4 (four) hours as needed.  Marland Kitchen PARoxetine (PAXIL) 20 MG tablet Take 20 mg by mouth daily.   . ranitidine (ZANTAC) 150 MG tablet Take 150 mg by mouth 2 (two) times daily as needed for heartburn.    FAMILY HISTORY:  Her indicated that her mother is deceased. She indicated that her father is deceased. She indicated that her sister is alive. She indicated that her brother is deceased.   SOCIAL HISTORY: She  reports that she has never smoked. She has never used smokeless tobacco. She reports that she does not drink alcohol or use illicit drugs.  REVIEW OF SYSTEMS:   Unable   SUBJECTIVE:  Sedated   VITAL SIGNS: BP 89/63 mmHg  Pulse 122  Temp(Src) 101.7 F (38.7 C) (Rectal)  Resp 24  Ht 5\' 6"  (1.676 m)  Wt  190 lb (86.183 kg)  BMI 30.68 kg/m2  SpO2 100%  HEMODYNAMICS:    VENTILATOR SETTINGS:    INTAKE / OUTPUT:    PHYSICAL EXAMINATION: General:  Chronically ill appearing white female, lethargic. Slow to arouse  Neuro:  Awakens only to gentle physical stimulation, will answer short word answers. No nuchal rigidity  HEENT: NCAT, MMM, no JVD, intermittent right gaze pref  Cardiovascular:  RRR no MRG Lungs:  Clear and no accessory use  Abdomen:  Soft, not tender  Musculoskeletal:  Left weak. Equal st and bulk Skin:  Warm and dry    LABS:  BMET No results for input(s): NA, K, CL, CO2, BUN, CREATININE, GLUCOSE in the last 168 hours.  Electrolytes No results for input(s): CALCIUM, MG, PHOS in the last 168 hours.  CBC  Recent Labs Lab 12/31/15 1110  WBC 17.5*  HGB 13.5  HCT 39.2  PLT 216    Coag's No results for input(s): APTT, INR in the last 168 hours.  Sepsis Markers  Recent Labs Lab 12/31/15 1123  LATICACIDVEN 3.22*    ABG No results for input(s): PHART, PCO2ART, PO2ART in the last 168 hours.  Liver Enzymes No results for input(s): AST, ALT, ALKPHOS, BILITOT, ALBUMIN in the last 168 hours.  Cardiac Enzymes No results for input(s): TROPONINI, PROBNP in the last 168 hours.  Glucose  Recent Labs Lab 12/31/15 1104  GLUCAP 579*    Imaging Ct Head Wo Contrast  12/31/2015  CLINICAL DATA:  Septic with fever and new onset seizures. EXAM: CT HEAD WITHOUT CONTRAST TECHNIQUE: Contiguous axial images were obtained from the base of the skull through the vertex without intravenous contrast. COMPARISON:  None. FINDINGS: Ventricles, cisterns and other CSF spaces are within normal. There is no mass, mass effect, shift of midline structures or acute hemorrhage. There is mild chronic ischemic microvascular disease. No evidence of acute infarction. Bones and soft tissues are within normal. IMPRESSION: No acute intracranial findings. Chronic ischemic microvascular disease. Electronically Signed   By: Marin Olp M.D.   On: 12/31/2015 13:41   Dg Chest Portable 1 View  12/31/2015  CLINICAL DATA:  Unresponsive. EXAM: PORTABLE CHEST 1 VIEW COMPARISON:  05/20/2010. FINDINGS: 1110 hours. Lung volumes are low. The cardio pericardial silhouette is enlarged. There is pulmonary vascular congestion without overt pulmonary edema. Interstitial pulmonary edema not excluded. No substantial pleural effusion. The visualized bony structures of the thorax are intact. Telemetry leads overlie the chest. IMPRESSION: Cardiomegaly  with vascular congestion and possible interstitial pulmonary edema. Electronically Signed   By: Misty Stanley M.D.   On: 12/31/2015 11:35     STUDIES:  CT HEAD 7/18: negative for acute findings EEG 7/18>>> MRI 7/18>>>  CULTURES: UC 7/18>>> BCX2 7/18>>>  ANTIBIOTICS: vanc 7/18>>> Zosyn 7/18>>>  SIGNIFICANT EVENTS:   LINES/TUBES:   DISCUSSION: This is a 82 year old female who presents to ED after being found acutely encephalopathic w/ left sided weakness. She had what appeared to be seizure activity while in ED and in route to ER. She is slow to arouse & requires physical stimulation. GCS 9. It is unclear why she is encephalopathic; ? Sepsis, ? Seizure ? CVA.Marland Kitchen Not clear that what was witnessed was actually fever but does have left sided weakness. We will admit to ICU, f/u eeg, pan-culture, place on empiric abx, hydrate and await pending chemistries as these could certainly be playing a major role in her treatment plan.   ASSESSMENT / PLAN:  PULMONARY A: At risk for airway  compromise  P:   Pulse ox Aspiration precautions  Short term intubation only per son's request.  CARDIOVASCULAR A:  SIRS/sepsis-->not in shock.  P:  Repeat lactic acid Cont IVFs Admit to ICU  RENAL A:   Lactic acidosis. Either d/t seizure of sepsis or both. Lab work pending  P:   F/u lactic acid F/u chemistry  Strict I&O BMET in AM DKA protocol Replace electrolytes as indicated.  GASTROINTESTINAL A:   H/o dysphagia per family P:   NPO Will need SLP when improved.  HEMATOLOGIC A:   Leukocytosis  H/o CLL P:  Trend cbc [place] heparin   INFECTIOUS A:   UTI sepsis P:   Repeat lactic acid aftert 30 ml/kg fluid challenge Amp/rocephin/vanc.  ENDOCRINE A:   Hyperglycemia w/ h/o DM, ketones in urine and AG of 14=DKA P:   Ck a1c DKA protocol.  NEUROLOGIC A:   Acute Encephalopathy ->CT negative for acute -->concerned about CVA vs infection. Has left sided weakness  R/o seizure  vs could this be simply rigors in setting to fever  H/o diabetic Neuropathy and chronic gait disturbance  P:   RASS goal: 0 EEG ordered stat and Neuro to follow MRI brain Cont keppra May need to consider LP; but neck is not stiff, will defer to neuro.  FAMILY  - Updates: Daughter updated by Laurey Arrow. - Inter-disciplinary family meet or Palliative Care meeting due by:  7/25  Carrie Atkins ACNP-BC [place] Pager # [phone] OR # [phone] if no answer   12/31/2015, 1:48 PM  Attending Note:  Above note edited including physical exam and A/P.  82 year old female with an extensive PMH who presents to the hospital with AMS and ?of seizure.  Patient is DKA and was febrile so "seizure" are most likely rigors.  Patient given ativan.  Concern for airway protection.  Will not intubate for now but monitor in the ICU, if needs intubation then will be short term intubation only.  Will treat as DKA and treat as PNA and meningitis for now awaiting cultures return.  Neuro help appreciated.  The patient is critically ill with multiple organ systems failure and requires high complexity decision making for assessment and support, frequent evaluation and titration of therapies, application of advanced monitoring technologies and extensive interpretation of multiple databases.   Critical Care Time devoted to patient care services described in this note is  35  Minutes. This time reflects time of care of this signee Dr Jennet Maduro. This critical care time does not reflect procedure time, or teaching time or supervisory time of PA/NP/Med student/Med Resident etc but could involve care discussion time.  Rush Farmer, M.D. [facility] Pulmonary/Critical Care Medicine. Pager: [phone]. After hours pager: [phone].

## 2015-12-31 NOTE — ED Notes (Signed)
Returned from [place] with this RN. CCM remains at bedside discussing patient with family. EEG being done at bedside.

## 2015-12-31 NOTE — ED Notes (Signed)
Patient having arm jerking and gaze to right, additional ativan ordered

## 2015-12-31 NOTE — Progress Notes (Signed)
EEG completed bedside. Full report to follow. °

## 2015-12-31 NOTE — Consult Note (Signed)
NEURO HOSPITALIST CONSULT NOTE   Requestig physician: Dr. Steffanie Dunn   Reason for Consult:seizure   History obtained from: Family  HPI:                                                                                                                                          Carrie Atkins is an 82 y.o. female who was found by daughter incontinent of urine and fecal matter along with confusion. EMS was called and in route patient was noted to have multiple bilateral TC activity with eyes deviated to the right. Currently she is lethargic, not following commands but localizes to pain with the right arm and withdraws from pain bilateral in the legs. She exhibits multifocal myoclonic activity at rest. Looking through her medications there does not seem to have any medications which would lower a seizure threshold. UA shows positive for Nitrites and Leukocytes.   Past Medical History  Diagnosis Date  . Diabetes mellitus   . Hypertension   . Hyperlipidemia   . Chest pain   . Chronic lymphocytic leukemia ([place])   . SOB (shortness of breath)   . CLL (chronic lymphocytic leukemia) ([place]) 06/05/2013  . Leukemia [facility])     Past Surgical History  Procedure Laterality Date  . Total knee arthroplasty    . Tonsillectomy    . Cholecystectomy    . US echocardiography  01/10/2007    EF 55-60%  . US echocardiography  02/23/2006     EF 55-60%  . Cardiovascular stress test  05/29/2010    EF 83%    Family History  Problem Relation Age of Onset  . Stroke Mother   . Lung cancer Father   . COPD Brother      Social History:  reports that she has never smoked. She has never used smokeless tobacco. She reports that she does not drink alcohol or use illicit drugs.  Allergies  Allergen Reactions  . Hydrocodone   . Sulfonamide Derivatives     MEDICATIONS:                                                                                                                     Current  Facility-Administered Medications  Medication Dose Route Frequency Provider Last Rate Last Dose  .  acetaminophen (TYLENOL) suppository 650 mg  650 mg Rectal Q4H PRN Elnora Morrison, MD   650 mg at 12/31/15 1133  . levETIRAcetam (KEPPRA) 500 mg in sodium chloride 0.9 % 100 mL IVPB  500 mg Intravenous Q12H Marliss Coots, PA-C      . vancomycin (VANCOCIN) 2,000 mg in sodium chloride 0.9 % 500 mL IVPB  2,000 mg Intravenous Once Delane Ginger, RPH 250 mL/hr at 12/31/15 1124 2,000 mg at 12/31/15 1124   Current Outpatient Prescriptions  Medication Sig Dispense Refill  . acetaminophen (TYLENOL ARTHRITIS PAIN) 650 MG CR tablet Take 1,300 mg by mouth 2 (two) times daily.      Marland Kitchen amoxicillin (AMOXIL) 500 MG tablet Take 500 mg by mouth as needed (Dental work).     Marland Kitchen aspirin 81 MG tablet Take 81 mg by mouth daily.      Marland Kitchen atorvastatin (LIPITOR) 40 MG tablet Take 40 mg by mouth every evening.    Marland Kitchen CALCIUM-MAGNESIUM-ZINC PO Take by mouth 2 (two) times daily.      . carvedilol (COREG) 25 MG tablet TAKE 1 TABLET BY MOUTH TWICE DAILY 180 tablet 3  . ferrous sulfate 325 (65 FE) MG tablet Take 325 mg by mouth daily with breakfast.      . furosemide (LASIX) 80 MG tablet Take 40 mg by mouth daily.    Marland Kitchen glimepiride (AMARYL) 1 MG tablet Take 4 mg by mouth 2 (two) times daily.     . metFORMIN (GLUCOPHAGE) 1000 MG tablet Take 1,000 mg by mouth 2 (two) times daily.    Marland Kitchen omeprazole (PRILOSEC) 20 MG capsule Take 20 mg by mouth daily.      Marland Kitchen oxycodone (OXY-IR) 5 MG capsule Take 5 mg by mouth every 4 (four) hours as needed.    Marland Kitchen PARoxetine (PAXIL) 20 MG tablet Take 20 mg by mouth daily.     . ranitidine (ZANTAC) 150 MG tablet Take 150 mg by mouth 2 (two) times daily as needed for heartburn.        ROS:                                                                                                                                       History obtained from unobtainable from patient due to mental status    Blood  pressure 137/56, pulse 116, temperature 101.8 F (38.8 C), temperature source Rectal, resp. rate 20, height 5\' 6"  (1.676 m), weight 190 lb (86.183 kg), SpO2 100 %.   Neurologic Examination:  HEENT-  Normocephalic, no lesions, without obvious abnormality.  Normal external eye and conjunctiva.  Normal TM's bilaterally.  Normal auditory canals and external ears. Normal external nose, mucus membranes and septum.  Normal pharynx. Cardiovascular- S1, S2 normal, pulses palpable throughout   Lungs- chest clear, no wheezing, rales, normal symmetric air entry Abdomen- normal findings: bowel sounds normal Extremities- no edema Lymph-no adenopathy palpable Musculoskeletal-no joint tenderness, deformity or swelling Skin-warm and dry, no hyperpigmentation, vitiligo, or suspicious lesions  Neurological Examination Mental Status: Follows no commands, localizes to pain. Nonverbal.  Cranial Nerves: II: no blink to threat, pupils equal, round, reactive to light and accommodation III,IV, VI: keeps eyes closed, Doll's intact V,VII: face symmetric VIII: unable to assess IX,X: not able to assess XI: not able to assess XII:not able to assess Motor: Withdraws from pain in right arm and bilateral legs with antigravity strength. No withdrawal from pain on the left arm.  Sensory: Pinprick and light touch intact throughout, bilaterally Deep Tendon Reflexes: 1+ and symmetric throughout Plantars: Right: downgoing   Left: downgoing Cerebellar: Not able to assess Gait: not able to assess      Lab Results: Basic Metabolic Panel: No results for input(s): NA, K, CL, CO2, GLUCOSE, BUN, CREATININE, CALCIUM, MG, PHOS in the last 168 hours.  Liver Function Tests: No results for input(s): AST, ALT, ALKPHOS, BILITOT, PROT, ALBUMIN in the last 168 hours. No results for input(s): LIPASE, AMYLASE in the last 168  hours. No results for input(s): AMMONIA in the last 168 hours.  CBC:  Recent Labs Lab 12/31/15 1110  WBC 17.5*  NEUTROABS 11.8*  HGB 13.5  HCT 39.2  MCV 93.3  PLT 216    Cardiac Enzymes: No results for input(s): CKTOTAL, CKMB, CKMBINDEX, TROPONINI in the last 168 hours.  Lipid Panel: No results for input(s): CHOL, TRIG, HDL, CHOLHDL, VLDL, LDLCALC in the last 168 hours.  CBG:  Recent Labs Lab 12/31/15 1104  [id number]*    Microbiology: Results for orders placed or performed in visit on 05/14/10  Smear     Status: None   Collection Time: 05/16/10  2:21 PM  Result Value Ref Range Status   Smear Result Smear Available  Final    Coagulation Studies: No results for input(s): LABPROT, INR in the last 72 hours.  Imaging: Dg Chest Portable 1 View  12/31/2015  CLINICAL DATA:  Unresponsive. EXAM: PORTABLE CHEST 1 VIEW COMPARISON:  05/20/2010. FINDINGS: 1110 hours. Lung volumes are low. The cardio pericardial silhouette is enlarged. There is pulmonary vascular congestion without overt pulmonary edema. Interstitial pulmonary edema not excluded. No substantial pleural effusion. The visualized bony structures of the thorax are intact. Telemetry leads overlie the chest. IMPRESSION: Cardiomegaly with vascular congestion and possible interstitial pulmonary edema. Electronically Signed   By: Misty Stanley M.D.   On: 12/31/2015 11:35       Assessment and plan per attending neurologist  Etta Quill PA-C [facility] [phone]  12/31/2015, 1:23 PM   Assessment/Plan: 26 female with new onset seizure in setting of UTI and possible sepsis with Temperature 101.8.  En route given ativan 1 mg and 1 gram Keppra while in ED. ED resident will obtain LP. My suspicion is this is septic encephaloapthy, but CNS invovlement will need to be assessed.    1) recommend lumbar puncture, have discussed with ED staff who will perform.  2) Stat EEG 3) keppra 500mg  BID.  4) will  follow.   Roland Rack, MD [facility] [phone]  If 7pm-  7am, please page neurology on call as listed in [place].

## 2015-12-31 NOTE — ED Notes (Signed)
Patient has had a total of 2500 NS.

## 2015-12-31 NOTE — Progress Notes (Signed)
Pharmacy Antibiotic Note  Carrie Atkins is a 82 y.o. female admitted on 12/31/2015 with AMS and possible sepsis.  Patient was started on empirical vancomycin and Zosyn.  Now concerned with meningitis and Zosyn to be switched to Rocephin and ampicillin.  Noted patient received vancomycin and Rocephin in the ED today.  Baseline labs reviewed.  Plan: - Vanc 1250mg  IV Q24H, start tomorrow - CTX 2gm IV Q12H, start at MN - Ampicillin 2gm IV Q6H - Monitor renal fxn, clinical progress, vanc trough at Css  Height: 5\' 6"  (167.6 cm) Weight: 184 lb 11.9 oz (83.8 kg) IBW/kg (Calculated) : 59.3  Temp (24hrs), Avg:101.4 F (38.6 C), Min:98.2 F (36.8 C), Max:102.5 F (39.2 C)   Recent Labs Lab 12/31/15 1110 12/31/15 1123 12/31/15 1448  WBC 17.5*  --   --   CREATININE  --   --  1.36*  LATICACIDVEN  --  3.22* 2.0*    Estimated Creatinine Clearance: 36 mL/min (by C-G formula based on Cr of 1.36).    Allergies  Allergen Reactions  . Hydrocodone   . Sulfonamide Derivatives     Antimicrobials this admission: Vanc 7/18 >> Zosyn 7/18 >> 7/18 CTX 7/18 >> Amp 7/18 >>  Dose adjustments this admission: N/A  Microbiology results: 7/18 MRSA PCR - 7/18 BCx x2 -  7/18 UCx -    Carrie Atkins D. Mina Marble, PharmD, BCPS Pager:  [phone] 12/31/2015, 3:58 PM

## 2016-01-01 ENCOUNTER — Inpatient Hospital Stay (HOSPITAL_COMMUNITY): Payer: Medicare Other

## 2016-01-01 DIAGNOSIS — A419 Sepsis, unspecified organism: Secondary | ICD-10-CM | POA: Insufficient documentation

## 2016-01-01 DIAGNOSIS — R739 Hyperglycemia, unspecified: Secondary | ICD-10-CM | POA: Insufficient documentation

## 2016-01-01 LAB — CBC
HEMATOCRIT: 33.2 % — AB (ref 36.0–46.0)
HEMOGLOBIN: 11.2 g/dL — AB (ref 12.0–15.0)
MCH: 31.7 pg (ref 26.0–34.0)
MCHC: 33.7 g/dL (ref 30.0–36.0)
MCV: 94.1 fL (ref 78.0–100.0)
Platelets: 147 10*3/uL — ABNORMAL LOW (ref 150–400)
RBC: 3.53 MIL/uL — ABNORMAL LOW (ref 3.87–5.11)
RDW: 12.7 % (ref 11.5–15.5)
WBC: 10 10*3/uL (ref 4.0–10.5)

## 2016-01-01 LAB — BASIC METABOLIC PANEL
ANION GAP: 7 (ref 5–15)
ANION GAP: 8 (ref 5–15)
Anion gap: 3 — ABNORMAL LOW (ref 5–15)
BUN: 13 mg/dL (ref 6–20)
BUN: 13 mg/dL (ref 6–20)
BUN: 12 mg/dL (ref 6–20)
CALCIUM: 8.1 mg/dL — AB (ref 8.9–10.3)
CALCIUM: 8.6 mg/dL — AB (ref 8.9–10.3)
CHLORIDE: 116 mmol/L — AB (ref 101–111)
CHLORIDE: 120 mmol/L — AB (ref 101–111)
CO2: 18 mmol/L — ABNORMAL LOW (ref 22–32)
CO2: 21 mmol/L — ABNORMAL LOW (ref 22–32)
CO2: 22 mmol/L (ref 22–32)
CREATININE: 0.98 mg/dL (ref 0.44–1.00)
Calcium: 8.1 mg/dL — ABNORMAL LOW (ref 8.9–10.3)
Chloride: 114 mmol/L — ABNORMAL HIGH (ref 101–111)
Creatinine, Ser: 1.1 mg/dL — ABNORMAL HIGH (ref 0.44–1.00)
Creatinine, Ser: 1.13 mg/dL — ABNORMAL HIGH (ref 0.44–1.00)
GFR calc Af Amer: 53 mL/min — ABNORMAL LOW (ref >60)
GFR calc Af Amer: 52 mL/min — ABNORMAL LOW (ref >60)
GFR calc Af Amer: >60 mL/min (ref >60)
GFR calc non Af Amer: 53 mL/min — ABNORMAL LOW (ref >60)
GFR, EST NON AFRICAN AMERICAN: 45 mL/min — AB (ref >60)
GFR, EST NON AFRICAN AMERICAN: 46 mL/min — AB (ref >60)
GLUCOSE: 164 mg/dL — AB (ref 65–99)
GLUCOSE: 292 mg/dL — AB (ref 65–99)
GLUCOSE: 184 mg/dL — AB (ref 65–99)
POTASSIUM: 3.3 mmol/L — AB (ref 3.5–5.1)
POTASSIUM: 3.2 mmol/L — AB (ref 3.5–5.1)
Potassium: 3.1 mmol/L — ABNORMAL LOW (ref 3.5–5.1)
SODIUM: 140 mmol/L (ref 135–145)
Sodium: 145 mmol/L (ref 135–145)
Sodium: 144 mmol/L (ref 135–145)

## 2016-01-01 LAB — GLUCOSE, CAPILLARY
GLUCOSE-CAPILLARY: 158 mg/dL — AB (ref 65–99)
GLUCOSE-CAPILLARY: 159 mg/dL — AB (ref 65–99)
GLUCOSE-CAPILLARY: 172 mg/dL — AB (ref 65–99)
GLUCOSE-CAPILLARY: 182 mg/dL — AB (ref 65–99)
GLUCOSE-CAPILLARY: 192 mg/dL — AB (ref 65–99)
GLUCOSE-CAPILLARY: 193 mg/dL — AB (ref 65–99)
GLUCOSE-CAPILLARY: 349 mg/dL — AB (ref 65–99)
GLUCOSE-CAPILLARY: 364 mg/dL — AB (ref 65–99)
GLUCOSE-CAPILLARY: 423 mg/dL — AB (ref 65–99)
Glucose-Capillary: 107 mg/dL — ABNORMAL HIGH (ref 65–99)
Glucose-Capillary: 152 mg/dL — ABNORMAL HIGH (ref 65–99)
Glucose-Capillary: 160 mg/dL — ABNORMAL HIGH (ref 65–99)
Glucose-Capillary: 166 mg/dL — ABNORMAL HIGH (ref 65–99)
Glucose-Capillary: 171 mg/dL — ABNORMAL HIGH (ref 65–99)
Glucose-Capillary: 173 mg/dL — ABNORMAL HIGH (ref 65–99)
Glucose-Capillary: 173 mg/dL — ABNORMAL HIGH (ref 65–99)
Glucose-Capillary: 259 mg/dL — ABNORMAL HIGH (ref 65–99)
Glucose-Capillary: 472 mg/dL — ABNORMAL HIGH (ref 65–99)

## 2016-01-01 LAB — TROPONIN I: TROPONIN I: 0.04 ng/mL — AB (ref <0.03)

## 2016-01-01 LAB — HEMOGLOBIN A1C
Hgb A1c MFr Bld: 12.4 % — ABNORMAL HIGH (ref 4.8–5.6)
Mean Plasma Glucose: 309 mg/dL

## 2016-01-01 LAB — PHOSPHORUS: Phosphorus: 2.3 mg/dL — ABNORMAL LOW (ref 2.5–4.6)

## 2016-01-01 LAB — MAGNESIUM: MAGNESIUM: 1.6 mg/dL — AB (ref 1.7–2.4)

## 2016-01-01 LAB — PROCALCITONIN: PROCALCITONIN: 0.11 ng/mL

## 2016-01-01 MED ORDER — POTASSIUM CHLORIDE 10 MEQ/100ML IV SOLN
10.0000 meq | INTRAVENOUS | Status: AC
  Administered 2016-01-01 (×2): 10 meq via INTRAVENOUS
  Filled 2016-01-01 (×2): qty 100

## 2016-01-01 MED ORDER — INSULIN ASPART 100 UNIT/ML ~~LOC~~ SOLN
2.0000 [IU] | SUBCUTANEOUS | Status: DC
  Administered 2016-01-01: 4 [IU] via SUBCUTANEOUS
  Administered 2016-01-01: 6 [IU] via SUBCUTANEOUS

## 2016-01-01 MED ORDER — KCL IN DEXTROSE-NACL 40-5-0.45 MEQ/L-%-% IV SOLN
INTRAVENOUS | Status: DC
  Administered 2016-01-01 – 2016-01-02 (×4): via INTRAVENOUS
  Filled 2016-01-01 (×4): qty 1000

## 2016-01-01 MED ORDER — MAGNESIUM SULFATE 2 GM/50ML IV SOLN
2.0000 g | Freq: Once | INTRAVENOUS | Status: AC
  Administered 2016-01-01: 2 g via INTRAVENOUS
  Filled 2016-01-01: qty 50

## 2016-01-01 MED ORDER — SODIUM CHLORIDE 0.9 % IV BOLUS (SEPSIS)
500.0000 mL | Freq: Once | INTRAVENOUS | Status: AC
  Administered 2016-01-01: 500 mL via INTRAVENOUS

## 2016-01-01 MED ORDER — LIDOCAINE HCL (PF) 1 % IJ SOLN
2.0000 mL | Freq: Once | INTRAMUSCULAR | Status: AC
  Administered 2016-01-01: 2 mL via INTRADERMAL

## 2016-01-01 MED ORDER — LIDOCAINE HCL (PF) 1 % IJ SOLN
INTRAMUSCULAR | Status: AC
  Filled 2016-01-01: qty 5

## 2016-01-01 MED ORDER — INSULIN GLARGINE 100 UNIT/ML ~~LOC~~ SOLN
10.0000 [IU] | Freq: Every day | SUBCUTANEOUS | Status: DC
  Administered 2016-01-01: 10 [IU] via SUBCUTANEOUS
  Filled 2016-01-01 (×3): qty 0.1

## 2016-01-01 NOTE — Procedures (Signed)
Indication: meningitis  Risks of the procedure were dicussed with the patient family including post-LP headache, bleeding, infection, weakness/numbness of legs(radiculopathy), death.  The patient's proxy agreed and written consent was obtained.   The patient was prepped and draped, and using sterile technique a 20 gauge quinke spinal needle was inserted in the L3/4 space. Multiple attempts were made and was not successful. Patient will need to be done under fluoroscopy,  Etta Quill PA-C [facility] [phone]  01/01/2016, 12:04 PM

## 2016-01-01 NOTE — Progress Notes (Signed)
Subjective: Much improved today.   Exam: Filed Vitals:   01/01/16 0700 01/01/16 0800  BP: 100/54 107/55  Pulse:  70  Temp: 99.9 F (37.7 C) 99.7 F (37.6 C)  Resp: 25 25   Gen: In bed, NAD Resp: non-labored breathing, no acute distress Abd: soft, nt  Neuro: MS: somnolent but rousable with noxious stimuli. Able to follow simple commands and answer questions.  CN: EOMI, fixates and tracks, face symmetric.  Motor: MAEW Sensory:intact to LT  Impression: 82 yo F with severe sepsis and encephalopathy. I suspect that the movements seen in the ED may have been rigors rather than seizure, but difficult to be certain, especially with concern for eye deviation. Given encephalopathy, fever, probable seizures, I still think that an LP would be prudent to rule out meningitis, it was not able to be done safely yesterday, and sedation would likely have required intubation so she was treated empirically.   Her lack of meningismus does make this less likley, but still think that it would be prudent to investigate.   I would not favor long term AED unless further seizures occur, but would wait until her MS improves before we stop it.   Recommendations: 1) continue keppra 500mg  BID 2) LP for cells, glucose, protein, culture 3) will continue to follow.   Roland Rack, MD [facility] [phone]  If 7pm- 7am, please page neurology on call as listed in [place].

## 2016-01-01 NOTE — Progress Notes (Signed)
eLink Physician-Brief Progress Note Patient Name: Carrie Atkins DOB: 08-01-1933 MRN: [id number]   Date of Service  01/01/2016  HPI/Events of Note  Hypokalemia, on insulin gtt for hyperglycemia  eICU Interventions  Stop NS Increase D5 1/2NS with KCL to 125 cc/hr     Intervention Category Minor Interventions: Electrolytes abnormality - evaluation and management  Simonne Maffucci 01/01/2016, 2:00 AM

## 2016-01-01 NOTE — Progress Notes (Signed)
[facility] Progress Note Patient Name: Carrie Atkins DOB: 1934/01/19 MRN: [id number]   Date of Service  01/01/2016  HPI/Events of Note  Oliguria, had been hyperglycemic earlier  eICU Interventions  Saline bolus     Intervention Category Intermediate Interventions: Oliguria - evaluation and management  Simonne Maffucci 01/01/2016, 1:03 AM

## 2016-01-01 NOTE — Progress Notes (Signed)
Inpatient Diabetes Program Recommendations  AACE/ADA: New Consensus Statement on Inpatient Glycemic Control (2015)  Target Ranges:  Prepandial:   less than 140 mg/dL      Peak postprandial:   less than 180 mg/dL (1-2 hours)      Critically ill patients:  140 - 180 mg/dL   Lab Results  Component Value Date   GLUCAP 158* 01/01/2016   HGBA1C 12.4* 12/31/2015    Review of Glycemic Control  Diabetes history: DM 2 Outpatient Diabetes medications: Metformin 1000 and 500 mg and Amarl 1 mg  Inpatient Diabetes Program Recommendations:    Current orders for Inpatient glycemic control: Please use the ICU hyperglycemia protocol rather than the DKA order set at this time as it will better assist with control using those guidelines.  Thank you Rosita Kea, RN, MSN, CDE  Diabetes Inpatient Program Office: [phone] Pager: [phone] 8:00 am to 5:00 pm  I

## 2016-01-01 NOTE — Progress Notes (Signed)
PULMONARY / CRITICAL CARE MEDICINE   Name: Carrie Atkins MRN: [id number] DOB: 1934/03/20    ADMISSION DATE:  12/31/2015 CONSULTATION DATE:  7/18  REFERRING MD:  Reather Converse  CHIEF COMPLAINT:  Acute encephalopathy  HISTORY OF PRESENT ILLNESS:   This is a 82 year old female w/ multiple co-morbids including CLL and DM. Followed by neurology for severe diabetic neuropathy and gait disturbance. Presents to the ER after being found the am of 7/18 on floor naked, and incontinent of bowel and bladder. She would only verbalize 1 word phrases. Reportedly in route had violent shaking Of upper extremities. On eval found to be febrile, have mild lactic acid, and UA c/w UTI. She was treated w/ IV hydration, cultures were obtained, empiric abx were given. She again had significant upper extremity tonic/clonic activity. She was treated w/ keppra and IV ativan. PCCM was asked to see given her acute encephalopathy and concern about need for intubation  PAST MEDICAL HISTORY :  She  has a past medical history of Diabetes mellitus; Hypertension; Hyperlipidemia; Chest pain; Chronic lymphocytic leukemia ([place]); SOB (shortness of breath); CLL (chronic lymphocytic leukemia) ([place]) (06/05/2013); and Leukemia ([place]).  PAST SURGICAL HISTORY: She  has past surgical history that includes Total knee arthroplasty; Tonsillectomy; Cholecystectomy; US ECHOCARDIOGRAPHY (01/10/2007); US ECHOCARDIOGRAPHY (02/23/2006); and Cardiovascular stress test (05/29/2010).   SOCIAL HISTORY: She  reports that she has never smoked. She has never used smokeless tobacco. She reports that she does not drink alcohol or use illicit drugs.  REVIEW OF SYSTEMS:   Unable to obtain  SUBJECTIVE:  Remained febrile overnight Additionally had some tachypnea but no O2 requirement Overnight placed on insulin ggt for hyperglycemia Given fluids for oliguria  VITAL SIGNS: BP 107/55 mmHg  Pulse 70  Temp(Src) 99.7 F (37.6 C) (Oral)  Resp 25  Ht 5\' 6"   (1.676 m)  Wt 187 lb 6.3 oz (85 kg)  BMI 30.26 kg/m2  SpO2 100%  HEMODYNAMICS:    VENTILATOR SETTINGS:    INTAKE / OUTPUT: I/O last 3 completed shifts: In: 2009.3 [I.V.:1259.3; IV Piggyback:750] Out: 1620 [Urine:1620]  PHYSICAL EXAMINATION: General:Sleepy, slow to arouse, does not recognize daughter Neuro: Awakens to physical stimulation, will answer short word answers. No nuchal rigidity  HEENT: NCAT, MMM, no JVD, intermittent right gaze pref  Cardiovascular: RRR no MRG Lungs: Clear to auscultation bilaterally, normal work of breathing Abdomen: Soft, not tender, hypoactive bowel sounds Musculoskeletal: Normal bulk Skin: Warm and dry   LABS:  BMET  Recent Labs Lab 12/31/15 2034 01/01/16 0038 01/01/16 0605  NA 142 145 140  K 3.6 3.3* 3.1*  CL 113* 116* 114*  CO2 18* 22 18*  BUN 14 13 13   CREATININE 1.14* 1.10* 1.13*  GLUCOSE 357* 164* 292*    Electrolytes  Recent Labs Lab 12/31/15 1612 12/31/15 2034 01/01/16 0038 01/01/16 0605  CALCIUM  --  8.4* 8.6* 8.1*  MG 1.7  --   --  1.6*  PHOS 4.4  --   --  2.3*    CBC  Recent Labs Lab 12/31/15 1110 12/31/15 1612  WBC 17.5* 17.1*  HGB 13.5 12.1  HCT 39.2 36.5  PLT 216 185    Coag's  Recent Labs Lab 12/31/15 1612  APTT 26  INR 1.24    Sepsis Markers  Recent Labs Lab 12/31/15 1123 12/31/15 1448 12/31/15 1612 01/01/16 0605  LATICACIDVEN 3.22* 2.0*  --   --   PROCALCITON  --   --  <0.10 0.11    ABG  Recent Labs Lab 12/31/15 1442  PHART 7.272*  PCO2ART 27.8*  PO2ART 156.0*    Liver Enzymes  Recent Labs Lab 12/31/15 1448  AST 29  ALT 27  ALKPHOS 77  BILITOT 1.0  ALBUMIN 3.6    Cardiac Enzymes  Recent Labs Lab 12/31/15 1612 12/31/15 1932 01/01/16 0038  TROPONINI 0.03* 0.03* 0.04*    Glucose  Recent Labs Lab 12/31/15 2217 12/31/15 2345 01/01/16 0044 01/01/16 0147 01/01/16 0251 01/01/16 0340  GLUCAP 259* 192* 171* 173* 160* 158*    Imaging Ct  Head Wo Contrast  12/31/2015  CLINICAL DATA:  Septic with fever and new onset seizures. EXAM: CT HEAD WITHOUT CONTRAST TECHNIQUE: Contiguous axial images were obtained from the base of the skull through the vertex without intravenous contrast. COMPARISON:  None. FINDINGS: Ventricles, cisterns and other CSF spaces are within normal. There is no mass, mass effect, shift of midline structures or acute hemorrhage. There is mild chronic ischemic microvascular disease. No evidence of acute infarction. Bones and soft tissues are within normal. IMPRESSION: No acute intracranial findings. Chronic ischemic microvascular disease. Electronically Signed   By: Marin Olp M.D.   On: 12/31/2015 13:41   Mr Brain Wo Contrast  01/01/2016  CLINICAL DATA:  Initial evaluation for acute encephalopathy. EXAM: MRI HEAD WITHOUT CONTRAST TECHNIQUE: Multiplanar, multiecho pulse sequences of the brain and surrounding structures were obtained without intravenous contrast. COMPARISON:  Prior CT from 12/31/2015. FINDINGS: Cerebral volume within normal limits for patient age. Mild T2/FLAIR hyperintensity within the periventricular white matter most likely related to chronic small vessel ischemic disease, also within normal limits for patient age. No abnormal foci of restricted diffusion to suggest acute ischemia. Gray-white matter differentiation maintained. Major intracranial vascular flow voids are preserved. No acute or chronic intracranial hemorrhage. No areas of chronic infarction. No mass lesion, midline shift, or mass effect. No hydrocephalus. No extra-axial fluid collection. Major dural sinuses are grossly patent. Craniocervical junction within normal limits. Mild degenerative spondylolysis noted within the upper cervical spine without significant stenosis. Pituitary gland normal. No acute abnormality about the globes and orbits. Patient is status post lens extraction bilaterally. Scattered mucosal thickening throughout the paranasal  sinuses. No air-fluid level to suggest active sinus infection. No mastoid effusion. Inner ear structures normal. Bone marrow signal intensity within normal limits. No scalp soft tissue abnormality. IMPRESSION: Normal brain MRI for patient age. Electronically Signed   By: Jeannine Boga M.D.   On: 01/01/2016 05:38   Dg Chest Portable 1 View  12/31/2015  CLINICAL DATA:  Unresponsive. EXAM: PORTABLE CHEST 1 VIEW COMPARISON:  05/20/2010. FINDINGS: 1110 hours. Lung volumes are low. The cardio pericardial silhouette is enlarged. There is pulmonary vascular congestion without overt pulmonary edema. Interstitial pulmonary edema not excluded. No substantial pleural effusion. The visualized bony structures of the thorax are intact. Telemetry leads overlie the chest. IMPRESSION: Cardiomegaly with vascular congestion and possible interstitial pulmonary edema. Electronically Signed   By: Misty Stanley M.D.   On: 12/31/2015 11:35     STUDIES:  CT HEAD 7/18: negative for acute findings EEG 7/18>>> MRI 7/18>>>normal  CULTURES: UC 7/18>>> BCX2 7/18>>>  ANTIBIOTICS: vanc 7/18>>> Zosyn 7/18>>>  SIGNIFICANT EVENTS: 7/18: admit to ICU   LINES/TUBES: Urethral cath 7/18>> PIV L antecubital 7/18> PIV R wrist 7/18>  This is a 82 year old female who presents to ED after being found acutely encephalopathic w/ left sided weakness. She had what appeared to be seizure activity while in ED and in route to ER. She  is slow to arouse & requires physical stimulation. GCS 9. It is unclear why she is encephalopathic; ? Sepsis, ? Seizure ? CVA.Marland Kitchen Not clear that what was witnessed was actually fever but does have left sided weakness. We will admit to ICU, f/u eeg, pan-culture, place on empiric abx, hydrate and await pending chemistries as these could certainly be playing a major role in her treatment plan.   ASSESSMENT / PLAN:  PULMONARY A: At risk for airway compromise due to encephalopathy- stable on 2L [place] P:   Pulse ox Aspiration precautions  Short term intubation only per son's request.  CARDIOVASCULAR A:  SIRS/sepsis-->not in shock  Elevated Troponin (0.03>0.04) P:  Cont IVFs (D5 1/2 NS w/ KCl @ 125cc/hr) Trend troponins Vital signs per floor protocol  RENAL A:  Mild metabolic acidosis on admission Lactic acidosis- resolving Either d/t seizure or sepsis or both. Hypokalemia- repleting AKI- Improving P:  Trend lactic acid Strict I&O BMET q 4 while on insulin gtt Replace electrolytes as indicated  GASTROINTESTINAL A:  H/o Dysphagia per family P:  NPO Will need SLP when improved  HEMATOLOGIC A:  Leukocytosis  H/o CLL P:  Trend cbc [place] heparin   INFECTIOUS A:  UTI (UA showing many bacteria, + leuk and nitrites) Sepsis- resolving P:  Amp/rocephin/vanc. Day 1 Follow up urine and blood cx  ENDOCRINE A:  Hyperglycemia on admission- resolving. No AG Uncontrolled diabetes- Hgb A1C 12.4 on 7/18 P:  Hold home DM meds (Amaryl and Metformin) Continue insulin ggt per protocol Will transition to sub q insulin today  NEUROLOGIC A:  Acute Encephalopathy ->CT and MRI negative. CVA r/o, concern for infection. Has left sided weakness  R/o seizure vs rigors in setting to fever  H/o diabetic Neuropathy and chronic gait disturbance  P:  RASS goal: 0 EEG ordered stat and Neuro to follow Cont keppra LP today  FAMILY  - Updates: Daughter updated by Laurey Arrow. - Inter-disciplinary family meet or Palliative Care meeting due by: 7/25  Smitty Cords, MD [place], PGY-2  01/01/2016, 8:57 AM

## 2016-01-02 ENCOUNTER — Inpatient Hospital Stay (HOSPITAL_COMMUNITY): Payer: Medicare Other

## 2016-01-02 DIAGNOSIS — N3000 Acute cystitis without hematuria: Secondary | ICD-10-CM

## 2016-01-02 DIAGNOSIS — A4151 Sepsis due to Escherichia coli [E. coli]: Secondary | ICD-10-CM

## 2016-01-02 LAB — CBC
HEMATOCRIT: 36 % (ref 36.0–46.0)
Hemoglobin: 11.8 g/dL — ABNORMAL LOW (ref 12.0–15.0)
MCH: 31.4 pg (ref 26.0–34.0)
MCHC: 32.8 g/dL (ref 30.0–36.0)
MCV: 95.7 fL (ref 78.0–100.0)
PLATELETS: 123 10*3/uL — AB (ref 150–400)
RBC: 3.76 MIL/uL — ABNORMAL LOW (ref 3.87–5.11)
RDW: 12.8 % (ref 11.5–15.5)
WBC: 6.1 10*3/uL (ref 4.0–10.5)

## 2016-01-02 LAB — GLUCOSE, CAPILLARY
GLUCOSE-CAPILLARY: 183 mg/dL — AB (ref 65–99)
GLUCOSE-CAPILLARY: 189 mg/dL — AB (ref 65–99)
GLUCOSE-CAPILLARY: 231 mg/dL — AB (ref 65–99)
GLUCOSE-CAPILLARY: 249 mg/dL — AB (ref 65–99)
GLUCOSE-CAPILLARY: 260 mg/dL — AB (ref 65–99)
Glucose-Capillary: 268 mg/dL — ABNORMAL HIGH (ref 65–99)
Glucose-Capillary: 248 mg/dL — ABNORMAL HIGH (ref 65–99)
Glucose-Capillary: 248 mg/dL — ABNORMAL HIGH (ref 65–99)
Glucose-Capillary: 214 mg/dL — ABNORMAL HIGH (ref 65–99)

## 2016-01-02 LAB — BASIC METABOLIC PANEL
Anion gap: 3 — ABNORMAL LOW (ref 5–15)
Anion gap: 6 (ref 5–15)
BUN: 5 mg/dL — AB (ref 6–20)
CHLORIDE: 117 mmol/L — AB (ref 101–111)
CHLORIDE: 114 mmol/L — AB (ref 101–111)
CO2: 19 mmol/L — AB (ref 22–32)
CO2: 19 mmol/L — AB (ref 22–32)
CREATININE: 0.79 mg/dL (ref 0.44–1.00)
CREATININE: 0.85 mg/dL (ref 0.44–1.00)
Calcium: 8.1 mg/dL — ABNORMAL LOW (ref 8.9–10.3)
Calcium: 8.3 mg/dL — ABNORMAL LOW (ref 8.9–10.3)
GFR calc Af Amer: >60 mL/min (ref >60)
GFR calc Af Amer: >60 mL/min (ref >60)
GFR calc non Af Amer: >60 mL/min (ref >60)
GFR calc non Af Amer: >60 mL/min (ref >60)
Glucose, Bld: 269 mg/dL — ABNORMAL HIGH (ref 65–99)
Glucose, Bld: 225 mg/dL — ABNORMAL HIGH (ref 65–99)
Potassium: 4.3 mmol/L (ref 3.5–5.1)
Potassium: 3.8 mmol/L (ref 3.5–5.1)
Sodium: 139 mmol/L (ref 135–145)
Sodium: 139 mmol/L (ref 135–145)

## 2016-01-02 LAB — URINE CULTURE

## 2016-01-02 LAB — PROCALCITONIN: Procalcitonin: <0.1 ng/mL

## 2016-01-02 MED ORDER — SODIUM CHLORIDE 0.9 % IV BOLUS (SEPSIS)
1000.0000 mL | Freq: Once | INTRAVENOUS | Status: AC
  Administered 2016-01-02: 1000 mL via INTRAVENOUS

## 2016-01-02 MED ORDER — SODIUM CHLORIDE 0.9 % IV SOLN
INTRAVENOUS | Status: DC
  Administered 2016-01-02: 2 [IU]/h via INTRAVENOUS
  Filled 2016-01-02: qty 2.5

## 2016-01-02 MED ORDER — LIDOCAINE HCL (PF) 1 % IJ SOLN
10.0000 mL | Freq: Once | INTRAMUSCULAR | Status: AC
  Administered 2016-01-02: 10 mL via INTRADERMAL
  Filled 2016-01-02: qty 10

## 2016-01-02 MED ORDER — INSULIN ASPART 100 UNIT/ML ~~LOC~~ SOLN
0.0000 [IU] | Freq: Every day | SUBCUTANEOUS | Status: DC
  Administered 2016-01-02: 2 [IU] via SUBCUTANEOUS
  Administered 2016-01-03: 3 [IU] via SUBCUTANEOUS

## 2016-01-02 MED ORDER — DEXTROSE 5 % IV SOLN
1.0000 g | INTRAVENOUS | Status: DC
  Administered 2016-01-02 – 2016-01-03 (×2): 1 g via INTRAVENOUS
  Filled 2016-01-02 (×4): qty 10

## 2016-01-02 MED ORDER — INSULIN GLARGINE 100 UNIT/ML ~~LOC~~ SOLN
10.0000 [IU] | Freq: Every day | SUBCUTANEOUS | Status: DC
  Administered 2016-01-02 – 2016-01-04 (×3): 10 [IU] via SUBCUTANEOUS
  Filled 2016-01-02 (×3): qty 0.1

## 2016-01-02 MED ORDER — INSULIN ASPART 100 UNIT/ML ~~LOC~~ SOLN
0.0000 [IU] | Freq: Three times a day (TID) | SUBCUTANEOUS | Status: DC
  Administered 2016-01-02: 5 [IU] via SUBCUTANEOUS
  Administered 2016-01-02: 7 [IU] via SUBCUTANEOUS
  Administered 2016-01-03 (×3): 11 [IU] via SUBCUTANEOUS
  Administered 2016-01-04: 7 [IU] via SUBCUTANEOUS

## 2016-01-02 MED ORDER — SODIUM CHLORIDE 0.9 % IV SOLN: INTRAVENOUS | Status: DC

## 2016-01-02 MED ORDER — PNEUMOCOCCAL VAC POLYVALENT 25 MCG/0.5ML IJ INJ
0.5000 mL | INJECTION | INTRAMUSCULAR | Status: DC
  Filled 2016-01-02: qty 0.5

## 2016-01-02 NOTE — Progress Notes (Signed)
Called by RN about Pt having two consecutive blood sugars > 200. Per current insulin orders, if Pt has 2 consecutive blood sugars >200, need to implement ICU hyperglycemia protocol- phase 2 (restart insulin gtt).   -Order placed for ICU hyperglycemia protocol- phase 2 -Will continue to monitor blood sugars  Hyman Bible, MD PGY-2

## 2016-01-02 NOTE — Progress Notes (Signed)
Subjective: Continues to improve. States that she had been having headches prior to coming in.  Exam: Filed Vitals:   01/02/16 0500 01/02/16 0600  BP: 121/79 128/85  Pulse: 117 116  Temp: 98.4 F (36.9 C) 98.6 F (37 C)  Resp: 25 27   Gen: In bed, NAD Resp: non-labored breathing, no acute distress Abd: soft, nt  Neuro: MS: somnolent but rousable with noxious stimuli. Able to follow simple commands and answer questions.  CN: EOMI, fixates and tracks, face symmetric.  Motor: MAEW Sensory:intact to LT  Impression: 82 yo F with severe sepsis and encephalopathy. I suspect that the movements seen in the ED may have been rigors rather than seizure, but difficult to be certain, especially with concern for eye deviation.   Given encephalopathy, fever, probable seizures, I still think that an LP would be prudent to rule out meningitis, it was attempted at the bedside and unsuccessful yesterday. It will need to be done under fluoro.   Her lack of meningismus does make this less likley, but still think that it would be prudent to investigate.   I would not favor long term AED unless further seizures occur, but would wait until LP before stopping it.   Recommendations: 1) continue keppra 500mg  BID 2) LP for cells, glucose, protein, culture 3) will continue to follow.   Roland Rack, MD [facility] [phone]  If 7pm- 7am, please page neurology on call as listed in [place].

## 2016-01-02 NOTE — Progress Notes (Signed)
OT Cancellation Note  Patient Details Name: Carrie Atkins MRN: [id number] DOB: 03/27/1934   Cancelled Treatment:    Reason Eval/Treat Not Completed: Patient not medically ready - Pt with orders for strict bedrest.  Will initiate OT eval once activity orders advanced.  Lucille Passy, OTR/L [id number]  Lucille Passy M 01/02/2016, 2:47 PM

## 2016-01-02 NOTE — Progress Notes (Signed)
PULMONARY / CRITICAL CARE MEDICINE   Name: Carrie Atkins MRN: [id number] DOB: 04/21/1934    ADMISSION DATE:  12/31/2015 CONSULTATION DATE:  7/18  REFERRING MD:  Reather Converse  CHIEF COMPLAINT:  Acute encephalopathy  HISTORY OF PRESENT ILLNESS:   This is a 82 year old female w/ multiple co-morbids including CLL and DM. Followed by neurology for severe diabetic neuropathy and gait disturbance. Presents to the ER after being found the am of 7/18 on floor naked, and incontinent of bowel and bladder. She would only verbalize 1 word phrases. Reportedly in route had violent shaking Of upper extremities. On eval found to be febrile, have mild lactic acid, and UA c/w UTI. She was treated w/ IV hydration, cultures were obtained, empiric abx were given. She again had significant upper extremity tonic/clonic activity. She was treated w/ keppra and IV ativan. PCCM was asked to see given her acute encephalopathy and concern about need for intubation  PAST MEDICAL HISTORY :  She  has a past medical history of Diabetes mellitus; Hypertension; Hyperlipidemia; Chest pain; Chronic lymphocytic leukemia ([place]); SOB (shortness of breath); CLL (chronic lymphocytic leukemia) ([place]) (06/05/2013); and Leukemia ([place]).  PAST SURGICAL HISTORY: She  has past surgical history that includes Total knee arthroplasty; Tonsillectomy; Cholecystectomy; US ECHOCARDIOGRAPHY (01/10/2007); US ECHOCARDIOGRAPHY (02/23/2006); and Cardiovascular stress test (05/29/2010).   SOCIAL HISTORY: She  reports that she has never smoked. She has never used smokeless tobacco. She reports that she does not drink alcohol or use illicit drugs.  REVIEW OF SYSTEMS:   Unable to obtain  SUBJECTIVE:  Remained afebrile overnight Tachycardic overnight Additionally had some tachypnea but no O2 requirement Overnight placed back on insulin ggt for hyperglycemia Given fluids for oliguria  VITAL SIGNS: BP 128/85 mmHg  Pulse 116  Temp(Src) 98.6 F (37 C)  (Oral)  Resp 27  Ht 5\' 6"  (1.676 m)  Wt 192 lb 14.4 oz (87.5 kg)  BMI 31.15 kg/m2  SpO2 100%  HEMODYNAMICS:    VENTILATOR SETTINGS:    INTAKE / OUTPUT: I/O last 3 completed shifts: In: [id number] [I.V.:3960; IV Piggyback:1410] Out: 1168 [Urine:1168]  PHYSICAL EXAMINATION: General:awake and alert Neuro: Alert and oriented x 3, sensation grossly intact throughout, decreased strength throughout, left upper extremity weaker compared to the right HEENT: NCAT, MMM, no JVD Cardiovascular: RRR no MRG Lungs: Clear to auscultation bilaterally, normal work of breathing Abdomen: Soft, not tender, hypoactive bowel sounds Musculoskeletal: Normal bulk Skin: Warm and dry   LABS:  BMET  Recent Labs Lab 01/01/16 0605 01/01/16 0832 01/02/16 0519  NA 140 144 139  K 3.1* 3.2* 4.3  CL 114* 120* 117*  CO2 18* 21* 19*  BUN 13 12 5*  CREATININE 1.13* 0.98 0.79  GLUCOSE 292* 184* 269*    Electrolytes  Recent Labs Lab 12/31/15 1612  01/01/16 0605 01/01/16 0832 01/02/16 0519  CALCIUM  --   < > 8.1* 8.1* 8.1*  MG 1.7  --  1.6*  --   --   PHOS 4.4  --  2.3*  --   --   < > = values in this interval not displayed.  CBC  Recent Labs Lab 12/31/15 1110 12/31/15 1612 01/01/16 0832  WBC 17.5* 17.1* 10.0  HGB 13.5 12.1 11.2*  HCT 39.2 36.5 33.2*  PLT 216 185 147*    Coag's  Recent Labs Lab 12/31/15 1612  APTT 26  INR 1.24    Sepsis Markers  Recent Labs Lab 12/31/15 1123 12/31/15 1448 12/31/15 1612 01/01/16 0605 01/02/16  0519  LATICACIDVEN 3.22* 2.0*  --   --   --   PROCALCITON  --   --  <0.10 0.11 <0.10    ABG  Recent Labs Lab 12/31/15 1442  PHART 7.272*  PCO2ART 27.8*  PO2ART 156.0*    Liver Enzymes  Recent Labs Lab 12/31/15 1448  AST 29  ALT 27  ALKPHOS 77  BILITOT 1.0  ALBUMIN 3.6    Cardiac Enzymes  Recent Labs Lab 12/31/15 1612 12/31/15 1932 01/01/16 0038  TROPONINI 0.03* 0.03* 0.04*    Glucose  Recent Labs Lab  01/01/16 1550 01/01/16 1925 01/01/16 2351 01/02/16 0324 01/02/16 0533 01/02/16 0636  GLUCAP 107* 172* 249* 260* 268* 248*    Imaging No results found.   STUDIES:  CT HEAD 7/18: negative for acute findings EEG 7/18>>> MRI 7/18>>>normal  CULTURES: UC 7/18>>> BCX2 7/18>>>  ANTIBIOTICS: vanc 7/18>>> Zosyn 7/18>>>  SIGNIFICANT EVENTS: 7/18: admit to ICU  7/20: Encephalopathy resolved  LINES/TUBES: Urethral cath 7/18>> PIV L antecubital 7/18> PIV R wrist 7/18>   ASSESSMENT / PLAN:  PULMONARY A: At risk for airway compromise due to encephalopathy- stable on 2L  P:  Pulse ox Aspiration precautions  Short term intubation only per son's request.  CARDIOVASCULAR A:  SIRS/sepsis-->not in shock  Elevated Troponin (0.03>0.03>0.04) Tachycardia P:  KVO fluids as patient will be given full diet Vital signs per floor protocol Will give 1L NS bolus  RENAL A:  Mild metabolic acidosis on admission Lactic acidosis- resolved Either d/t seizure or sepsis or both. Hypokalemia- Resolved AKI- Resolved Oliguria P:  Strict I&O BMP q 12 Replace electrolytes as indicated  GASTROINTESTINAL A:  H/o Dysphagia per family P:  Regular diet, if patient not tolerating regular diet can go to soft diet  HEMATOLOGIC A:  Leukocytosis  H/o CLL P:  Trend cbc [place] heparin   INFECTIOUS A:  UTI- GNR likely E. Coli  Sepsis- resolving P:  Amp/rocephin/vanc. Day 2 Follow up urine and blood cx LP unable to be performed today, will continue meningitic dosing of CTX  ENDOCRINE A:  Hyperglycemia. No AG Uncontrolled diabetes- Hgb A1C 12.4 on 7/18 P:  Hold home DM meds (Amaryl and Metformin) Will transition to sub q insulin from drip- 10 U lantus daily Resistant SSI  NEUROLOGIC A:  Acute Encephalopathy ->CT and MRI negative. CVA r/o, concern for infection. Has left sided weakness  R/o seizure vs rigors in setting to fever  H/o diabetic  Neuropathy and chronic gait disturbance  P:  Neuro following, appreciate their assistance Cont keppra per neuro  FAMILY  - Updates: Daughter updated by Laurey Arrow. - Inter-disciplinary family meet or Palliative Care meeting due by: 7/25  Smitty Cords, MD [place], PGY-2  01/02/2016, 7:32 AM

## 2016-01-02 NOTE — Procedures (Signed)
Patient with fever and unsuccessful bedside lumbar puncture.  Fluoro guided lumbar puncture requested by radiology.  MRI Lumbar Spine from 11/07/14 shows significant multifactorial canal stenosis from L2-3 to L4-5.  Full procedure details dictated separately, but CSF puncture attempts at L2-3, L3-4, and L4-5 were unsuccessful in acquiring CSF.  Patient tolerated the procedure well without immediate complications and was discharged back to her room in good condition.

## 2016-01-03 DIAGNOSIS — N179 Acute kidney failure, unspecified: Secondary | ICD-10-CM

## 2016-01-03 DIAGNOSIS — B9620 Unspecified Escherichia coli [E. coli] as the cause of diseases classified elsewhere: Secondary | ICD-10-CM

## 2016-01-03 DIAGNOSIS — D72829 Elevated white blood cell count, unspecified: Secondary | ICD-10-CM | POA: Insufficient documentation

## 2016-01-03 DIAGNOSIS — N390 Urinary tract infection, site not specified: Secondary | ICD-10-CM

## 2016-01-03 LAB — CBC
HEMATOCRIT: 32.1 % — AB (ref 36.0–46.0)
HEMOGLOBIN: 10.7 g/dL — AB (ref 12.0–15.0)
MCH: 31.2 pg (ref 26.0–34.0)
MCHC: 33.3 g/dL (ref 30.0–36.0)
MCV: 93.6 fL (ref 78.0–100.0)
Platelets: 121 10*3/uL — ABNORMAL LOW (ref 150–400)
RBC: 3.43 MIL/uL — AB (ref 3.87–5.11)
RDW: 12.5 % (ref 11.5–15.5)
WBC: 5.5 10*3/uL (ref 4.0–10.5)

## 2016-01-03 LAB — BASIC METABOLIC PANEL
ANION GAP: 6 (ref 5–15)
ANION GAP: 8 (ref 5–15)
BUN: 5 mg/dL — ABNORMAL LOW (ref 6–20)
CHLORIDE: 113 mmol/L — AB (ref 101–111)
CHLORIDE: 111 mmol/L (ref 101–111)
CO2: 21 mmol/L — AB (ref 22–32)
CO2: 21 mmol/L — AB (ref 22–32)
CREATININE: 0.8 mg/dL (ref 0.44–1.00)
Calcium: 8.5 mg/dL — ABNORMAL LOW (ref 8.9–10.3)
Calcium: 9.1 mg/dL (ref 8.9–10.3)
Creatinine, Ser: 0.8 mg/dL (ref 0.44–1.00)
GFR calc Af Amer: >60 mL/min (ref >60)
GFR calc non Af Amer: >60 mL/min (ref >60)
GFR calc non Af Amer: >60 mL/min (ref >60)
GLUCOSE: 250 mg/dL — AB (ref 65–99)
Glucose, Bld: 246 mg/dL — ABNORMAL HIGH (ref 65–99)
POTASSIUM: 3.9 mmol/L (ref 3.5–5.1)
POTASSIUM: 4 mmol/L (ref 3.5–5.1)
SODIUM: 140 mmol/L (ref 135–145)
Sodium: 140 mmol/L (ref 135–145)

## 2016-01-03 LAB — GLUCOSE, CAPILLARY
GLUCOSE-CAPILLARY: 265 mg/dL — AB (ref 65–99)
GLUCOSE-CAPILLARY: 273 mg/dL — AB (ref 65–99)
Glucose-Capillary: 254 mg/dL — ABNORMAL HIGH (ref 65–99)
Glucose-Capillary: 277 mg/dL — ABNORMAL HIGH (ref 65–99)

## 2016-01-03 MED ORDER — INSULIN STARTER KIT- SYRINGES (ENGLISH)
1.0000 | Freq: Once | Status: AC
  Administered 2016-01-04: 1
  Filled 2016-01-03: qty 1

## 2016-01-03 NOTE — Evaluation (Signed)
Occupational Therapy Evaluation Patient Details Name: ANNALIE RUMFORD MRN: [id number] DOB: [birth year] Today's Date: 01/03/2016    History of Present Illness 82 yo F with severe sepsis and encephalopathy   Clinical Impression   Pt admitted with sepsis. Pt currently with functional limitations due to the deficits listed below (see OT Problem List).  Pt will benefit from skilled OT to increase their safety and independence with ADL and functional mobility for ADL to facilitate discharge to venue listed below.      Follow Up Recommendations  SNF    Equipment Recommendations       Recommendations for Other Services       Precautions / Restrictions Precautions Precautions: Fall      Mobility Bed Mobility Overal bed mobility: Needs Assistance Bed Mobility: Supine to Sit     Supine to sit: Min assist        Transfers Overall transfer level: Needs assistance Equipment used: 1 person hand held assist Transfers: Sit to/from Stand;Stand Pivot Transfers Sit to Stand: Min assist Stand pivot transfers: Min assist                 ADL Overall ADL's : Needs assistance/impaired Eating/Feeding: Set up;Sitting   Grooming: Set up;Sitting   Upper Body Bathing: Minimal assitance;Sitting   Lower Body Bathing: Moderate assistance;Sit to/from stand   Upper Body Dressing : Minimal assistance;Sitting   Lower Body Dressing: Moderate assistance   Toilet Transfer: Minimal assistance;BSC   Toileting- Clothing Manipulation and Hygiene: Minimal assistance;Sit to/from stand       Functional mobility during ADLs: Minimal assistance;Moderate assistance;Rolling walker General ADL Comments: pt agreeable to rehab- she has no A at home               Pertinent Vitals/Pain Pain Assessment: No/denies pain     Hand Dominance     Extremity/Trunk Assessment Upper Extremity Assessment Upper Extremity Assessment: Generalized weakness           Communication  Communication Communication: No difficulties   Cognition Arousal/Alertness: Awake/alert Behavior During Therapy: WFL for tasks assessed/performed Overall Cognitive Status: Within Functional Limits for tasks assessed                                Home Living Family/patient expects to be discharged to:: Private residence Living Arrangements: Alone Available Help at Discharge: Family Type of Home: Apartment Home Access: Ramped entrance     Home Layout: One level     Bathroom Shower/Tub: Walk-in shower         Home Equipment: Environmental consultant - 2 wheels;Shower seat          Prior Functioning/Environment Level of Independence: Independent             OT Diagnosis: Generalized weakness   OT Problem List: Decreased strength   OT Treatment/Interventions: Self-care/ADL training;Patient/family education;DME and/or AE instruction    OT Goals(Current goals can be found in the care plan section) Acute Rehab OT Goals Patient Stated Goal: be I OT Goal Formulation: With patient Time For Goal Achievement: 01/17/16 Potential to Achieve Goals: Good  OT Frequency: Min 2X/week   Barriers to D/C: Decreased caregiver support             End of Session Equipment Utilized During Treatment: Rolling walker Nurse Communication: Mobility status  Activity Tolerance: Patient tolerated treatment well Patient left: in bed;with call bell/phone within reach;with bed alarm set   Time: [id number] OT  Time Calculation (min): 15 min Charges:  OT General Charges $OT Visit: 1 Procedure OT Evaluation $OT Eval Moderate Complexity: 1 Procedure G-Codes:    Payton Mccallum D 01-07-16, 11:10 AM

## 2016-01-03 NOTE — Clinical Social Work Note (Signed)
Clinical Social Work Assessment  Patient Details  Name: Carrie Atkins MRN: [id number] Date of Birth: 1933/10/09  Date of referral:  01/03/16               Reason for consult:  Facility Placement                Permission sought to share information with:  Chartered certified accountant granted to share information::  Yes, Verbal Permission Granted  Name::     Conservator, museum/gallery::  SNFs  Relationship::  dtr  Contact Information:     Housing/Transportation Living arrangements for the past 2 months:  Single Family Home (apartment built onto [employer]) Source of Information:  Patient Patient Interpreter Needed:  None Criminal Activity/Legal Involvement Pertinent to Current Situation/Hospitalization:  No - Comment as needed Significant Relationships:  Adult Children Lives with:  Adult Children Do you feel safe going back to the place where you live?  No Need for family participation in patient care:  No (Coment)  Care giving concerns:  Pt with increased weakness- not enough support at home to return safely at this time- lives in apartment at dtrs but dtr cannot provide sufficient assistance   Facilities manager / plan:  CSW spoke with pt about PT recommendation for SNF- pt is agreeable to SNF placement and prefers [facility] which would be close to home.  Employment status:  Retired Nurse, adult PT Recommendations:  [place] / Referral to community resources:  [place]  Patient/Family's Response to care:  Agreeable to SNF placement if needed for rehab  Patient/Family's Understanding of and Emotional Response to Diagnosis, Current Treatment, and Prognosis:  No questions or concerns but hopeful to return home soon- values her continued independence.  Emotional Assessment Appearance:  Appears stated age Attitude/Demeanor/Rapport:    Affect (typically observed):  Appropriate, Accepting Orientation:   Oriented to Self, Oriented to Place, Oriented to  Time, Oriented to Situation Alcohol / Substance use:  Not Applicable Psych involvement (Current and /or in the community):  No (Comment)  Discharge Needs  Concerns to be addressed:  Care Coordination Readmission within the last 30 days:  No Current discharge risk:  Physical Impairment Barriers to Discharge:  Continued Medical Work up   Cranford Mon, LCSW 01/03/2016, 6:54 PM

## 2016-01-03 NOTE — NC FL2 (Signed)
[place] LEVEL OF CARE SCREENING TOOL     IDENTIFICATION  Patient Name: Carrie Atkins Birthdate: [birth year] Sex: female Admission Date (Current Location): 12/31/2015  [facility] and [place] Number:  [employer] and Address:  [facility], [place] [address], [place], [place] [address]      Provider Number: [id number]  Attending Physician Name and Address:  Robbie Lis, MD  Relative Name and Phone Number:       Current Level of Care: Hospital Recommended Level of Care: [place] Prior Approval Number:    Date Approved/Denied:   PASRR Number: [id number]  Discharge Plan: SNF    Current Diagnoses: Patient Active Problem List   Diagnosis Date Noted  . E. coli UTI   . Leukocytosis   . AKI (acute kidney injury) ([place])   . Acute cystitis without hematuria   . Sepsis ([place])   . Hyperglycemia   . Acute encephalopathy 12/31/2015  . DKA (diabetic ketoacidoses) ([place]) 12/31/2015  . Seizure ([place])   . Spinal stenosis, lumbar region, with neurogenic claudication 01/30/2015  . Cervical myelopathy ([place]) 01/30/2015  . Gait difficulty 10/24/2014  . Hyperreflexia 10/24/2014  . Weakness of both lower extremities 10/24/2014  . Diabetic polyneuropathy associated with type 2 diabetes mellitus ([place]) 10/24/2014  . Dyspnea 07/14/2013  . CLL (chronic lymphocytic leukemia) ([place]) 06/05/2013  . HTN (hypertension) 02/03/2011  . Hyperlipidemia 02/03/2011    Orientation RESPIRATION BLADDER Height & Weight     Self, Time, Situation, Place  Normal Continent Weight: 88.225 kg (194 lb 8 oz) Height:  5\' 3"  (160 cm)  BEHAVIORAL SYMPTOMS/MOOD NEUROLOGICAL BOWEL NUTRITION STATUS      Continent Diet  AMBULATORY STATUS COMMUNICATION OF NEEDS Skin   Limited Assist   Normal                       Personal Care Assistance Level of Assistance  Bathing, Dressing Bathing Assistance: Limited assistance   Dressing Assistance: Limited  assistance     Functional Limitations Info             SPECIAL CARE FACTORS FREQUENCY  PT (By licensed PT), OT (By licensed OT)     PT Frequency: 5/wk OT Frequency: 5/wk            Contractures      Additional Factors Info  Code Status, Allergies, Insulin Sliding Scale Code Status Info: Partial Allergies Info: Sulfonamide Derivatives, Hydrocodone, Sulfa Antibiotics, Other   Insulin Sliding Scale Info: 4/day       Current Medications (01/03/2016):  This is the current hospital active medication list Current Facility-Administered Medications  Medication Dose Route Frequency Provider Last Rate Last Dose  . 0.9 %  sodium chloride infusion   Intravenous Continuous Rigoberto Noel, MD   Stopped at 01/02/16 1151  . acetaminophen (TYLENOL) suppository 650 mg  650 mg Rectal Q4H PRN Elnora Morrison, MD   650 mg at 01/01/16 0402  . antiseptic oral rinse (CPC / CETYLPYRIDINIUM CHLORIDE 0.05%) solution 7 mL  7 mL Mouth Rinse BID Rush Farmer, MD   7 mL at 01/03/16 0819  . cefTRIAXone (ROCEPHIN) 1 g in dextrose 5 % 50 mL IVPB  1 g Intravenous Q24H Rigoberto Noel, MD   1 g at 01/02/16 2250  . heparin injection 5,000 Units  5,000 Units Subcutaneous Q8H Rush Farmer, MD   5,000 Units at 01/03/16 1323  . insulin aspart (novoLOG) injection  0-20 Units  0-20 Units Subcutaneous TID WC Carlyle Dolly, MD   11 Units at 01/03/16 1739  . insulin aspart (novoLOG) injection 0-5 Units  0-5 Units Subcutaneous QHS Carlyle Dolly, MD   2 Units at 01/02/16 2249  . insulin glargine (LANTUS) injection 10 Units  10 Units Subcutaneous Daily Carlyle Dolly, MD   10 Units at 01/03/16 0818  . pneumococcal 23 valent vaccine (PNU-IMMUNE) injection 0.5 mL  0.5 mL Intramuscular Tomorrow-1000 Rush Farmer, MD   0.5 mL at 01/03/16 0820     Discharge Medications: Please see discharge summary for a list of discharge medications.  Relevant Imaging Results:  Relevant Lab Results:   Additional  Information SS#: [id number]  Cranford Mon, [place]

## 2016-01-03 NOTE — Progress Notes (Signed)
Subjective: No further seizures. Alert and oriented. Doing well.   Exam: Filed Vitals:   01/03/16 0529 01/03/16 1303  BP: 147/80 164/79  Pulse: 76 86  Temp: 97.4 F (36.3 C) 97.9 F (36.6 C)  Resp: 20 20        Gen: In bed, NAD MS: alert and oriented.  CN: 2-12 intact Motor: MEAW Sensory: intact   Pertinent Labs/Diagnostics: Radiology attempted LP but also could not get due to stensosis.    Impression: 82 yo F with severe sepsis and encephalopathy. I suspect that the movements seen in the ED may have been rigors rather than seizure. At this time will D/C Keppra. No further recommendations per neurology. Primary team to continue treating UTI.   Etta Quill PA-C [facility] [phone]   01/03/2016, 1:55 PM

## 2016-01-03 NOTE — Progress Notes (Signed)
Inpatient Diabetes Program Recommendations  AACE/ADA: New Consensus Statement on Inpatient Glycemic Control (2015)  Target Ranges:  Prepandial:   less than 140 mg/dL      Peak postprandial:   less than 180 mg/dL (1-2 hours)      Critically ill patients:  140 - 180 mg/dL   Results for Carrie Atkins, Carrie Atkins (MRN [id number]) as of 01/03/2016 13:29  Ref. Range 01/03/2016 08:02 01/03/2016 11:49  Glucose-Capillary Latest Ref Range: 65-99 mg/dL 254 (H) 277 (H)   Results for Carrie Atkins, Carrie Atkins (MRN [id number]) as of 01/03/2016 13:29  Ref. Range 12/31/2015 14:54  Hemoglobin A1C Latest Ref Range: 4.8-5.6 % 12.4 (H)    Home DM Meds: Metformin 1000 mg AM/ 1500 mg PM  Current Orders: Lantus 10 units daily       Novolog Resistant Correction Scale/ SSI (0-20 units) TID AC + HS      -Spoke with patient about her current A1c of 12.4%.  Explained what an A1c is and what it measures.  Reminded patient that her goal A1c is 7% or less per ADA standards to prevent both acute and long-term complications.  Explained to patient the extreme importance of good glucose control at home.  Encouraged patient to check her CBGs at least bid at home (fasting and another check within the day) and to record all CBGs in a logbook for her PCP or Endocrinologist to review.  -Patient told me she is unsure of her discharge plans at this time.  Note OT recommending SNF at time of d/c.  -Patient also told me she does not want to start insulin at home.  Sees Dr. Arelia Sneddon? For primary care.  Wants to follow-up with Dr. Arelia Sneddon before she starts insulin at home.  Explained to patient that the Hospital MD may want to start her on insulin for home since her A1c was so elevated.  Will ask RNs caring for patient to attempt insulin education to see how she does giving insulin independently.   MD- Please consider the following in-hospital insulin adjustments:  1. Increase Lantus to 17 units daily (0.2 units/kg dosing)  2. Start Novolog Meal  Coverage: Novolog 3 units tid with meals (hold if pt eats <50% of meal)     --Will follow patient during hospitalization--  Wyn Quaker RN, MSN, CDE Diabetes Coordinator Inpatient Glycemic Control Team Team Pager: [phone] (8a-5p)

## 2016-01-03 NOTE — Progress Notes (Signed)
Physical Therapy Treatment Patient Details Name: Carrie Atkins MRN: [id number] DOB: 1934-05-03 Today's Date: 01/03/2016    History of Present Illness 82 yo F with severe sepsis and encephalopathy due to UTI.  Pt at one time thought to have had a seizure, but r/o by neurology.  Pt with significant PMHx of     PT Comments    Pt with mild confusion (unsure of baseline) and moderate risk of falls (even with RW use).  Pt would benefit from SNF level rehab before returning home where she does not always have 24/7 assist.  PT to follow acutely until d/c confirmed.     Follow Up Recommendations  Supervision/Assistance - 24 hour;SNF     Equipment Recommendations  None recommended by PT    Recommendations for Other Services   NA     Precautions / Restrictions Precautions Precautions: Fall Precaution Comments: h/o frequent falls    Mobility  Bed Mobility Overal bed mobility: Needs Assistance Bed Mobility: Supine to Sit     Supine to sit: Min assist;HOB elevated     General bed mobility comments: Min assist to support trunk during transition to sit.  HOB elelvated, pt using railing for leverage.  Transfers Overall transfer level: Needs assistance Equipment used: Rolling walker (2 wheeled) Transfers: Sit to/from Stand Sit to Stand: Mod assist         General transfer comment: Mod assist to support trunk during transition to stand from elevated bed.  Ambulation/Gait Ambulation/Gait assistance: Min assist;+2 safety/equipment Ambulation Distance (Feet): 200 Feet Assistive device: Rolling walker (2 wheeled) Gait Pattern/deviations: Step-through pattern;Shuffle;Trunk flexed Gait velocity: decreased   General Gait Details: pt with slow, shuffling gait pattern, verbal cues for pursed lip breathing, upright posture durin gait.  DOE by the end of gait 3/4, however, O2 sats (what accurate reading we got) were still in the 90s on RA.  HR stable.  Pt needed max verbal cues to avoid  obstacles in the hallway.  Chair to follow to encourage increased gait distance and safety. She did end up sitting in it before we got back to the room.           Balance Overall balance assessment: Needs assistance Sitting-balance support: No upper extremity supported;Feet supported Sitting balance-Leahy Scale: Good     Standing balance support: Bilateral upper extremity supported Standing balance-Leahy Scale: Poor                      Cognition Arousal/Alertness: Awake/alert Behavior During Therapy: WFL for tasks assessed/performed Overall Cognitive Status: No family/caregiver present to determine baseline cognitive functioning Area of Impairment: Memory     Memory: Decreased short-term memory         General Comments: No memory of events surrounding her admission to the hospital, also mild STM deficits which could possibly be baseline, difficulty with complex directions.            Pertinent Vitals/Pain Pain Assessment: No/denies pain    Home Living Family/patient expects to be discharged to:: Private residence Living Arrangements: Alone ("kind of"- 3 room addition on the back of her son's home) Available Help at Discharge: Family;Available PRN/intermittently Type of Home: Apartment Home Access: Ramped entrance   Home Layout: One level Home Equipment: Clinical cytogeneticist - 4 wheels      Prior Function Level of Independence: Independent with assistive device(s)      Comments: uses rollator   PT Goals (current goals can now be found in the care plan  section) Acute Rehab PT Goals Patient Stated Goal: to go to rehab to get stronger PT Goal Formulation: With patient Time For Goal Achievement: 01/17/16 Potential to Achieve Goals: Good    Frequency  Min 2X/week     End of Session Equipment Utilized During Treatment: Gait belt Activity Tolerance: Patient limited by lethargy Patient left: in chair;with call bell/phone within reach     Time:  1550-1619 PT Time Calculation (min) (ACUTE ONLY): 29 min  Charges:  $Gait Training: 8-22 mins            Carrie Atkins B. [place], [place], DPT [phone]   01/03/2016, 5:17 PM

## 2016-01-03 NOTE — Care Management Important Message (Signed)
Important Message  Patient Details  Name: Carrie Atkins MRN: [id number] Date of Birth: [birth year]   Medicare Important Message Given:  Yes    Loann Quill 01/03/2016, 10:23 AM

## 2016-01-03 NOTE — Progress Notes (Signed)
Patient ID: Carrie Atkins, female   DOB: 12-29-1933, 82 y.o.   MRN: [id number]  PROGRESS NOTE    Sarya Roath Blethen  [id number] DOB: 1933/08/30 DOA: 12/31/2015  PCP: [name]., MD   Brief Narrative:  82 year old  with multiple co-morbids including CLL and DM, followed by neurology for severe diabetic neuropathy and gait disturbance. Pt presented to [facility] ED after being found the am of 7/18 on floor naked and incontinent of bowel and bladder. Reportedly in route had violent shaking of upper extremities. Pt was found to be febrile, have mild lactic acidosis and UA c/w UTI. She was treated w/ IV hydration, cultures were obtained, empiric abx were given. She again had significant upper extremity tonic/clonic activity. She was treated w/ keppra and IV ativan. PCCM was asked to see given her acute encephalopathy and concern for airway protection and need for intubation.  LP was attempted twice and unable and started empirically on meningitis coverage   Assessment & Plan:  Acute Metabolic Encephalopathy  - CT and MRI negative. CVA r/o - Acute encephalopathy most likely due to sepsis from UTI, doubt meningitis, unable to obtain CSF - EEG on 7/18 showed generalized slowing, indicative of diffuse cerebral dysfunction. This is a nonspecific finding which may be due to toxic-metabolic, hypoxic or pharmacologic etiology,postictal state, or other diffuse physiologic dysfunction.  - Appreciate neuro following    At risk for airway compromise due to encephalopathy- - Stable at this point - Pt family if needed than okay for short term intubation only   Elevated Troponin  - Demand ischemia from sepsis - Mild elevation, overall stable  - No chest pain  Mild metabolic acidosis on admission / Lactic acidosis / Hypokalemia / AKI / Oliguria - Likely due to sepsis - Now resolved   Sepsis secondary to UTI secondary to E.Coli / Leukocytosis  - Was on ampicillin and rocephin but currently on Rocephin  only - Urine culture positive for e.coli - Blood cultures negative so far   Uncontrolled diabetes mellitus without complications without lon term insulin use - Hgb A1C 12.4 on 7/18 - Continue Lantus 10 units daily and SSI    DVT prophylaxis: Heparin subQ  Code Status: partial code  Family Communication: no family at the bedside this am Disposition Plan: to SNF once bed available    Consultants:   Radiology  Neurology   Procedures:   EEG 7/18 - generalized slowing, indicative of diffuse cerebral dysfunction. This is a nonspecific finding which may be due to toxic-metabolic, hypoxic or pharmacologic etiology,postictal state, or other diffuse physiologic dysfunction.   Antimicrobials:   Vanc 7/18 --> 7/21  Zosyn 7/18 --> 7/21  Ampicillin stopped 7/20  Rocephin currently    Subjective: No overnight events.   Objective: Filed Vitals:   01/02/16 1420 01/02/16 2125 01/03/16 0529 01/03/16 1303  BP: 144/78 145/87 147/80 164/79  Pulse: 83 82 76 86  Temp: 98.1 F (36.7 C) 98.3 F (36.8 C) 97.4 F (36.3 C) 97.9 F (36.6 C)  TempSrc: Oral  Oral Oral  Resp: 20 22 20 20   Height: 5\' 3"  (1.6 m)     Weight: 88.95 kg (196 lb 1.6 oz)  88.225 kg (194 lb 8 oz)   SpO2: 100% 100% 100% 100%    Intake/Output Summary (Last 24 hours) at 01/03/16 1328 Last data filed at 01/03/16 1158  Gross per 24 hour  Intake    635 ml  Output   1825 ml  Net  -1190 ml  Filed Weights   01/02/16 0341 01/02/16 1420 01/03/16 0529  Weight: 87.5 kg (192 lb 14.4 oz) 88.95 kg (196 lb 1.6 oz) 88.225 kg (194 lb 8 oz)    Examination:  General exam: Appears calm and comfortable  Respiratory system: Clear to auscultation. Respiratory effort normal. Cardiovascular system: S1 & S2 heard, Rate controlled  Gastrointestinal system: Abdomen is nondistended, soft and nontender. No organomegaly or masses felt. Normal bowel sounds heard. Central nervous system: No focal neurological  deficits. Extremities: Symmetric 5 x 5 power. Skin: No rashes, lesions or ulcers Psychiatry: Judgement and insight appear normal. Mood & affect appropriate.   Data Reviewed: I have personally reviewed following labs and imaging studies  CBC:  Recent Labs Lab 12/31/15 1110 12/31/15 1612 01/01/16 0832 01/02/16 0742 01/03/16 0645  WBC 17.5* 17.1* 10.0 6.1 5.5  NEUTROABS 11.8*  --   --   --   --   HGB 13.5 12.1 11.2* 11.8* 10.7*  HCT 39.2 36.5 33.2* 36.0 32.1*  MCV 93.3 94.6 94.1 95.7 93.6  PLT 216 185 147* 123* [id number]*   Basic Metabolic Panel:  Recent Labs Lab 12/31/15 1612  01/01/16 0605 01/01/16 0832 01/02/16 0519 01/02/16 1644 01/03/16 0645  NA  --   < > 140 144 139 139 140  K  --   < > 3.1* 3.2* 4.3 3.8 3.9  CL  --   < > 114* 120* 117* 114* 113*  CO2  --   < > 18* 21* 19* 19* 21*  GLUCOSE  --   < > 292* 184* 269* 225* 246*  BUN  --   < > 13 12 5* <5* 5*  CREATININE 1.36*  < > 1.13* 0.98 0.79 0.85 0.80  CALCIUM  --   < > 8.1* 8.1* 8.1* 8.3* 8.5*  MG 1.7  --  1.6*  --   --   --   --   PHOS 4.4  --  2.3*  --   --   --   --   < > = values in this interval not displayed. GFR: Estimated Creatinine Clearance: 59.1 mL/min (by C-G formula based on Cr of 0.8). Liver Function Tests:  Recent Labs Lab 12/31/15 1448  AST 29  ALT 27  ALKPHOS 77  BILITOT 1.0  PROT 6.7  ALBUMIN 3.6   No results for input(s): LIPASE, AMYLASE in the last 168 hours. No results for input(s): AMMONIA in the last 168 hours. Coagulation Profile:  Recent Labs Lab 12/31/15 1612  INR 1.24   Cardiac Enzymes:  Recent Labs Lab 12/31/15 1612 12/31/15 1932 01/01/16 0038  CKTOTAL 470*  --   --   TROPONINI 0.03* 0.03* 0.04*   BNP (last 3 results) No results for input(s): PROBNP in the last 8760 hours. HbA1C:  Recent Labs  12/31/15 1454  HGBA1C 12.4*   CBG:  Recent Labs Lab 01/02/16 1126 01/02/16 1634 01/02/16 2116 01/03/16 0802 01/03/16 1149  GLUCAP 183* 231* 214* 254* 277*    Lipid Profile: No results for input(s): CHOL, HDL, LDLCALC, TRIG, CHOLHDL, LDLDIRECT in the last 72 hours. Thyroid Function Tests: No results for input(s): TSH, T4TOTAL, FREET4, T3FREE, THYROIDAB in the last 72 hours. Anemia Panel: No results for input(s): VITAMINB12, FOLATE, FERRITIN, TIBC, IRON, RETICCTPCT in the last 72 hours. Urine analysis:    Component Value Date/Time   COLORURINE YELLOW 12/31/2015 1139   APPEARANCEUR CLOUDY* 12/31/2015 1139   LABSPEC 1.025 12/31/2015 1139   PHURINE 5.5 12/31/2015 1139   GLUCOSEU >1000*  12/31/2015 1139   [place] 12/31/2015 [place] 12/31/2015 1139   KETONESUR 40* 12/31/2015 1139   PROTEINUR NEGATIVE 12/31/2015 1139   NITRITE POSITIVE* 12/31/2015 1139   LEUKOCYTESUR MODERATE* 12/31/2015 1139   Sepsis Labs: @LABRCNTIP (procalcitonin:4,lacticidven:4)   Blood Culture (routine x 2)     Status: None (Preliminary result)   Collection Time: 12/31/15 11:10 AM  Result Value Ref Range Status   Specimen Description BLOOD LEFT ANTECUBITAL  Final   Culture NO GROWTH 3 DAYS  Final   Report Status PENDING  Incomplete  Urine culture     Status: Abnormal   Collection Time: 12/31/15 11:39 AM  Result Value Ref Range Status   Specimen Description URINE, CATHETERIZED  Final   Special Requests NONE  Final   Report Status 01/02/2016 FINAL  Final   Organism ID, Bacteria ESCHERICHIA COLI (A)  Final      Susceptibility   Escherichia coli - MIC*    AMPICILLIN <=2 SENSITIVE Sensitive     CEFAZOLIN <=4 SENSITIVE Sensitive     CEFTRIAXONE <=1 SENSITIVE Sensitive     CIPROFLOXACIN >=4 RESISTANT Resistant     GENTAMICIN <=1 SENSITIVE Sensitive     IMIPENEM <=0.25 SENSITIVE Sensitive     NITROFURANTOIN <=16 SENSITIVE Sensitive     TRIMETH/SULFA <=20 SENSITIVE Sensitive     AMPICILLIN/SULBACTAM <=2 SENSITIVE Sensitive     PIP/TAZO <=4 SENSITIVE Sensitive     * >=100,000 COLONIES/mL ESCHERICHIA COLI  Blood Culture (routine x 2)      Status: None (Preliminary result)   Collection Time: 12/31/15 12:09 PM  Result Value Ref Range Status   Specimen Description BLOOD RIGHT HAND  Final   Special Requests IN PEDIATRIC BOTTLE  1CC  Final   Culture NO GROWTH 3 DAYS  Final   Report Status PENDING  Incomplete  MRSA PCR Screening     Status: None   Collection Time: 12/31/15  3:56 PM  Result Value Ref Range Status   MRSA by PCR NEGATIVE NEGATIVE Final      Radiology Studies: Ct Head Wo Contrast 12/31/2015  No acute intracranial findings. Chronic ischemic microvascular disease.   Mr Brain Wo Contrast 01/01/2016  Normal brain MRI for patient age.   Dg Chest Portable 1 View 12/31/2015  Cardiomegaly with vascular congestion and possible interstitial pulmonary edema.   Dg Fluoro Guided Needle Plc Aspiration/injection Loc 01/02/2016  Unsuccessful multilevel attempt at lumbar puncture under fluoroscopic guidance. I discussed the study with Etta Quill PA-C from the Neurology service immediately after the procedure was completed.     Scheduled Meds: . antiseptic oral rinse  7 mL Mouth Rinse BID  . cefTRIAXone (ROCEPHIN)  IV  1 g Intravenous Q24H  . heparin  5,000 Units Subcutaneous Q8H  . insulin aspart  0-20 Units Subcutaneous TID WC  . insulin aspart  0-5 Units Subcutaneous QHS  . insulin glargine  10 Units Subcutaneous Daily  . levETIRAcetam  500 mg Intravenous Q12H  . pneumococcal 23 valent vaccine  0.5 mL Intramuscular Tomorrow-1000   Continuous Infusions: . sodium chloride Stopped (01/02/16 1151)     LOS: 3 days    Time spent: 25 minutes  Greater than 50% of the time spent on counseling and coordinating the care.   Leisa Lenz, MD [facility] Pager [phone]  If 7PM-7AM, please contact night-coverage [email or web address] Password [email or web address] 01/03/2016, 1:28 PM

## 2016-01-04 LAB — BASIC METABOLIC PANEL
Anion gap: 5 (ref 5–15)
CO2: 22 mmol/L (ref 22–32)
CREATININE: 0.72 mg/dL (ref 0.44–1.00)
Calcium: 8.9 mg/dL (ref 8.9–10.3)
Chloride: 113 mmol/L — ABNORMAL HIGH (ref 101–111)
Glucose, Bld: 238 mg/dL — ABNORMAL HIGH (ref 65–99)
POTASSIUM: 3.5 mmol/L (ref 3.5–5.1)
SODIUM: 140 mmol/L (ref 135–145)

## 2016-01-04 LAB — CBC
HCT: 34.9 % — ABNORMAL LOW (ref 36.0–46.0)
Hemoglobin: 11.2 g/dL — ABNORMAL LOW (ref 12.0–15.0)
MCH: 30.4 pg (ref 26.0–34.0)
MCHC: 32.1 g/dL (ref 30.0–36.0)
MCV: 94.8 fL (ref 78.0–100.0)
PLATELETS: 128 10*3/uL — AB (ref 150–400)
RBC: 3.68 MIL/uL — ABNORMAL LOW (ref 3.87–5.11)
RDW: 12.6 % (ref 11.5–15.5)
WBC: 5.7 10*3/uL (ref 4.0–10.5)

## 2016-01-04 LAB — GLUCOSE, CAPILLARY
GLUCOSE-CAPILLARY: 241 mg/dL — AB (ref 65–99)
Glucose-Capillary: 267 mg/dL — ABNORMAL HIGH (ref 65–99)

## 2016-01-04 MED ORDER — POTASSIUM CHLORIDE ER 10 MEQ PO TBCR: 10.0000 meq | EXTENDED_RELEASE_TABLET | Freq: Every day | ORAL | Status: DC

## 2016-01-04 MED ORDER — NITROFURANTOIN MONOHYD MACRO 100 MG PO CAPS: 100.0000 mg | ORAL_CAPSULE | Freq: Two times a day (BID) | ORAL | Status: DC

## 2016-01-04 MED ORDER — INSULIN ASPART 100 UNIT/ML ~~LOC~~ SOLN: 0.0000 [IU] | Freq: Three times a day (TID) | SUBCUTANEOUS | Status: DC

## 2016-01-04 MED ORDER — FUROSEMIDE 40 MG PO TABS: 40.0000 mg | ORAL_TABLET | Freq: Every day | ORAL | Status: DC

## 2016-01-04 MED ORDER — INSULIN GLARGINE 100 UNIT/ML ~~LOC~~ SOLN: 10.0000 [IU] | Freq: Every day | SUBCUTANEOUS | Status: DC

## 2016-01-04 NOTE — Clinical Social Work Placement (Signed)
   CLINICAL SOCIAL WORK PLACEMENT  NOTE  Date:  01/04/2016  Patient Details  Name: Carrie Atkins MRN: [id number] Date of Birth: [birth year]  Clinical Social Work is seeking post-discharge placement for this patient at the [place] level of care (*CSW will initial, date and re-position this form in  chart as items are completed):  Yes   Patient/family provided with [place] Work Department's list of facilities offering this level of care within the geographic area requested by the patient (or if unable, by the patient's family).  Yes   Patient/family informed of their freedom to choose among providers that offer the needed level of care, that participate in Medicare, Medicaid or managed care program needed by the patient, have an available bed and are willing to accept the patient.  Yes   Patient/family informed of [facility]'s ownership interest in [facility] and [facility], as well as of the fact that they are under no obligation to receive care at these facilities.  PASRR submitted to EDS on 01/03/16     PASRR number received on 01/03/16     Existing PASRR number confirmed on       FL2 transmitted to all facilities in geographic area requested by pt/family on 01/03/16     FL2 transmitted to all facilities within larger geographic area on       Patient informed that his/her managed care company has contracts with or will negotiate with certain facilities, including the following:        Yes   Patient/family informed of bed offers received.  Patient chooses bed at [facility], [place]     Physician recommends and patient chooses bed at      Patient to be transferred to [place], [place] on 01/04/16.  Patient to be transferred to facility by [facility]     Patient family notified on 01/04/16 of transfer.  Name of family member notified:  Melynda Keller, daughter     PHYSICIAN Please sign FL2     Additional Comment:     _______________________________________________ Benard Halsted, [place] 01/04/2016, 10:33 AM

## 2016-01-04 NOTE — Discharge Summary (Signed)
Physician Discharge Summary  Carrie Atkins [id number] DOB: [birth year] DOA: 12/31/2015  PCP: [name]., MD  Admit date: 12/31/2015 Discharge date: 01/04/2016  Recommendations for Outpatient Follow-up:  Continue macrobid for 5 days on discharge for E.COli UTI. Resume lasix at 40 mg a day dose on discharge with low dose potassium supplementation. May increase lasix if BP tolerates and if renal function remains WNL.  Discharge Diagnoses:  Active Problems:   Acute encephalopathy   DKA (diabetic ketoacidoses) (HCC)   Sepsis ([place])   Hyperglycemia   Acute cystitis without hematuria   E. coli UTI   Leukocytosis   AKI (acute kidney injury) ([place])    Discharge Condition: stable   Diet recommendation: as tolerated   History of present illness:  82 year old with multiple co-morbids including CLL and DM, followed by neurology for severe diabetic neuropathy and gait disturbance. Pt presented to [facility] ED after being found the am of 7/18 on floor naked and incontinent of bowel and bladder. Reportedly in route had violent shaking of upper extremities. Pt was found to be febrile, have mild lactic acidosis and UA c/w UTI. She was treated w/ IV hydration, cultures were obtained, empiric abx were given. She again had significant upper extremity tonic/clonic activity. She was treated w/ keppra and IV ativan. PCCM was asked to see given her acute encephalopathy and concern for airway protection and need for intubation.  LP was attempted twice and unable and started empirically on meningitis coverage. Since thought was that pt likely does not have meningitis she was then continued on rocephin for E.Coli UTI.   Hospital Course:    Assessment & Plan:  Acute Metabolic Encephalopathy  - CT and MRI negative. CVA r/o - Acute encephalopathy most likely due to sepsis from UTI, doubt meningitis, unable to obtain CSF - EEG on 7/18 showed generalized slowing, indicative of diffuse cerebral  dysfunction. This is a nonspecific finding which may be due to toxic-metabolic, hypoxic or pharmacologic etiology,postictal state, or other diffuse physiologic dysfunction.  - Seen by neuro in consultation   At risk for airway compromise due to encephalopathy- - Stable at this point - Pt family if needed than okay for short term intubation only   Elevated Troponin  - Demand ischemia from sepsis - Mild elevation, overall stable  - No chest pain  Mild metabolic acidosis on admission / Lactic acidosis / Hypokalemia / AKI / Oliguria - Likely due to sepsis - Now resolved   Sepsis secondary to UTI secondary to E.Coli / Leukocytosis  - Was on ampicillin and rocephin but currently on Rocephin only - Urine culture positive for e.coli - Change to Macrobid on discharge for 5 days  - Blood cultures negative so far   Uncontrolled diabetes mellitus without complications without lon term insulin use - Hgb A1C 12.4 on 7/18 - Continue Lantus 10 units daily and SSI    DVT prophylaxis: Heparin subQ in hospital  Code Status: partial code  Family Communication: no family at the bedside this am   Consultants:   Radiology  Neurology  Procedures:   EEG 7/18 - generalized slowing, indicative of diffuse cerebral dysfunction. This is a nonspecific finding which may be due to toxic-metabolic, hypoxic or pharmacologic etiology,postictal state, or other diffuse physiologic dysfunction.  Antimicrobials:   Vanc 7/18 --> 7/21  Zosyn 7/18 --> 7/21  Ampicillin stopped 7/20  Rocephin currently; change to macrobid on discharge    Signed:  Leisa Lenz, MD  [facility] 01/04/2016, 9:39 AM  Pager #:  [phone]  Time spent in minutes: more than 30 minutes   Discharge Exam: Filed Vitals:   01/04/16 0240 01/04/16 0608  BP: 147/89 158/69  Pulse: 75 64  Temp: 97.8 F (36.6 C) 98.6 F (37 C)  Resp: 17 17   Filed Vitals:   01/03/16 1303 01/03/16 2240 01/04/16 0240  01/04/16 0608  BP: 164/79 150/57 147/89 158/69  Pulse: 86 78 75 64  Temp: 97.9 F (36.6 C) 98.5 F (36.9 C) 97.8 F (36.6 C) 98.6 F (37 C)  TempSrc: Oral Oral Oral   Resp: 20 17 17 17   Height:      Weight:   88.6 kg (195 lb 5.2 oz)   SpO2: 100% 98% 97% 98%    General: Pt is alert, follows commands appropriately, not in acute distress Cardiovascular: Regular rate and rhythm, S1/S2 + Respiratory: Clear to auscultation bilaterally, no wheezing, no crackles, no rhonchi Abdominal: Soft, non tender, non distended, bowel sounds +, no guarding Extremities: no cyanosis, pulses palpable bilaterally DP and PT Neuro: Grossly nonfocal  Discharge Instructions  Discharge Instructions    Call MD for:  difficulty breathing, headache or visual disturbances    Complete by:  As directed      Call MD for:  persistant dizziness or light-headedness    Complete by:  As directed      Call MD for:  redness, tenderness, or signs of infection (pain, swelling, redness, odor or green/yellow discharge around incision site)    Complete by:  As directed      Call MD for:  severe uncontrolled pain    Complete by:  As directed      Diet - low sodium heart healthy    Complete by:  As directed      Discharge instructions    Complete by:  As directed   Continue macrobid for 5 days on discharge for E.COli UTI. Resume lasix at 40 mg a day dose on discharge with low dose potassium supplementation. May increase lasix if BP tolerates and if renal function remains WNL.     Increase activity slowly    Complete by:  As directed             Medication List    STOP taking these medications        amoxicillin 500 MG tablet  Commonly known as:  AMOXIL     metFORMIN 1000 MG tablet  Commonly known as:  GLUCOPHAGE      TAKE these medications        aspirin 81 MG tablet  Take 81 mg by mouth daily.     atorvastatin 40 MG tablet  Commonly known as:  LIPITOR  Take 40 mg by mouth every evening.      CALCIUM-MAGNESIUM-ZINC PO  Take by mouth 2 (two) times daily.     carvedilol 25 MG tablet  Commonly known as:  COREG  TAKE 1 TABLET BY MOUTH TWICE DAILY     ferrous sulfate 325 (65 FE) MG tablet  Take 325 mg by mouth daily with breakfast.     furosemide 40 MG tablet  Commonly known as:  LASIX  Take 1 tablet (40 mg total) by mouth daily.     insulin aspart 100 UNIT/ML injection  Commonly known as:  novoLOG  Inject 0-20 Units into the skin 3 (three) times daily with meals.     insulin glargine 100 UNIT/ML injection  Commonly known as:  LANTUS  Inject 0.1 mLs (10 Units total) into  the skin daily.     nitrofurantoin (macrocrystal-monohydrate) 100 MG capsule  Commonly known as:  MACROBID  Take 1 capsule (100 mg total) by mouth 2 (two) times daily.     potassium chloride 10 MEQ tablet  Commonly known as:  K-DUR  Take 1 tablet (10 mEq total) by mouth daily.     TYLENOL ARTHRITIS PAIN 650 MG CR tablet  Generic drug:  acetaminophen  Take 1,300 mg by mouth 2 (two) times daily.           Follow-up Information    Follow up with [facility] MD. Schedule an appointment as soon as possible for a visit in 1 week.   Specialty:  Family Medicine   Why:  Follow up appt after recent hospitalization   Contact information:   [place] [id number] [phone]        The results of significant diagnostics from this hospitalization (including imaging, microbiology, ancillary and laboratory) are listed below for reference.    Significant Diagnostic Studies: Ct Head Wo Contrast  12/31/2015  CLINICAL DATA:  Septic with fever and new onset seizures. EXAM: CT HEAD WITHOUT CONTRAST TECHNIQUE: Contiguous axial images were obtained from the base of the skull through the vertex without intravenous contrast. COMPARISON:  None. FINDINGS: Ventricles, cisterns and other CSF spaces are within normal. There is no mass, mass effect, shift of midline structures or acute  hemorrhage. There is mild chronic ischemic microvascular disease. No evidence of acute infarction. Bones and soft tissues are within normal. IMPRESSION: No acute intracranial findings. Chronic ischemic microvascular disease. Electronically Signed   By: Marin Olp M.D.   On: 12/31/2015 13:41   Mr Brain Wo Contrast  01/01/2016  CLINICAL DATA:  Initial evaluation for acute encephalopathy. EXAM: MRI HEAD WITHOUT CONTRAST TECHNIQUE: Multiplanar, multiecho pulse sequences of the brain and surrounding structures were obtained without intravenous contrast. COMPARISON:  Prior CT from 12/31/2015. FINDINGS: Cerebral volume within normal limits for patient age. Mild T2/FLAIR hyperintensity within the periventricular white matter most likely related to chronic small vessel ischemic disease, also within normal limits for patient age. No abnormal foci of restricted diffusion to suggest acute ischemia. Gray-white matter differentiation maintained. Major intracranial vascular flow voids are preserved. No acute or chronic intracranial hemorrhage. No areas of chronic infarction. No mass lesion, midline shift, or mass effect. No hydrocephalus. No extra-axial fluid collection. Major dural sinuses are grossly patent. Craniocervical junction within normal limits. Mild degenerative spondylolysis noted within the upper cervical spine without significant stenosis. Pituitary gland normal. No acute abnormality about the globes and orbits. Patient is status post lens extraction bilaterally. Scattered mucosal thickening throughout the paranasal sinuses. No air-fluid level to suggest active sinus infection. No mastoid effusion. Inner ear structures normal. Bone marrow signal intensity within normal limits. No scalp soft tissue abnormality. IMPRESSION: Normal brain MRI for patient age. Electronically Signed   By: Jeannine Boga M.D.   On: 01/01/2016 05:38   Dg Chest Portable 1 View  12/31/2015  CLINICAL DATA:  Unresponsive. EXAM:  PORTABLE CHEST 1 VIEW COMPARISON:  05/20/2010. FINDINGS: 1110 hours. Lung volumes are low. The cardio pericardial silhouette is enlarged. There is pulmonary vascular congestion without overt pulmonary edema. Interstitial pulmonary edema not excluded. No substantial pleural effusion. The visualized bony structures of the thorax are intact. Telemetry leads overlie the chest. IMPRESSION: Cardiomegaly with vascular congestion and possible interstitial pulmonary edema. Electronically Signed   By: Misty Stanley M.D.   On: 12/31/2015 11:35  Dg Fluoro Guided Needle Plc Aspiration/injection Loc  01/02/2016  CLINICAL DATA:  Fever and mental status changes. EXAM: DIAGNOSTIC LUMBAR PUNCTURE UNDER FLUOROSCOPIC GUIDANCE FLUOROSCOPY TIME:  Radiation Exposure Index (as provided by the fluoroscopic device): If the device does not provide the exposure index: Fluoroscopy Time (in minutes and seconds): 0 minutes and 54 seconds. Number of Acquired Images: PROCEDURE: Informed consent was obtained from the patient' daughter prior to the procedure, including potential complications of headache, allergy, heachache, bleeding and inability to obtain CSF. With the patient prone, the lower back was prepped with Betadine. 1% Lidocaine was used for local anesthesia. Previous MRI from 11/07/2014 showed significant multifactorial central canal stenosis from L2-3 down to L5-S1. As such, multiple levels were identified over the lower back using fluoroscopic guidance. Pre-procedure time-out was performed. Lumbar puncture was attempted multiple times at the L2-3, L3-4, and L4-5 levels using a 20 gauge needle with no return of CSF obtained. The patient tolerated procedure well without evidence for immediate complications. IMPRESSION: Unsuccessful multilevel attempt at lumbar puncture under fluoroscopic guidance. I discussed the study with Etta Quill PA-C from the Neurology service immediately after the procedure was completed. Electronically Signed    By: Misty Stanley M.D.   On: 01/02/2016 12:13    Microbiology: Recent Results (from the past 240 hour(s))  Blood Culture (routine x 2)     Status: None (Preliminary result)   Collection Time: 12/31/15 11:10 AM  Result Value Ref Range Status   Specimen Description BLOOD LEFT ANTECUBITAL  Final   Special Requests IN PEDIATRIC BOTTLE  5CC  Final   Culture NO GROWTH 3 DAYS  Final   Report Status PENDING  Incomplete  Urine culture     Status: Abnormal   Collection Time: 12/31/15 11:39 AM  Result Value Ref Range Status   Specimen Description URINE, CATHETERIZED  Final   Special Requests NONE  Final   Culture >=100,000 COLONIES/mL ESCHERICHIA COLI (A)  Final   Report Status 01/02/2016 FINAL  Final   Organism ID, Bacteria ESCHERICHIA COLI (A)  Final      Susceptibility   Escherichia coli - MIC*    AMPICILLIN <=2 SENSITIVE Sensitive     CEFAZOLIN <=4 SENSITIVE Sensitive     CEFTRIAXONE <=1 SENSITIVE Sensitive     CIPROFLOXACIN >=4 RESISTANT Resistant     GENTAMICIN <=1 SENSITIVE Sensitive     IMIPENEM <=0.25 SENSITIVE Sensitive     NITROFURANTOIN <=16 SENSITIVE Sensitive     TRIMETH/SULFA <=20 SENSITIVE Sensitive     AMPICILLIN/SULBACTAM <=2 SENSITIVE Sensitive     PIP/TAZO <=4 SENSITIVE Sensitive     * >=100,000 COLONIES/mL ESCHERICHIA COLI  Blood Culture (routine x 2)     Status: None (Preliminary result)   Collection Time: 12/31/15 12:09 PM  Result Value Ref Range Status   Specimen Description BLOOD RIGHT HAND  Final   Special Requests IN PEDIATRIC BOTTLE  1CC  Final   Culture NO GROWTH 3 DAYS  Final   Report Status PENDING  Incomplete  MRSA PCR Screening     Status: None   Collection Time: 12/31/15  3:56 PM  Result Value Ref Range Status   MRSA by PCR NEGATIVE NEGATIVE Final     Labs: Basic Metabolic Panel:  Recent Labs Lab 12/31/15 1612  01/01/16 0605  01/02/16 0519 01/02/16 1644 01/03/16 0645 01/03/16 1758 01/04/16 0430  NA  --   < > 140  < > 139 139 140 140  140  K  --   < >  3.1*  < > 4.3 3.8 3.9 4.0 3.5  CL  --   < > 114*  < > 117* 114* 113* 111 113*  CO2  --   < > 18*  < > 19* 19* 21* 21* 22  GLUCOSE  --   < > 292*  < > 269* 225* 246* 250* 238*  BUN  --   < > 13  < > 5* <5* 5* <5* <5*  CREATININE 1.36*  < > 1.13*  < > 0.79 0.85 0.80 0.80 0.72  CALCIUM  --   < > 8.1*  < > 8.1* 8.3* 8.5* 9.1 8.9  MG 1.7  --  1.6*  --   --   --   --   --   --   PHOS 4.4  --  2.3*  --   --   --   --   --   --   < > = values in this interval not displayed. Liver Function Tests:  Recent Labs Lab 12/31/15 1448  AST 29  ALT 27  ALKPHOS 77  BILITOT 1.0  PROT 6.7  ALBUMIN 3.6   No results for input(s): LIPASE, AMYLASE in the last 168 hours. No results for input(s): AMMONIA in the last 168 hours. CBC:  Recent Labs Lab 12/31/15 1110 12/31/15 1612 01/01/16 0832 01/02/16 0742 01/03/16 0645 01/04/16 0430  WBC 17.5* 17.1* 10.0 6.1 5.5 5.7  NEUTROABS 11.8*  --   --   --   --   --   HGB 13.5 12.1 11.2* 11.8* 10.7* 11.2*  HCT 39.2 36.5 33.2* 36.0 32.1* 34.9*  MCV 93.3 94.6 94.1 95.7 93.6 94.8  PLT 216 185 147* 123* 121* 128*   Cardiac Enzymes:  Recent Labs Lab 12/31/15 1612 12/31/15 1932 01/01/16 0038  CKTOTAL 470*  --   --   TROPONINI 0.03* 0.03* 0.04*   BNP: BNP (last 3 results) No results for input(s): BNP in the last 8760 hours.  ProBNP (last 3 results) No results for input(s): PROBNP in the last 8760 hours.  CBG:  Recent Labs Lab 01/03/16 1149 01/03/16 1723 01/03/16 2239 01/04/16 0105 01/04/16 0738  GLUCAP 277* 265* 273* 267* 241*

## 2016-01-04 NOTE — Progress Notes (Signed)
Patient will DC to: [facility] Anticipated DC date: 01/04/16 Family notified: Daughter over phone Transport by: Corey Harold   Per MD patient ready for DC to [facility]. RN, patient, patient's family, and facility notified of DC. RN given number for report. DC packet on chart. Ambulance transport requested for patient.   CSW signing off.  Cedric Fishman, [place] Social Worker [phone]

## 2016-01-04 NOTE — Discharge Instructions (Signed)

## 2016-01-05 LAB — CULTURE, BLOOD (ROUTINE X 2)
CULTURE: NO GROWTH
CULTURE: NO GROWTH

## 2016-01-06 LAB — GLUCOSE, CAPILLARY: Glucose-Capillary: 272 mg/dL — ABNORMAL HIGH (ref 65–99)

## 2016-02-04 ENCOUNTER — Other Ambulatory Visit: Payer: Self-pay | Admitting: Family Medicine

## 2016-02-04 ENCOUNTER — Ambulatory Visit
Admission: RE | Admit: 2016-02-04 | Discharge: 2016-02-04 | Disposition: A | Discharge status: Home / Self Care | Payer: Medicare Other | Source: Ambulatory Visit | Attending: Family Medicine | Admitting: Family Medicine

## 2016-02-04 DIAGNOSIS — R0902 Hypoxemia: Secondary | ICD-10-CM

## 2016-02-04 DIAGNOSIS — R05 Cough: Secondary | ICD-10-CM

## 2016-02-04 DIAGNOSIS — R059 Cough, unspecified: Secondary | ICD-10-CM

## 2016-03-19 ENCOUNTER — Other Ambulatory Visit: Payer: Self-pay | Admitting: Cardiovascular Disease

## 2016-09-17 ENCOUNTER — Inpatient Hospital Stay (HOSPITAL_COMMUNITY)
Admission: EM | Admit: 2016-09-17 | Discharge: 2016-09-23 | DRG: 871 | Disposition: A | Discharge status: Skilled Nursing Facility | Payer: Medicare Other | Attending: Internal Medicine | Admitting: Internal Medicine

## 2016-09-17 ENCOUNTER — Emergency Department (HOSPITAL_COMMUNITY): Payer: Medicare Other

## 2016-09-17 ENCOUNTER — Encounter (HOSPITAL_COMMUNITY): Payer: Self-pay

## 2016-09-17 DIAGNOSIS — I4891 Unspecified atrial fibrillation: Secondary | ICD-10-CM | POA: Diagnosis not present

## 2016-09-17 DIAGNOSIS — E1310 Other specified diabetes mellitus with ketoacidosis without coma: Secondary | ICD-10-CM | POA: Diagnosis not present

## 2016-09-17 DIAGNOSIS — E860 Dehydration: Secondary | ICD-10-CM | POA: Diagnosis not present

## 2016-09-17 DIAGNOSIS — I959 Hypotension, unspecified: Secondary | ICD-10-CM

## 2016-09-17 DIAGNOSIS — Z1612 Extended spectrum beta lactamase (ESBL) resistance: Secondary | ICD-10-CM | POA: Diagnosis present

## 2016-09-17 DIAGNOSIS — I471 Supraventricular tachycardia: Secondary | ICD-10-CM | POA: Diagnosis present

## 2016-09-17 DIAGNOSIS — M6282 Rhabdomyolysis: Secondary | ICD-10-CM | POA: Diagnosis present

## 2016-09-17 DIAGNOSIS — E876 Hypokalemia: Secondary | ICD-10-CM | POA: Diagnosis present

## 2016-09-17 DIAGNOSIS — R748 Abnormal levels of other serum enzymes: Secondary | ICD-10-CM

## 2016-09-17 DIAGNOSIS — G9340 Encephalopathy, unspecified: Secondary | ICD-10-CM

## 2016-09-17 DIAGNOSIS — Z882 Allergy status to sulfonamides status: Secondary | ICD-10-CM

## 2016-09-17 DIAGNOSIS — R296 Repeated falls: Secondary | ICD-10-CM | POA: Diagnosis present

## 2016-09-17 DIAGNOSIS — Z885 Allergy status to narcotic agent status: Secondary | ICD-10-CM

## 2016-09-17 DIAGNOSIS — Z9049 Acquired absence of other specified parts of digestive tract: Secondary | ICD-10-CM

## 2016-09-17 DIAGNOSIS — E1110 Type 2 diabetes mellitus with ketoacidosis without coma: Secondary | ICD-10-CM | POA: Diagnosis present

## 2016-09-17 DIAGNOSIS — D649 Anemia, unspecified: Secondary | ICD-10-CM | POA: Diagnosis present

## 2016-09-17 DIAGNOSIS — Z79899 Other long term (current) drug therapy: Secondary | ICD-10-CM

## 2016-09-17 DIAGNOSIS — E861 Hypovolemia: Secondary | ICD-10-CM | POA: Diagnosis present

## 2016-09-17 DIAGNOSIS — W19XXXA Unspecified fall, initial encounter: Secondary | ICD-10-CM

## 2016-09-17 DIAGNOSIS — Z801 Family history of malignant neoplasm of trachea, bronchus and lung: Secondary | ICD-10-CM

## 2016-09-17 DIAGNOSIS — Z825 Family history of asthma and other chronic lower respiratory diseases: Secondary | ICD-10-CM

## 2016-09-17 DIAGNOSIS — I10 Essential (primary) hypertension: Secondary | ICD-10-CM | POA: Diagnosis not present

## 2016-09-17 DIAGNOSIS — Z96659 Presence of unspecified artificial knee joint: Secondary | ICD-10-CM | POA: Diagnosis present

## 2016-09-17 DIAGNOSIS — R531 Weakness: Secondary | ICD-10-CM

## 2016-09-17 DIAGNOSIS — C9110 Chronic lymphocytic leukemia of B-cell type not having achieved remission: Secondary | ICD-10-CM | POA: Diagnosis present

## 2016-09-17 DIAGNOSIS — Z7982 Long term (current) use of aspirin: Secondary | ICD-10-CM

## 2016-09-17 DIAGNOSIS — N3001 Acute cystitis with hematuria: Secondary | ICD-10-CM

## 2016-09-17 DIAGNOSIS — A419 Sepsis, unspecified organism: Principal | ICD-10-CM

## 2016-09-17 DIAGNOSIS — I480 Paroxysmal atrial fibrillation: Secondary | ICD-10-CM

## 2016-09-17 DIAGNOSIS — N390 Urinary tract infection, site not specified: Secondary | ICD-10-CM | POA: Diagnosis present

## 2016-09-17 DIAGNOSIS — W010XXA Fall on same level from slipping, tripping and stumbling without subsequent striking against object, initial encounter: Secondary | ICD-10-CM | POA: Diagnosis present

## 2016-09-17 DIAGNOSIS — A499 Bacterial infection, unspecified: Secondary | ICD-10-CM

## 2016-09-17 DIAGNOSIS — Z9114 Patient's other noncompliance with medication regimen: Secondary | ICD-10-CM

## 2016-09-17 DIAGNOSIS — Z9181 History of falling: Secondary | ICD-10-CM

## 2016-09-17 DIAGNOSIS — E1142 Type 2 diabetes mellitus with diabetic polyneuropathy: Secondary | ICD-10-CM | POA: Diagnosis present

## 2016-09-17 DIAGNOSIS — Z823 Family history of stroke: Secondary | ICD-10-CM

## 2016-09-17 DIAGNOSIS — N179 Acute kidney failure, unspecified: Secondary | ICD-10-CM | POA: Diagnosis present

## 2016-09-17 DIAGNOSIS — R6521 Severe sepsis with septic shock: Secondary | ICD-10-CM | POA: Diagnosis present

## 2016-09-17 DIAGNOSIS — N3000 Acute cystitis without hematuria: Secondary | ICD-10-CM | POA: Diagnosis present

## 2016-09-17 LAB — COMPREHENSIVE METABOLIC PANEL
ALT: 21 U/L (ref 14–54)
AST: 29 U/L (ref 15–41)
Albumin: 4.2 g/dL (ref 3.5–5.0)
Alkaline Phosphatase: 95 U/L (ref 38–126)
Anion gap: 25 — ABNORMAL HIGH (ref 5–15)
BUN: 72 mg/dL — AB (ref 6–20)
CHLORIDE: 92 mmol/L — AB (ref 101–111)
CO2: 15 mmol/L — AB (ref 22–32)
CREATININE: 1.92 mg/dL — AB (ref 0.44–1.00)
Calcium: 9.8 mg/dL (ref 8.9–10.3)
GFR calc Af Amer: 27 mL/min — ABNORMAL LOW (ref >60)
GFR, EST NON AFRICAN AMERICAN: 23 mL/min — AB (ref >60)
GLUCOSE: 598 mg/dL — AB (ref 65–99)
Potassium: 4.1 mmol/L (ref 3.5–5.1)
Sodium: 132 mmol/L — ABNORMAL LOW (ref 135–145)
Total Bilirubin: 1.3 mg/dL — ABNORMAL HIGH (ref 0.3–1.2)
Total Protein: 8 g/dL (ref 6.5–8.1)

## 2016-09-17 LAB — CBC WITH DIFFERENTIAL/PLATELET
BASOS ABS: 0 10*3/uL (ref 0.0–0.1)
BASOS PCT: 0 %
EOS ABS: 0 10*3/uL (ref 0.0–0.7)
Eosinophils Relative: 0 %
HCT: 38.4 % (ref 36.0–46.0)
HEMOGLOBIN: 13.2 g/dL (ref 12.0–15.0)
LYMPHS ABS: 12.4 10*3/uL — AB (ref 0.7–4.0)
Lymphocytes Relative: 34 %
MCH: 31.7 pg (ref 26.0–34.0)
MCHC: 34.4 g/dL (ref 30.0–36.0)
MCV: 92.3 fL (ref 78.0–100.0)
MONO ABS: 1.5 10*3/uL — AB (ref 0.1–1.0)
Monocytes Relative: 4 %
NEUTROS ABS: 22.7 10*3/uL — AB (ref 1.7–7.7)
Neutrophils Relative %: 62 %
Platelets: 346 10*3/uL (ref 150–400)
RBC: 4.16 MIL/uL (ref 3.87–5.11)
RDW: 13 % (ref 11.5–15.5)
WBC: 36.6 10*3/uL — ABNORMAL HIGH (ref 4.0–10.5)

## 2016-09-17 LAB — GLUCOSE, CAPILLARY
GLUCOSE-CAPILLARY: 271 mg/dL — AB (ref 65–99)
GLUCOSE-CAPILLARY: 187 mg/dL — AB (ref 65–99)
GLUCOSE-CAPILLARY: 203 mg/dL — AB (ref 65–99)
Glucose-Capillary: 502 mg/dL (ref 65–99)
Glucose-Capillary: 387 mg/dL — ABNORMAL HIGH (ref 65–99)
Glucose-Capillary: 194 mg/dL — ABNORMAL HIGH (ref 65–99)
Glucose-Capillary: 200 mg/dL — ABNORMAL HIGH (ref 65–99)

## 2016-09-17 LAB — BASIC METABOLIC PANEL
Anion gap: 14 (ref 5–15)
BUN: 57 mg/dL — AB (ref 6–20)
CHLORIDE: 102 mmol/L (ref 101–111)
CO2: 21 mmol/L — AB (ref 22–32)
CREATININE: 1.43 mg/dL — AB (ref 0.44–1.00)
Calcium: 8.2 mg/dL — ABNORMAL LOW (ref 8.9–10.3)
GFR calc Af Amer: 39 mL/min — ABNORMAL LOW (ref >60)
GFR calc non Af Amer: 33 mL/min — ABNORMAL LOW (ref >60)
GLUCOSE: 199 mg/dL — AB (ref 65–99)
POTASSIUM: 3.8 mmol/L (ref 3.5–5.1)
SODIUM: 137 mmol/L (ref 135–145)

## 2016-09-17 LAB — URINALYSIS, ROUTINE W REFLEX MICROSCOPIC
BILIRUBIN URINE: NEGATIVE
Glucose, UA: >=500 mg/dL — AB
Ketones, ur: 5 mg/dL — AB
NITRITE: POSITIVE — AB
PH: 5 (ref 5.0–8.0)
Protein, ur: NEGATIVE mg/dL
SPECIFIC GRAVITY, URINE: 1.007 (ref 1.005–1.030)

## 2016-09-17 LAB — T4, FREE: FREE T4: 1.02 ng/dL (ref 0.61–1.12)

## 2016-09-17 LAB — RAPID URINE DRUG SCREEN, HOSP PERFORMED
Amphetamines: NOT DETECTED
BARBITURATES: NOT DETECTED
Benzodiazepines: NOT DETECTED
Cocaine: NOT DETECTED
OPIATES: NOT DETECTED
Tetrahydrocannabinol: NOT DETECTED

## 2016-09-17 LAB — PROTIME-INR
INR: 1.37
PROTHROMBIN TIME: 17 s — AB (ref 11.4–15.2)

## 2016-09-17 LAB — TSH: TSH: 1.285 u[IU]/mL (ref 0.350–4.500)

## 2016-09-17 LAB — MRSA PCR SCREENING: MRSA by PCR: NEGATIVE

## 2016-09-17 LAB — CBG MONITORING, ED
GLUCOSE-CAPILLARY: 531 mg/dL — AB (ref 65–99)
Glucose-Capillary: 489 mg/dL — ABNORMAL HIGH (ref 65–99)

## 2016-09-17 LAB — APTT: APTT: 26 s (ref 24–36)

## 2016-09-17 LAB — CK: Total CK: 500 U/L — ABNORMAL HIGH (ref 38–234)

## 2016-09-17 LAB — ETHANOL

## 2016-09-17 MED ORDER — FERROUS SULFATE 325 (65 FE) MG PO TABS
325.0000 mg | ORAL_TABLET | Freq: Every day | ORAL | Status: DC
  Administered 2016-09-18 – 2016-09-23 (×6): 325 mg via ORAL
  Filled 2016-09-17 (×6): qty 1

## 2016-09-17 MED ORDER — SODIUM CHLORIDE 0.9 % IV SOLN
INTRAVENOUS | Status: DC
  Administered 2016-09-17: 4.3 [IU]/h via INTRAVENOUS
  Filled 2016-09-17: qty 2.5

## 2016-09-17 MED ORDER — SODIUM CHLORIDE 0.9 % IV SOLN
INTRAVENOUS | Status: AC
  Administered 2016-09-17: 19:00:00 via INTRAVENOUS

## 2016-09-17 MED ORDER — SODIUM CHLORIDE 0.9% FLUSH
3.0000 mL | Freq: Two times a day (BID) | INTRAVENOUS | Status: DC
  Administered 2016-09-18 – 2016-09-23 (×8): 3 mL via INTRAVENOUS

## 2016-09-17 MED ORDER — ACETAMINOPHEN 325 MG PO TABS
650.0000 mg | ORAL_TABLET | Freq: Four times a day (QID) | ORAL | Status: DC | PRN
  Administered 2016-09-19 – 2016-09-22 (×2): 650 mg via ORAL
  Filled 2016-09-17 (×2): qty 2

## 2016-09-17 MED ORDER — PIOGLITAZONE HCL 15 MG PO TABS
15.0000 mg | ORAL_TABLET | Freq: Every day | ORAL | Status: DC
  Filled 2016-09-17: qty 1

## 2016-09-17 MED ORDER — SODIUM CHLORIDE 0.9 % IV BOLUS (SEPSIS)
1000.0000 mL | Freq: Once | INTRAVENOUS | Status: AC
  Administered 2016-09-17: 1000 mL via INTRAVENOUS

## 2016-09-17 MED ORDER — ACETAMINOPHEN 650 MG RE SUPP: 650.0000 mg | Freq: Four times a day (QID) | RECTAL | Status: DC | PRN

## 2016-09-17 MED ORDER — FAMOTIDINE 20 MG PO TABS
20.0000 mg | ORAL_TABLET | Freq: Two times a day (BID) | ORAL | Status: DC
  Administered 2016-09-17 – 2016-09-19 (×4): 20 mg via ORAL
  Filled 2016-09-17 (×5): qty 1

## 2016-09-17 MED ORDER — CARVEDILOL 12.5 MG PO TABS
25.0000 mg | ORAL_TABLET | Freq: Two times a day (BID) | ORAL | Status: DC
  Administered 2016-09-17 – 2016-09-18 (×2): 25 mg via ORAL
  Filled 2016-09-17 (×2): qty 2

## 2016-09-17 MED ORDER — ENOXAPARIN SODIUM 80 MG/0.8ML ~~LOC~~ SOLN: 1.0000 mg/kg | Freq: Two times a day (BID) | SUBCUTANEOUS | Status: DC

## 2016-09-17 MED ORDER — SODIUM CHLORIDE 0.9 % IV SOLN
INTRAVENOUS | Status: DC
Start: 2016-09-17 — End: 2016-09-20
  Administered 2016-09-17 – 2016-09-19 (×2): via INTRAVENOUS
  Administered 2016-09-19: 1000 mL via INTRAVENOUS
  Administered 2016-09-19: 06:00:00 via INTRAVENOUS

## 2016-09-17 MED ORDER — DEXTROSE 5 % IV SOLN
1.0000 g | Freq: Once | INTRAVENOUS | Status: AC
  Administered 2016-09-17: 1 g via INTRAVENOUS
  Filled 2016-09-17: qty 10

## 2016-09-17 MED ORDER — SODIUM CHLORIDE 0.9 % IV SOLN
INTRAVENOUS | Status: DC
  Administered 2016-09-17: 100 mL/h via INTRAVENOUS

## 2016-09-17 MED ORDER — ENOXAPARIN SODIUM 30 MG/0.3ML ~~LOC~~ SOLN: 30.0000 mg | SUBCUTANEOUS | Status: DC

## 2016-09-17 MED ORDER — SENNA 8.6 MG PO TABS
1.0000 | ORAL_TABLET | Freq: Two times a day (BID) | ORAL | Status: DC
  Administered 2016-09-17 – 2016-09-23 (×7): 8.6 mg via ORAL
  Filled 2016-09-17 (×7): qty 1

## 2016-09-17 MED ORDER — GLIMEPIRIDE 4 MG PO TABS
8.0000 mg | ORAL_TABLET | Freq: Every day | ORAL | Status: DC
  Filled 2016-09-17: qty 2

## 2016-09-17 MED ORDER — ENOXAPARIN SODIUM 80 MG/0.8ML ~~LOC~~ SOLN
1.0000 mg/kg | SUBCUTANEOUS | Status: DC
  Administered 2016-09-17 – 2016-09-19 (×3): 65 mg via SUBCUTANEOUS
  Filled 2016-09-17 (×3): qty 0.8

## 2016-09-17 MED ORDER — METOPROLOL TARTRATE 5 MG/5ML IV SOLN
5.0000 mg | Freq: Once | INTRAVENOUS | Status: AC
  Administered 2016-09-17: 5 mg via INTRAVENOUS
  Filled 2016-09-17: qty 5

## 2016-09-17 MED ORDER — DEXTROSE-NACL 5-0.45 % IV SOLN: INTRAVENOUS | Status: DC

## 2016-09-17 MED ORDER — POTASSIUM CHLORIDE 10 MEQ/100ML IV SOLN
10.0000 meq | INTRAVENOUS | Status: AC
  Administered 2016-09-17 (×3): 10 meq via INTRAVENOUS
  Filled 2016-09-17 (×3): qty 100

## 2016-09-17 MED ORDER — ONDANSETRON HCL 4 MG/2ML IJ SOLN: 4.0000 mg | Freq: Four times a day (QID) | INTRAMUSCULAR | Status: DC | PRN

## 2016-09-17 MED ORDER — IRBESARTAN 300 MG PO TABS
300.0000 mg | ORAL_TABLET | Freq: Every day | ORAL | Status: DC
  Administered 2016-09-18: 300 mg via ORAL
  Filled 2016-09-17: qty 1

## 2016-09-17 MED ORDER — SODIUM CHLORIDE 0.9 % IV SOLN
INTRAVENOUS | Status: DC
  Filled 2016-09-17: qty 2.5

## 2016-09-17 MED ORDER — DEXTROSE 5 % IV SOLN
1.0000 g | INTRAVENOUS | Status: DC
  Filled 2016-09-17: qty 10

## 2016-09-17 MED ORDER — SODIUM CHLORIDE 0.9 % IV SOLN: 30.0000 meq | Freq: Once | INTRAVENOUS | Status: DC

## 2016-09-17 MED ORDER — ASPIRIN EC 81 MG PO TBEC
81.0000 mg | DELAYED_RELEASE_TABLET | Freq: Every day | ORAL | Status: DC
  Administered 2016-09-18 – 2016-09-23 (×6): 81 mg via ORAL
  Filled 2016-09-17 (×6): qty 1

## 2016-09-17 MED ORDER — ONDANSETRON HCL 4 MG PO TABS: 4.0000 mg | ORAL_TABLET | Freq: Four times a day (QID) | ORAL | Status: DC | PRN

## 2016-09-17 NOTE — ED Notes (Signed)
Bed: WA04 Expected date:  Expected time:  Means of arrival:  Comments: EMS- hyperglycemia  

## 2016-09-17 NOTE — ED Notes (Signed)
Pt returning from XR.

## 2016-09-17 NOTE — ED Notes (Signed)
Patient transported to X-ray 

## 2016-09-17 NOTE — ED Notes (Signed)
Attempts x 3 by Probation officer and charge nurse to start IV and obatain labs with no success.

## 2016-09-17 NOTE — ED Provider Notes (Signed)
Asked by primary team to assist with IV access  Angiocath insertion Performed by: Sherwood Gambler T  Consent: Verbal consent obtained. Risks and benefits: risks, benefits and alternatives were discussed Time out: Immediately prior to procedure a "time out" was called to verify the correct patient, procedure, equipment, support staff and site/side marked as required.  Preparation: Patient was prepped and draped in the usual sterile fashion.  Vein Location: right basilic  Ultrasound Guided  Gauge: 20  Normal blood return and flush without difficulty Patient tolerance: Patient tolerated the procedure well with no immediate complications.      Sherwood Gambler, MD 09/17/16 [phone]

## 2016-09-17 NOTE — ED Triage Notes (Signed)
Patient states she fell at 0100 this AM and slept on the floor until her daughter came at 0800. Patient states her walker tilted while walking to the bathroom and then she fell. Patient denies LOC and /or injury.

## 2016-09-17 NOTE — Care Management Note (Signed)
Case Management Note  Patient Details  Name: KORRINE SICARD MRN: [id number] Date of Birth: [birth year]  Subjective/Objective:                  Hyperglycemia, fall  Action/Plan: ED CM spoke with the patient and her daughter at the bedside. The patient lives with her daughter. Her daughter is the caregiver for the patient and her own husband who is ill. Patient's daughter states she needs assistance at home. Patient's daughter requested home health while in the ED. CM provided her with a list of home health agencies and informed her the CM for her mother's unit will arrange services after they have been ordered by the physician. She reports the patient has a walker and a shower chair. She had been free from falls since August until the fall reported this admission. CM will continue to follow for discharge needs.   Expected Discharge Date:   (unknown)               Expected Discharge Plan:     In-House Referral:     Discharge planning Services  CM Consult  Post Acute Care Choice:  Home Health Choice offered to:  Adult Children, Patient  DME Arranged:    DME Agency:     HH Arranged:    HH Agency:     Status of Service:  In process, will continue to follow  If discussed at Long Length of Stay Meetings, dates discussed:    Additional Comments:  Apolonio Schneiders, RN 09/17/2016, 6:13 PM

## 2016-09-17 NOTE — H&P (Addendum)
History and Physical    Carrie Atkins SUP:[id number] DOB: 1933/07/23 DOA: 09/17/2016    PCP: [name]., MD  Patient coming from: home  Chief Complaint: PCP asked her to come in for sugar > 700 yesterday.   HPI: Carrie Atkins is a 83 y.o. female with medical history significant or CLL, diabetes mellitus with neuropathy, hypertension and mild LV outflow tract obstruction follows with [name] resents with elevated blood sugar as mentioned above. The patient's daughter gives most of the history. The patient lives in an apartment which is an addition on the daughter's home. She states that she noted that her mother had not taken pills since Sunday when she looked at her pill box. She took her to see her PCP yesterday for this reason and gave her Actos and Amaryl last night. The patient fell on the floor last night and was found there by her daughter today.  She was called today by the PCP who told her to go to the ER for elevated blood sugar. The patient cannot explain why she stopped taking her pills. The daughter also states that she thought her mother was eating her food but later noted the food in the trash. The patient states that she has not had much of an appetite lately.   ED Course:  Hypertensive with blood pressure of 168/63, tachycardic with heart rate at one point in 130s in the ER EKG shows rate controlled A-fib  blood glucose 598, sodium 132, chloride 92, CK total 500, WBC count 36.6  urine positive for nitrites and rare bacteria   In SDU: HR went up to 130s- given 5 mg IV Lopressor with improvement down to 70-80s  Review of Systems:  All other systems reviewed and apart from HPI, are negative.  Past Medical History:  Diagnosis Date  . Chest pain   . Chronic lymphocytic leukemia ([place])   . CLL (chronic lymphocytic leukemia) ([place]) 06/05/2013  . Diabetes mellitus   . Hyperlipidemia   . Hypertension   . Leukemia ([place])   . SOB (shortness of breath)     Past  Surgical History:  Procedure Laterality Date  . CARDIOVASCULAR STRESS TEST  05/29/2010   EF 83%  . CHOLECYSTECTOMY    . TONSILLECTOMY    . TOTAL KNEE ARTHROPLASTY    . US ECHOCARDIOGRAPHY  01/10/2007   EF 55-60%  . US ECHOCARDIOGRAPHY  02/23/2006    EF 55-60%    Social History:   reports that she has never smoked. She has never used smokeless tobacco. She reports that she does not drink alcohol or use drugs.  Walks with a walker  Allergies  Allergen Reactions  . Sulfonamide Derivatives Nausea And Vomiting  . Hydrocodone Nausea And Vomiting  . Sulfa Antibiotics Nausea And Vomiting    Family History  Problem Relation Age of Onset  . Stroke Mother   . Lung cancer Father   . COPD Brother      Prior to Admission medications   Medication Sig Start Date End Date Taking? Authorizing Provider  acetaminophen (TYLENOL ARTHRITIS PAIN) 650 MG CR tablet Take 1,300 mg by mouth 2 (two) times daily.     Yes Historical Provider, MD  amoxicillin (AMOXIL) 500 MG capsule Take 2,000 mg by mouth once as needed (before dental appointments).   Yes Historical Provider, MD  aspirin EC 81 MG tablet Take 81 mg by mouth daily.   Yes Historical Provider, MD  CALCIUM-MAGNESIUM-ZINC PO Take 2 tablets by mouth 2 (  two) times daily.    Yes Historical Provider, MD  carvedilol (COREG) 25 MG tablet TAKE 1 TABLET BY MOUTH TWICE DAILY 03/20/16  Yes Thayer Headings, MD  ferrous sulfate 325 (65 FE) MG tablet Take 325 mg by mouth daily with breakfast.     Yes Historical Provider, MD  furosemide (LASIX) 40 MG tablet Take 1 tablet (40 mg total) by mouth daily. 01/04/16  Yes Robbie Lis, MD  glimepiride (AMARYL) 4 MG tablet Take 8 mg by mouth daily with breakfast.   Yes Historical Provider, MD  pioglitazone (ACTOS) 30 MG tablet Take 15 mg by mouth daily.   Yes Historical Provider, MD  ranitidine (ZANTAC) 150 MG tablet Take 150 mg by mouth 2 (two) times daily as needed for heartburn.   Yes Historical Provider, MD    valsartan (DIOVAN) 320 MG tablet Take 320 mg by mouth daily.   Yes Historical Provider, MD    Physical Exam: Vitals:   09/17/16 1539 09/17/16 1630 09/17/16 1715 09/17/16 1755  BP: 137/86 (!) 150/86 (!) 175/85 (!) 185/139  Pulse: (!) 133 (!) 139 97 80  Resp: 18 16 14 19   Temp: 97.8 F (36.6 C)     TempSrc: Oral     SpO2: 100% 100% 99% 100%  Weight:      Height:          Constitutional: NAD, calm, comfortable Eyes: PERTLA, lids and conjunctivae normal ENMT: Mucous membranes are moist. Posterior pharynx clear of any exudate or lesions. Normal dentition.  Neck: normal, supple, no masses, no thyromegaly Respiratory: clear to auscultation bilaterally, no wheezing, no crackles. Normal respiratory effort. No accessory muscle use.  Cardiovascular: S1 & S2 heard, regular rate and rhythm, no murmurs / rubs / gallops. No extremity edema. 2+ pedal pulses. No carotid bruits.  Abdomen: No distension, no tenderness, no masses palpated. No hepatosplenomegaly. Bowel sounds normal.  Musculoskeletal: no clubbing / cyanosis. No joint deformity upper and lower extremities. Good ROM, no contractures. Normal muscle tone.  Skin: no rashes, lesions, ulcers. No induration Neurologic: CN 2-12 grossly intact. Sensation intact, DTR normal. Strength 5/5 in all 4 limbs.  Psychiatric: Normal judgment and insight. Alert and oriented x 3. Normal mood.     Labs on Admission: I have personally reviewed following labs and imaging studies  CBC:  Recent Labs Lab 09/17/16 1222  WBC 36.6*  NEUTROABS 22.7*  HGB 13.2  HCT 38.4  MCV 92.3  PLT 176   Basic Metabolic Panel:  Recent Labs Lab 09/17/16 1222  NA 132*  K 4.1  CL 92*  CO2 15*  GLUCOSE 598*  BUN 72*  CREATININE 1.92*  CALCIUM 9.8   GFR: Estimated Creatinine Clearance: 20 mL/min (A) (by C-G formula based on SCr of 1.92 mg/dL (H)). Liver Function Tests:  Recent Labs Lab 09/17/16 1222  AST 29  ALT 21  ALKPHOS 95  BILITOT 1.3*  PROT  8.0  ALBUMIN 4.2   No results for input(s): LIPASE, AMYLASE in the last 168 hours. No results for input(s): AMMONIA in the last 168 hours. Coagulation Profile:  Recent Labs Lab 09/17/16 1223  INR 1.37   Cardiac Enzymes:  Recent Labs Lab 09/17/16 1329  CKTOTAL 500*   BNP (last 3 results) No results for input(s): PROBNP in the last 8760 hours. HbA1C: No results for input(s): HGBA1C in the last 72 hours. CBG:  Recent Labs Lab 09/17/16 1053 09/17/16 1538 09/17/16 1717  GLUCAP 531* 489* 502*   Lipid Profile: No results  for input(s): CHOL, HDL, LDLCALC, TRIG, CHOLHDL, LDLDIRECT in the last 72 hours. Thyroid Function Tests: No results for input(s): TSH, T4TOTAL, FREET4, T3FREE, THYROIDAB in the last 72 hours. Anemia Panel: No results for input(s): VITAMINB12, FOLATE, FERRITIN, TIBC, IRON, RETICCTPCT in the last 72 hours. Urine analysis:    Component Value Date/Time   COLORURINE STRAW (A) 09/17/2016 1222   APPEARANCEUR CLEAR 09/17/2016 1222   LABSPEC 1.007 09/17/2016 1222   PHURINE 5.0 09/17/2016 1222   GLUCOSEU >=500 (A) 09/17/2016 1222   HGBUR MODERATE (A) 09/17/2016 1222   BILIRUBINUR NEGATIVE 09/17/2016 1222   KETONESUR 5 (A) 09/17/2016 1222   PROTEINUR NEGATIVE 09/17/2016 1222   NITRITE POSITIVE (A) 09/17/2016 1222   LEUKOCYTESUR SMALL (A) 09/17/2016 1222   Sepsis Labs: @LABRCNTIP (procalcitonin:4,lacticidven:4) )No results found for this or any previous visit (from the past 240 hour(s)).   Radiological Exams on Admission: Dg Chest 2 View  Result Date: 09/17/2016 CLINICAL DATA:  Fall. EXAM: CHEST  2 VIEW COMPARISON:  02/04/2016 . FINDINGS: Mediastinum hilar structures normal. Lungs are clear. Interim clearing of previously identified pulmonary interstitial edema. No prominent pleural effusion. No pneumothorax. IMPRESSION: No acute cardiopulmonary disease. Electronically Signed   By: Marcello Moores  Register   On: 09/17/2016 12:12   Ct Head Wo Contrast  Result Date:  09/17/2016 CLINICAL DATA:  Status post fall at 1 a.m. this morning. Patient found down. EXAM: CT HEAD WITHOUT CONTRAST CT CERVICAL SPINE WITHOUT CONTRAST TECHNIQUE: Multidetector CT imaging of the head and cervical spine was performed following the standard protocol without intravenous contrast. Multiplanar CT image reconstructions of the cervical spine were also generated. COMPARISON:  Head CT scan 12/31/2015 and brain MRI 01/01/2016. FINDINGS: CT HEAD FINDINGS Brain: There is some cortical atrophy and chronic microvascular ischemic change. No evidence of acute intracranial abnormality including hemorrhage, infarct, mass lesion, mass effect, midline shift or abnormal extra-axial fluid collection. No hydrocephalus or pneumocephalus. Vascular: Atherosclerosis noted. Skull: Intact. Sinuses/Orbits: Negative. Other: None. CT CERVICAL SPINE FINDINGS Alignment: There is mild reversal the normal cervical lordosis from C2-C5. No traumatic listhesis is identified. Skull base and vertebrae: No acute fracture. No primary bone lesion or focal pathologic process. Soft tissues and spinal canal: No prevertebral fluid or swelling. No visible canal hematoma. Disc levels: Loss of disc space height appears worst at C4-5 and C6-7. Endplate spurring at Q6-7 is also noted. Upper chest: Lung apices are clear. Other: None. IMPRESSION: No acute abnormality head or cervical spine. Mild atrophy and chronic microvascular ischemic change. Atherosclerosis. Cervical spondylosis. Electronically Signed   By: Inge Rise M.D.   On: 09/17/2016 12:32   Ct Cervical Spine Wo Contrast  Result Date: 09/17/2016 CLINICAL DATA:  Status post fall at 1 a.m. this morning. Patient found down. EXAM: CT HEAD WITHOUT CONTRAST CT CERVICAL SPINE WITHOUT CONTRAST TECHNIQUE: Multidetector CT imaging of the head and cervical spine was performed following the standard protocol without intravenous contrast. Multiplanar CT image reconstructions of the cervical spine  were also generated. COMPARISON:  Head CT scan 12/31/2015 and brain MRI 01/01/2016. FINDINGS: CT HEAD FINDINGS Brain: There is some cortical atrophy and chronic microvascular ischemic change. No evidence of acute intracranial abnormality including hemorrhage, infarct, mass lesion, mass effect, midline shift or abnormal extra-axial fluid collection. No hydrocephalus or pneumocephalus. Vascular: Atherosclerosis noted. Skull: Intact. Sinuses/Orbits: Negative. Other: None. CT CERVICAL SPINE FINDINGS Alignment: There is mild reversal the normal cervical lordosis from C2-C5. No traumatic listhesis is identified. Skull base and vertebrae: No acute fracture. No primary bone lesion  or focal pathologic process. Soft tissues and spinal canal: No prevertebral fluid or swelling. No visible canal hematoma. Disc levels: Loss of disc space height appears worst at C4-5 and C6-7. Endplate spurring at W8-6 is also noted. Upper chest: Lung apices are clear. Other: None. IMPRESSION: No acute abnormality head or cervical spine. Mild atrophy and chronic microvascular ischemic change. Atherosclerosis. Cervical spondylosis. Electronically Signed   By: Inge Rise M.D.   On: 09/17/2016 12:32    EKG: Independently reviewed. A-fib at 90 bpm  Assessment/Plan Principal Problem:   DKA (diabetic ketoacidoses) - Insulin infusion- when sugars better control, resume home meds, start clear liquids and add SSI - check A1c- A1c lat July was 12 - according to the daughter when the patient left the rehab facility last year, she was given Insulin at home and became confused. They attributed the confusion to the insulin and then stopped giving it. She had previously been receiving insulin at the hospital and subsequently at rehab.   Active Problems: Dehydrated -stop Lasix and hydrate - last ECHO from 1/7 showed normal EF and grade 1 dCHF  A-fib with RVR- new diagnosis - CHA2DS2-VASc Score at least 5 - ECHO, TFTs - HR slowed with 5  mg IV Lopressor - cont Coreg - ? Fall risk- can give IV Lovenox in hospital and assess fall risk with PT eval  Fall with mild Rhabdomyolysis - PT eval, IVF  UTI, leukocytosis with left shift - Rocephin, f/u U culture    Acute encephalopathy - due to UTI?  Has never missed her meds before per daughter - follow    HTN (hypertension) - Coreg and ARB    CLL (chronic lymphocytic leukemia)   DVT prophylaxis: Lovenox Code Status: Full code  Family Communication: daughter, Melynda Keller Disposition Plan: SDU  Consults called: none  Admission status: observation    Debbe Odea MD [facility] Pager: [email or web address] Password [email or web address] 7PM-7AM, please contact night-coverage   09/17/2016, 6:09 PM

## 2016-09-17 NOTE — ED Triage Notes (Signed)
Per EMS- Patient is from home. Patient had routine labs drawn yesterday by PCP and was called today stating that her glucose was 700 and to come to the ED.

## 2016-09-17 NOTE — ED Triage Notes (Signed)
Patient states she took glimpiride and pioglitazone at 0830 today.

## 2016-09-17 NOTE — ED Provider Notes (Signed)
[facility] DEPT Provider Note   CSN: [id number] Arrival date & time: 09/17/16  1037     History   Chief Complaint Chief Complaint  Patient presents with  . Hyperglycemia  . Fall    HPI Carrie Atkins is a 83 y.o. female.  Carrie Atkins is a 83 y.o. Female who presents to the ED via EMS with her daughter complaining of hyperglycemia and a fall prior to arrival. Patient reports she was ambulating to the bathroom this morning with her walker when it slipped and she fell falling forward. She denies having her head or loss of consciousness. She denies any pain from the injury. She is lying on the floor until her daughter helped her up this morning. Daughter reports her problems with her gait had increased recently. Patient reports she has been having increased problems with her gait since Sunday, or 5 days ago. She was seen by her PCP yesterday who checked blood work and reported that her sugar was over 700. She was encouraged to return to the emergency department. Daughter reports the patient has not been taking her medications for the past 5 days, until last night. Patient did take her evening medications. Daughter reports patient has vomited once yesterday and once today. Patient denies any injury or complaint after the fall. She denies anticoagulation use. She denies fevers, recent illness, focal weakness, numbness, tingling, headache, double vision, neck pain, chest pain, shortness of breath, increased coughing, abdominal pain, diarrhea or rashes.   The history is provided by the patient, medical records and a relative. No language interpreter was used.  Hyperglycemia  Associated symptoms: fatigue, increased thirst, nausea and vomiting   Associated symptoms: no abdominal pain, no chest pain, no dizziness, no dysuria, no fever, no shortness of breath and no weakness   Fall  Pertinent negatives include no chest pain, no abdominal pain, no headaches and no shortness of breath.     Past Medical History:  Diagnosis Date  . Chest pain   . Chronic lymphocytic leukemia ([place])   . CLL (chronic lymphocytic leukemia) ([place]) 06/05/2013  . Diabetes mellitus   . Hyperlipidemia   . Hypertension   . Leukemia ([place])   . SOB (shortness of breath)     Patient Active Problem List   Diagnosis Date Noted  . E. coli UTI   . Leukocytosis   . AKI (acute kidney injury) ([place])   . Acute cystitis without hematuria   . Sepsis ([place])   . Hyperglycemia   . Acute encephalopathy 12/31/2015  . DKA (diabetic ketoacidoses) ([place]) 12/31/2015  . Seizure ([place])   . Spinal stenosis, lumbar region, with neurogenic claudication 01/30/2015  . Cervical myelopathy ([place]) 01/30/2015  . Gait difficulty 10/24/2014  . Hyperreflexia 10/24/2014  . Weakness of both lower extremities 10/24/2014  . Diabetic polyneuropathy associated with type 2 diabetes mellitus ([place]) 10/24/2014  . Dyspnea 07/14/2013  . CLL (chronic lymphocytic leukemia) ([place]) 06/05/2013  . HTN (hypertension) 02/03/2011  . Hyperlipidemia 02/03/2011    Past Surgical History:  Procedure Laterality Date  . CARDIOVASCULAR STRESS TEST  05/29/2010   EF 83%  . CHOLECYSTECTOMY    . TONSILLECTOMY    . TOTAL KNEE ARTHROPLASTY    . US ECHOCARDIOGRAPHY  01/10/2007   EF 55-60%  . US ECHOCARDIOGRAPHY  02/23/2006    EF 55-60%    OB History    No data available       Home Medications    Prior to Admission medications   Medication  Sig Start Date End Date Taking? Authorizing Provider  acetaminophen (TYLENOL ARTHRITIS PAIN) 650 MG CR tablet Take 1,300 mg by mouth 2 (two) times daily.     Yes Historical Provider, MD  amoxicillin (AMOXIL) 500 MG capsule Take 2,000 mg by mouth once as needed (before dental appointments).   Yes Historical Provider, MD  aspirin EC 81 MG tablet Take 81 mg by mouth daily.   Yes Historical Provider, MD  CALCIUM-MAGNESIUM-ZINC PO Take 2 tablets by mouth 2 (two) times daily.    Yes Historical Provider, MD   carvedilol (COREG) 25 MG tablet TAKE 1 TABLET BY MOUTH TWICE DAILY 03/20/16  Yes Thayer Headings, MD  ferrous sulfate 325 (65 FE) MG tablet Take 325 mg by mouth daily with breakfast.     Yes Historical Provider, MD  furosemide (LASIX) 40 MG tablet Take 1 tablet (40 mg total) by mouth daily. 01/04/16  Yes Robbie Lis, MD  glimepiride (AMARYL) 4 MG tablet Take 8 mg by mouth daily with breakfast.   Yes Historical Provider, MD  pioglitazone (ACTOS) 30 MG tablet Take 15 mg by mouth daily.   Yes Historical Provider, MD  ranitidine (ZANTAC) 150 MG tablet Take 150 mg by mouth 2 (two) times daily as needed for heartburn.   Yes Historical Provider, MD  valsartan (DIOVAN) 320 MG tablet Take 320 mg by mouth daily.   Yes Historical Provider, MD    Family History Family History  Problem Relation Age of Onset  . Stroke Mother   . Lung cancer Father   . COPD Brother     Social History Social History  Substance Use Topics  . Smoking status: Never Smoker  . Smokeless tobacco: Never Used  . Alcohol use No     Allergies   Sulfonamide derivatives; Hydrocodone; and Sulfa antibiotics   Review of Systems Review of Systems  Constitutional: Positive for fatigue. Negative for chills and fever.  HENT: Negative for congestion and sore throat.   Eyes: Negative for visual disturbance.  Respiratory: Negative for cough and shortness of breath.   Cardiovascular: Negative for chest pain and palpitations.  Gastrointestinal: Positive for nausea and vomiting. Negative for abdominal pain, blood in stool and diarrhea.  Endocrine: Positive for polydipsia.  Genitourinary: Negative for difficulty urinating and dysuria.  Musculoskeletal: Negative for arthralgias, back pain and neck pain.  Skin: Negative for rash and wound.  Neurological: Negative for dizziness, syncope, weakness, light-headedness, numbness and headaches.     Physical Exam Updated Vital Signs BP 137/86 (BP Location: Left Arm)   Pulse (!) 133    Temp 97.8 F (36.6 C) (Oral)   Resp 18   Ht 5\' 1"  (1.549 m)   Wt 66.4 kg   SpO2 100%   BMI 27.66 kg/m   Physical Exam  Constitutional: She is oriented to person, place, and time. She appears well-developed and well-nourished. No distress.  Nontoxic appearing.  HENT:  Head: Normocephalic and atraumatic.  Right Ear: External ear normal.  Left Ear: External ear normal.  Mouth/Throat: Oropharynx is clear and moist.  No visible or palpated signs of head injury or trauma.   Eyes: Conjunctivae and EOM are normal. Pupils are equal, round, and reactive to light. Right eye exhibits no discharge. Left eye exhibits no discharge.  Neck: Normal range of motion. Neck supple. No JVD present.  Cardiovascular: Normal rate, regular rhythm, normal heart sounds and intact distal pulses.  Exam reveals no gallop and no friction rub.   No murmur heard. Pulmonary/Chest:  Effort normal and breath sounds normal. No stridor. No respiratory distress. She has no wheezes. She has no rales. She exhibits no tenderness.  Lungs are clear to ascultation bilaterally. Symmetric chest expansion bilaterally. No increased work of breathing. No rales or rhonchi.    Abdominal: Soft. Bowel sounds are normal. She exhibits no distension. There is no tenderness. There is no rebound and no guarding.  Abdomen is soft and non-tender to palpation.   Musculoskeletal: Normal range of motion. She exhibits no edema, tenderness or deformity.  No midline neck or back tenderness to palpation. No hip or clavicle TTP bilaterally. Good strength to bilateral lower extremities.   Lymphadenopathy:    She has no cervical adenopathy.  Neurological: She is alert and oriented to person, place, and time. No cranial nerve deficit or sensory deficit. Coordination normal.  Patient is alert and oriented 3. Cranial nerves are intact. Speech is clear and coherent. Patient has slowing with left finger to nose. She also has 4 out of 5 grip strength to her  left hand and upper extremity. Patient reports is been ongoing for the past 5 days. No other focal weakness identified. Good strength to her bilateral lower extremities. No pronator drift. EOMs intact.   Skin: Skin is warm and dry. Capillary refill takes less than 2 seconds. No rash noted. She is not diaphoretic. No erythema. No pallor.  Psychiatric: She has a normal mood and affect. Her behavior is normal.  Nursing note and vitals reviewed.    ED Treatments / Results  Labs (all labs ordered are listed, but only abnormal results are displayed) Labs Reviewed  CBC WITH DIFFERENTIAL/PLATELET - Abnormal; Notable for the following:       Result Value   WBC 36.6 (*)    Neutro Abs 22.7 (*)    Lymphs Abs 12.4 (*)    Monocytes Absolute 1.5 (*)    All other components within normal limits  URINALYSIS, ROUTINE W REFLEX MICROSCOPIC - Abnormal; Notable for the following:    Color, Urine STRAW (*)    Glucose, UA >=500 (*)    Hgb urine dipstick MODERATE (*)    Ketones, ur 5 (*)    Nitrite POSITIVE (*)    Leukocytes, UA SMALL (*)    Bacteria, UA RARE (*)    Squamous Epithelial / LPF 0-5 (*)    All other components within normal limits  PROTIME-INR - Abnormal; Notable for the following:    Prothrombin Time 17.0 (*)    All other components within normal limits  COMPREHENSIVE METABOLIC PANEL - Abnormal; Notable for the following:    Sodium 132 (*)    Chloride 92 (*)    CO2 15 (*)    Glucose, Bld 598 (*)    BUN 72 (*)    Creatinine, Ser 1.92 (*)    Total Bilirubin 1.3 (*)    GFR calc non Af Amer 23 (*)    GFR calc Af Amer 27 (*)    Anion gap 25 (*)    All other components within normal limits  CK - Abnormal; Notable for the following:    Total CK 500 (*)    All other components within normal limits  CBG MONITORING, ED - Abnormal; Notable for the following:    Glucose-Capillary 531 (*)    All other components within normal limits  CBG MONITORING, ED - Abnormal; Notable for the following:     Glucose-Capillary 489 (*)    All other components within normal limits  URINE  CULTURE  ETHANOL  APTT  RAPID URINE DRUG SCREEN, HOSP PERFORMED  PATHOLOGIST SMEAR REVIEW  I-STAT VENOUS BLOOD GAS, ED    EKG  EKG Interpretation  Date/Time:  Thursday September 17 2016 13:57:45 EDT Ventricular Rate:  90 PR Interval:    QRS Duration: 108 QT Interval:  365 QTC Calculation: 447 R Axis:   14 Text Interpretation:  Atrial fibrillation Posterior infarct, old Borderline T abnormalities, inferior leads No STEMI.  Confirmed by LONG MD, JOSHUA [phone]) on 09/17/2016 2:04:46 PM       Radiology Dg Chest 2 View  Result Date: 09/17/2016 CLINICAL DATA:  Fall. EXAM: CHEST  2 VIEW COMPARISON:  02/04/2016 . FINDINGS: Mediastinum hilar structures normal. Lungs are clear. Interim clearing of previously identified pulmonary interstitial edema. No prominent pleural effusion. No pneumothorax. IMPRESSION: No acute cardiopulmonary disease. Electronically Signed   By: Marcello Moores  Register   On: 09/17/2016 12:12   Ct Head Wo Contrast  Result Date: 09/17/2016 CLINICAL DATA:  Status post fall at 1 a.m. this morning. Patient found down. EXAM: CT HEAD WITHOUT CONTRAST CT CERVICAL SPINE WITHOUT CONTRAST TECHNIQUE: Multidetector CT imaging of the head and cervical spine was performed following the standard protocol without intravenous contrast. Multiplanar CT image reconstructions of the cervical spine were also generated. COMPARISON:  Head CT scan 12/31/2015 and brain MRI 01/01/2016. FINDINGS: CT HEAD FINDINGS Brain: There is some cortical atrophy and chronic microvascular ischemic change. No evidence of acute intracranial abnormality including hemorrhage, infarct, mass lesion, mass effect, midline shift or abnormal extra-axial fluid collection. No hydrocephalus or pneumocephalus. Vascular: Atherosclerosis noted. Skull: Intact. Sinuses/Orbits: Negative. Other: None. CT CERVICAL SPINE FINDINGS Alignment: There is mild reversal the  normal cervical lordosis from C2-C5. No traumatic listhesis is identified. Skull base and vertebrae: No acute fracture. No primary bone lesion or focal pathologic process. Soft tissues and spinal canal: No prevertebral fluid or swelling. No visible canal hematoma. Disc levels: Loss of disc space height appears worst at C4-5 and C6-7. Endplate spurring at I9-6 is also noted. Upper chest: Lung apices are clear. Other: None. IMPRESSION: No acute abnormality head or cervical spine. Mild atrophy and chronic microvascular ischemic change. Atherosclerosis. Cervical spondylosis. Electronically Signed   By: Inge Rise M.D.   On: 09/17/2016 12:32   Ct Cervical Spine Wo Contrast  Result Date: 09/17/2016 CLINICAL DATA:  Status post fall at 1 a.m. this morning. Patient found down. EXAM: CT HEAD WITHOUT CONTRAST CT CERVICAL SPINE WITHOUT CONTRAST TECHNIQUE: Multidetector CT imaging of the head and cervical spine was performed following the standard protocol without intravenous contrast. Multiplanar CT image reconstructions of the cervical spine were also generated. COMPARISON:  Head CT scan 12/31/2015 and brain MRI 01/01/2016. FINDINGS: CT HEAD FINDINGS Brain: There is some cortical atrophy and chronic microvascular ischemic change. No evidence of acute intracranial abnormality including hemorrhage, infarct, mass lesion, mass effect, midline shift or abnormal extra-axial fluid collection. No hydrocephalus or pneumocephalus. Vascular: Atherosclerosis noted. Skull: Intact. Sinuses/Orbits: Negative. Other: None. CT CERVICAL SPINE FINDINGS Alignment: There is mild reversal the normal cervical lordosis from C2-C5. No traumatic listhesis is identified. Skull base and vertebrae: No acute fracture. No primary bone lesion or focal pathologic process. Soft tissues and spinal canal: No prevertebral fluid or swelling. No visible canal hematoma. Disc levels: Loss of disc space height appears worst at C4-5 and C6-7. Endplate spurring  at V8-9 is also noted. Upper chest: Lung apices are clear. Other: None. IMPRESSION: No acute abnormality head or cervical spine. Mild atrophy and  chronic microvascular ischemic change. Atherosclerosis. Cervical spondylosis. Electronically Signed   By: Inge Rise M.D.   On: 09/17/2016 12:32    Procedures Procedures (including critical care time)  CRITICAL CARE Performed by: Hanley [place]   Total critical care time: 45 minutes  Critical care time was exclusive of separately billable procedures and treating other patients.  Critical care was necessary to treat or prevent imminent or life-threatening deterioration.  Critical care was time spent personally by me on the following activities: development of treatment plan with patient and/or surrogate as well as nursing, discussions with consultants, evaluation of patient's response to treatment, examination of patient, obtaining history from patient or surrogate, ordering and performing treatments and interventions, ordering and review of laboratory studies, ordering and review of radiographic studies, pulse oximetry and re-evaluation of patient's condition.  Medications Ordered in ED Medications  insulin regular (NOVOLIN R,HUMULIN R) 250 Units in sodium chloride 0.9 % 250 mL (1 Units/mL) infusion (not administered)  dextrose 5 %-0.45 % sodium chloride infusion (not administered)  0.9 %  sodium chloride infusion (100 mL/hr Intravenous New Bag/Given 09/17/16 1520)  cefTRIAXone (ROCEPHIN) 1 g in dextrose 5 % 50 mL IVPB (1 g Intravenous New Bag/Given 09/17/16 1523)  sodium chloride 0.9 % bolus 1,000 mL (0 mLs Intravenous Stopped 09/17/16 1431)  sodium chloride 0.9 % bolus 1,000 mL (1,000 mLs Intravenous New Bag/Given 09/17/16 1431)     Initial Impression / Assessment and Plan / ED Course  I have reviewed the triage vital signs and the nursing notes.  Pertinent labs & imaging results that were available during my care of the patient were  reviewed by me and considered in my medical decision making (see chart for details).    This is a 83 y.o. Female who presents to the ED via EMS with her daughter complaining of hyperglycemia and a fall prior to arrival. Patient reports she was ambulating to the bathroom this morning with her walker when it slipped and she fell falling forward. She denies having her head or loss of consciousness. She denies any pain from the injury. She is lying on the floor until her daughter helped her up this morning. Daughter reports her problems with her gait had increased recently. Patient reports she has been having increased problems with her gait since Sunday, or 5 days ago. She was seen by her PCP yesterday who checked blood work and reported that her sugar was over 700. She was encouraged to return to the emergency department. Daughter reports the patient has not been taking her medications for the past 5 days, until last night. Patient did take her evening medications. On exam patient is afebrile and nontoxic appearing. She has dry mucous membranes. She denies any focal weakness, but I do note some slight 4/5 left UE weakness on exam. No other focal neurological deficits noted. Daughter reports she always has some difficulty with her gait, but that this has increased over the past 5 days. Will not activate a code stroke, but will obtain head CT.  Based on chart review most recent echo shows an EF of 55-60%.   Patient was difficult to obtain an IV. Nursing staff and IV team failed. Dr. Regenia Skeeter was able to place an ultrasound IV.  Blood work reveals a glucose of 598 with anion gap of 25. Bicarbonate is 15. Creatinine shows an acute kidney injury with a creatinine of 1.92.  Patient started on fluids and will also start glucostabilizer. Patient in DKA.  CT head and  C spine is unremarkable. Chest x-ray is unremarkable.  CK is elevated at 500. Likely from lying on the floor for several hours this morning.  UA is  nitrite positive with small leukocytes. Urine sent for culture. This would explain the patient's white count of 36,000. We'll provide the patient with Rocephin for urinary tract infection. Plan for admission. Patient and family agrees with plan for admission.  I consulted with [facility] hospitalist Dr. Wynelle Cleveland who accepted the patient for admission.   This patient was discussed with and evaluated by Dr. Lacinda Axon who agrees with assessment and plan.    Final Clinical Impressions(s) / ED Diagnoses   Final diagnoses:  Diabetic ketoacidosis without coma associated with type 2 diabetes mellitus ([place])  AKI (acute kidney injury) ([place])  Acute cystitis with hematuria  Fall, initial encounter  Dehydration  Elevated CK    New Prescriptions New Prescriptions   No medications on file     Waynetta Pean, PA-C 09/17/16 [place], MD 09/19/16 1422

## 2016-09-18 ENCOUNTER — Observation Stay (HOSPITAL_COMMUNITY): Payer: Medicare Other

## 2016-09-18 DIAGNOSIS — E1011 Type 1 diabetes mellitus with ketoacidosis with coma: Secondary | ICD-10-CM | POA: Diagnosis not present

## 2016-09-18 DIAGNOSIS — N179 Acute kidney failure, unspecified: Secondary | ICD-10-CM

## 2016-09-18 DIAGNOSIS — N3001 Acute cystitis with hematuria: Secondary | ICD-10-CM | POA: Diagnosis not present

## 2016-09-18 DIAGNOSIS — Z885 Allergy status to narcotic agent status: Secondary | ICD-10-CM | POA: Diagnosis not present

## 2016-09-18 DIAGNOSIS — E1310 Other specified diabetes mellitus with ketoacidosis without coma: Secondary | ICD-10-CM | POA: Diagnosis not present

## 2016-09-18 DIAGNOSIS — N390 Urinary tract infection, site not specified: Secondary | ICD-10-CM | POA: Diagnosis present

## 2016-09-18 DIAGNOSIS — I4892 Unspecified atrial flutter: Secondary | ICD-10-CM | POA: Diagnosis not present

## 2016-09-18 DIAGNOSIS — M6282 Rhabdomyolysis: Secondary | ICD-10-CM | POA: Diagnosis present

## 2016-09-18 DIAGNOSIS — Z79899 Other long term (current) drug therapy: Secondary | ICD-10-CM | POA: Diagnosis not present

## 2016-09-18 DIAGNOSIS — Z96659 Presence of unspecified artificial knee joint: Secondary | ICD-10-CM | POA: Diagnosis present

## 2016-09-18 DIAGNOSIS — E1142 Type 2 diabetes mellitus with diabetic polyneuropathy: Secondary | ICD-10-CM | POA: Diagnosis present

## 2016-09-18 DIAGNOSIS — E860 Dehydration: Secondary | ICD-10-CM | POA: Diagnosis present

## 2016-09-18 DIAGNOSIS — Z823 Family history of stroke: Secondary | ICD-10-CM | POA: Diagnosis not present

## 2016-09-18 DIAGNOSIS — Z7982 Long term (current) use of aspirin: Secondary | ICD-10-CM | POA: Diagnosis not present

## 2016-09-18 DIAGNOSIS — I10 Essential (primary) hypertension: Secondary | ICD-10-CM | POA: Diagnosis not present

## 2016-09-18 DIAGNOSIS — I959 Hypotension, unspecified: Secondary | ICD-10-CM | POA: Diagnosis not present

## 2016-09-18 DIAGNOSIS — I471 Supraventricular tachycardia: Secondary | ICD-10-CM | POA: Diagnosis present

## 2016-09-18 DIAGNOSIS — C9110 Chronic lymphocytic leukemia of B-cell type not having achieved remission: Secondary | ICD-10-CM | POA: Diagnosis present

## 2016-09-18 DIAGNOSIS — G9340 Encephalopathy, unspecified: Secondary | ICD-10-CM | POA: Diagnosis present

## 2016-09-18 DIAGNOSIS — A419 Sepsis, unspecified organism: Secondary | ICD-10-CM | POA: Diagnosis present

## 2016-09-18 DIAGNOSIS — Z801 Family history of malignant neoplasm of trachea, bronchus and lung: Secondary | ICD-10-CM | POA: Diagnosis not present

## 2016-09-18 DIAGNOSIS — Z1612 Extended spectrum beta lactamase (ESBL) resistance: Secondary | ICD-10-CM | POA: Diagnosis not present

## 2016-09-18 DIAGNOSIS — Z882 Allergy status to sulfonamides status: Secondary | ICD-10-CM | POA: Diagnosis not present

## 2016-09-18 DIAGNOSIS — D649 Anemia, unspecified: Secondary | ICD-10-CM | POA: Diagnosis present

## 2016-09-18 DIAGNOSIS — Z9049 Acquired absence of other specified parts of digestive tract: Secondary | ICD-10-CM | POA: Diagnosis not present

## 2016-09-18 DIAGNOSIS — E1110 Type 2 diabetes mellitus with ketoacidosis without coma: Secondary | ICD-10-CM | POA: Diagnosis present

## 2016-09-18 DIAGNOSIS — I4891 Unspecified atrial fibrillation: Secondary | ICD-10-CM | POA: Diagnosis not present

## 2016-09-18 DIAGNOSIS — N3000 Acute cystitis without hematuria: Secondary | ICD-10-CM | POA: Diagnosis not present

## 2016-09-18 DIAGNOSIS — E876 Hypokalemia: Secondary | ICD-10-CM | POA: Diagnosis present

## 2016-09-18 DIAGNOSIS — W010XXA Fall on same level from slipping, tripping and stumbling without subsequent striking against object, initial encounter: Secondary | ICD-10-CM | POA: Diagnosis present

## 2016-09-18 DIAGNOSIS — E861 Hypovolemia: Secondary | ICD-10-CM | POA: Diagnosis present

## 2016-09-18 DIAGNOSIS — I480 Paroxysmal atrial fibrillation: Secondary | ICD-10-CM | POA: Diagnosis present

## 2016-09-18 DIAGNOSIS — R6521 Severe sepsis with septic shock: Secondary | ICD-10-CM | POA: Diagnosis present

## 2016-09-18 DIAGNOSIS — A499 Bacterial infection, unspecified: Secondary | ICD-10-CM | POA: Diagnosis not present

## 2016-09-18 DIAGNOSIS — Z825 Family history of asthma and other chronic lower respiratory diseases: Secondary | ICD-10-CM | POA: Diagnosis not present

## 2016-09-18 LAB — ECHOCARDIOGRAM COMPLETE
Height: 61 in
WEIGHTICAEL: 2342 [oz_av]

## 2016-09-18 LAB — URINE CULTURE

## 2016-09-18 LAB — BASIC METABOLIC PANEL
ANION GAP: 8 (ref 5–15)
ANION GAP: 8 (ref 5–15)
Anion gap: 8 (ref 5–15)
Anion gap: 8 (ref 5–15)
BUN: 51 mg/dL — AB (ref 6–20)
BUN: 48 mg/dL — AB (ref 6–20)
BUN: 45 mg/dL — ABNORMAL HIGH (ref 6–20)
BUN: 44 mg/dL — AB (ref 6–20)
CALCIUM: 8 mg/dL — AB (ref 8.9–10.3)
CALCIUM: 8 mg/dL — AB (ref 8.9–10.3)
CO2: 23 mmol/L (ref 22–32)
CO2: 24 mmol/L (ref 22–32)
CO2: 23 mmol/L (ref 22–32)
CO2: 22 mmol/L (ref 22–32)
Calcium: 8 mg/dL — ABNORMAL LOW (ref 8.9–10.3)
Calcium: 7.7 mg/dL — ABNORMAL LOW (ref 8.9–10.3)
Chloride: 104 mmol/L (ref 101–111)
Chloride: 103 mmol/L (ref 101–111)
Chloride: 104 mmol/L (ref 101–111)
Chloride: 105 mmol/L (ref 101–111)
Creatinine, Ser: 1.27 mg/dL — ABNORMAL HIGH (ref 0.44–1.00)
Creatinine, Ser: 1.25 mg/dL — ABNORMAL HIGH (ref 0.44–1.00)
Creatinine, Ser: 1.28 mg/dL — ABNORMAL HIGH (ref 0.44–1.00)
Creatinine, Ser: 1.25 mg/dL — ABNORMAL HIGH (ref 0.44–1.00)
GFR calc Af Amer: 45 mL/min — ABNORMAL LOW (ref >60)
GFR calc Af Amer: 45 mL/min — ABNORMAL LOW (ref >60)
GFR, EST AFRICAN AMERICAN: 44 mL/min — AB (ref >60)
GFR, EST AFRICAN AMERICAN: 45 mL/min — AB (ref >60)
GFR, EST NON AFRICAN AMERICAN: 38 mL/min — AB (ref >60)
GFR, EST NON AFRICAN AMERICAN: 39 mL/min — AB (ref >60)
GFR, EST NON AFRICAN AMERICAN: 38 mL/min — AB (ref >60)
GFR, EST NON AFRICAN AMERICAN: 39 mL/min — AB (ref >60)
GLUCOSE: 147 mg/dL — AB (ref 65–99)
GLUCOSE: 105 mg/dL — AB (ref 65–99)
Glucose, Bld: 162 mg/dL — ABNORMAL HIGH (ref 65–99)
Glucose, Bld: 160 mg/dL — ABNORMAL HIGH (ref 65–99)
POTASSIUM: 3.4 mmol/L — AB (ref 3.5–5.1)
POTASSIUM: 3.5 mmol/L (ref 3.5–5.1)
POTASSIUM: 3.4 mmol/L — AB (ref 3.5–5.1)
Potassium: 3.3 mmol/L — ABNORMAL LOW (ref 3.5–5.1)
SODIUM: 135 mmol/L (ref 135–145)
SODIUM: 135 mmol/L (ref 135–145)
SODIUM: 135 mmol/L (ref 135–145)
SODIUM: 135 mmol/L (ref 135–145)

## 2016-09-18 LAB — PATHOLOGIST SMEAR REVIEW

## 2016-09-18 LAB — CBC
HCT: 27.5 % — ABNORMAL LOW (ref 36.0–46.0)
Hemoglobin: 9.6 g/dL — ABNORMAL LOW (ref 12.0–15.0)
MCH: 30.8 pg (ref 26.0–34.0)
MCHC: 34.9 g/dL (ref 30.0–36.0)
MCV: 88.1 fL (ref 78.0–100.0)
PLATELETS: 192 10*3/uL (ref 150–400)
RBC: 3.12 MIL/uL — ABNORMAL LOW (ref 3.87–5.11)
RDW: 13 % (ref 11.5–15.5)
WBC: 18.3 10*3/uL — ABNORMAL HIGH (ref 4.0–10.5)

## 2016-09-18 LAB — GLUCOSE, CAPILLARY
GLUCOSE-CAPILLARY: 122 mg/dL — AB (ref 65–99)
GLUCOSE-CAPILLARY: 134 mg/dL — AB (ref 65–99)
GLUCOSE-CAPILLARY: 161 mg/dL — AB (ref 65–99)
GLUCOSE-CAPILLARY: 189 mg/dL — AB (ref 65–99)
GLUCOSE-CAPILLARY: 79 mg/dL (ref 65–99)
Glucose-Capillary: 122 mg/dL — ABNORMAL HIGH (ref 65–99)
Glucose-Capillary: 182 mg/dL — ABNORMAL HIGH (ref 65–99)
Glucose-Capillary: 163 mg/dL — ABNORMAL HIGH (ref 65–99)
Glucose-Capillary: 147 mg/dL — ABNORMAL HIGH (ref 65–99)

## 2016-09-18 LAB — CK: CK TOTAL: 269 U/L — AB (ref 38–234)

## 2016-09-18 LAB — CORTISOL: Cortisol, Plasma: 21.4 ug/dL

## 2016-09-18 LAB — LACTIC ACID, PLASMA: LACTIC ACID, VENOUS: 2.4 mmol/L — AB (ref 0.5–1.9)

## 2016-09-18 MED ORDER — CARVEDILOL 12.5 MG PO TABS
12.5000 mg | ORAL_TABLET | Freq: Two times a day (BID) | ORAL | Status: DC
Start: 2016-09-18 — End: 2016-09-18

## 2016-09-18 MED ORDER — PREMIER PROTEIN SHAKE
11.0000 [oz_av] | Freq: Two times a day (BID) | ORAL | Status: DC
  Administered 2016-09-18 – 2016-09-23 (×7): 11 [oz_av] via ORAL
  Filled 2016-09-18 (×11): qty 325.31

## 2016-09-18 MED ORDER — SODIUM CHLORIDE 0.9 % IV BOLUS (SEPSIS)
500.0000 mL | Freq: Once | INTRAVENOUS | Status: AC
  Administered 2016-09-18: 500 mL via INTRAVENOUS

## 2016-09-18 MED ORDER — SODIUM CHLORIDE 0.9 % IV SOLN
0.0000 ug/min | INTRAVENOUS | Status: DC
  Administered 2016-09-18: 20 ug/min via INTRAVENOUS
  Administered 2016-09-18: 40 ug/min via INTRAVENOUS
  Administered 2016-09-19: 30 ug/min via INTRAVENOUS
  Administered 2016-09-19: 20 ug/min via INTRAVENOUS
  Filled 2016-09-18 (×4): qty 1

## 2016-09-18 MED ORDER — PIOGLITAZONE HCL 15 MG PO TABS
15.0000 mg | ORAL_TABLET | Freq: Every day | ORAL | Status: DC
  Administered 2016-09-18 – 2016-09-23 (×6): 15 mg via ORAL
  Filled 2016-09-18 (×6): qty 1

## 2016-09-18 MED ORDER — INSULIN ASPART 100 UNIT/ML ~~LOC~~ SOLN
0.0000 [IU] | Freq: Three times a day (TID) | SUBCUTANEOUS | Status: DC
Start: 2016-09-18 — End: 2016-09-24
  Administered 2016-09-18 (×2): 3 [IU] via SUBCUTANEOUS
  Administered 2016-09-19: 8 [IU] via SUBCUTANEOUS
  Administered 2016-09-19 – 2016-09-20 (×3): 5 [IU] via SUBCUTANEOUS
  Administered 2016-09-21: 3 [IU] via SUBCUTANEOUS
  Administered 2016-09-21: 11 [IU] via SUBCUTANEOUS
  Administered 2016-09-21: 5 [IU] via SUBCUTANEOUS
  Administered 2016-09-22: 2 [IU] via SUBCUTANEOUS
  Administered 2016-09-22 – 2016-09-23 (×4): 5 [IU] via SUBCUTANEOUS
  Administered 2016-09-23: 3 [IU] via SUBCUTANEOUS

## 2016-09-18 MED ORDER — GLIMEPIRIDE 4 MG PO TABS
8.0000 mg | ORAL_TABLET | Freq: Every day | ORAL | Status: DC
  Administered 2016-09-18 – 2016-09-23 (×6): 8 mg via ORAL
  Filled 2016-09-18 (×7): qty 2

## 2016-09-18 MED ORDER — DEXTROSE 5 % IV SOLN
1.0000 g | INTRAVENOUS | Status: DC
  Administered 2016-09-18: 1 g via INTRAVENOUS
  Filled 2016-09-18 (×2): qty 1

## 2016-09-18 MED ORDER — ORAL CARE MOUTH RINSE
15.0000 mL | Freq: Two times a day (BID) | OROMUCOSAL | Status: DC
  Administered 2016-09-18 – 2016-09-20 (×5): 15 mL via OROMUCOSAL

## 2016-09-18 MED ORDER — ADULT MULTIVITAMIN W/MINERALS CH
1.0000 | ORAL_TABLET | Freq: Every day | ORAL | Status: DC
  Administered 2016-09-18 – 2016-09-23 (×6): 1 via ORAL
  Filled 2016-09-18 (×6): qty 1

## 2016-09-18 MED ORDER — VANCOMYCIN HCL IN DEXTROSE 1-5 GM/200ML-% IV SOLN
1000.0000 mg | INTRAVENOUS | Status: DC
  Administered 2016-09-18 – 2016-09-20 (×3): 1000 mg via INTRAVENOUS
  Filled 2016-09-18 (×3): qty 200

## 2016-09-18 NOTE — Progress Notes (Addendum)
PROGRESS NOTE  Carrie Atkins RAQ:[id number] DOB: Mar 18, 1934 DOA: 09/17/2016 PCP: [name]., MD   LOS: 0 days   Brief Narrative: 83 y.o. female with medical history significant or CLL, diabetes mellitus with neuropathy, hypertension and mild LV outflow tract obstruction follows with [name], presents with elevated blood sugar in the setting of not taking her own medications at home.  She cannot explain why she stopped her medications at home.  She also has had poor p.o. intake at home.  Assessment & Plan: Principal Problem:   DKA (diabetic ketoacidoses) ([place]) Active Problems:   HTN (hypertension)   CLL (chronic lymphocytic leukemia) ([facility])   Diabetic polyneuropathy associated with type 2 diabetes mellitus ([facility])   Acute encephalopathy   Acute cystitis without hematuria   New onset a-fib ([facility])   DKA (diabetic ketoacidoses) -On admission insulin infusion, now gap closed, she was placed on sliding scale overnight and CBGs are better controlled this morning, continue current modified diet -check A1c- A1c lat July was 12 -according to the daughter when the patient left the rehab facility last year, she was given Insulin at home and became confused. They attributed the confusion to the insulin and then stopped giving it. She had previously been receiving insulin at the hospital and subsequently at rehab.   A-fib with RVR- new diagnosis -CHA2DS2-VASc Score at least 5 -ECHO pending -TSH, free T4 normal -Appears rate controlled this morning, continue Coreg  Acute kidney injury -Likely in the setting of DKA, dehydration, rhabdomyolysis -Continue IV fluids, monitor  Fall with mild Rhabdomyolysis -PT eval, IVF  UTI, leukocytosis with left shift -Rocephin, f/u U culture  Acute encephalopathy -due to UTI?  Has never missed her meds before per daughter -CT scan and MRI of the brain negative  HTN (hypertension) -Coreg and ARB  CLL (chronic lymphocytic leukemia)   DVT  prophylaxis: Lovenox Code Status: Full code Family Communication: no family at bedside Disposition Plan: transfer to telemetry  Consultants:   Cardiology   Procedures:   2D echo: pending  Antimicrobials:  Ceftriaxone    Subjective: - no chest pain, shortness of breath, no abdominal pain, nausea or vomiting.  She appears confused this morning, cannot tell me why she is in the hospital.   Objective: Vitals:   09/18/16 0700 09/18/16 0800 09/18/16 0908 09/18/16 1000  BP: 110/67 (!) 126/43 (!) 117/50   Pulse: 70  (!) 116 60  Resp: (!) 23 (!) 25 17 (!) 23  Temp:  97.7 F (36.5 C)    TempSrc:  Oral    SpO2: 100% 100% 99% 100%  Weight:      Height:        Intake/Output Summary (Last 24 hours) at 09/18/16 1134 Last data filed at 09/18/16 0809  Gross per 24 hour  Intake          4000.88 ml  Output             1025 ml  Net          2975.88 ml   Filed Weights   09/17/16 1053  Weight: 66.4 kg (146 lb 6 oz)    Examination: Constitutional: NAD, pale appearing Vitals:   09/18/16 0700 09/18/16 0800 09/18/16 0908 09/18/16 1000  BP: 110/67 (!) 126/43 (!) 117/50   Pulse: 70  (!) 116 60  Resp: (!) 23 (!) 25 17 (!) 23  Temp:  97.7 F (36.5 C)    TempSrc:  Oral    SpO2: 100% 100% 99% 100%  Weight:      Height:       Eyes: lids and conjunctivae normal ENMT: Mucous membranes are moist. No oropharyngeal exudates Neck: normal, supple, no masses, no thyromegaly Respiratory: clear to auscultation bilaterally, no wheezing, no crackles. Normal respiratory effort.  Cardiovascular: Irregular, No LE edema. 2+ pedal pulses.  Abdomen: no tenderness. Bowel sounds positive.  Musculoskeletal: no clubbing / cyanosis. Skin: no rashes, lesions, ulcers. No induration Neurologic: CN 2-12 grossly intact. Strength 5-/5 RLE and 5/5 in rest Psychiatric: Alert and oriented x person only. Normal mood.    Data Reviewed: I have personally reviewed following labs and imaging  studies  CBC:  Recent Labs Lab 09/17/16 1222 09/18/16 0153  WBC 36.6* 18.3*  NEUTROABS 22.7*  --   HGB 13.2 9.6*  HCT 38.4 27.5*  MCV 92.3 88.1  PLT 346 774   Basic Metabolic Panel:  Recent Labs Lab 09/17/16 1222 09/17/16 2140 09/18/16 0153 09/18/16 0402 09/18/16 1038  NA 132* 137 135 135 135  K 4.1 3.8 3.4* 3.5 3.4*  CL 92* 102 104 103 104  CO2 15* 21* 23 24 23   GLUCOSE 598* 199* 147* 105* 162*  BUN 72* 57* 51* 48* 45*  CREATININE 1.92* 1.43* 1.27* 1.25* 1.28*  CALCIUM 9.8 8.2* 8.0* 8.0* 8.0*   GFR: Estimated Creatinine Clearance: 30 mL/min (A) (by C-G formula based on SCr of 1.28 mg/dL (H)). Liver Function Tests:  Recent Labs Lab 09/17/16 1222  AST 29  ALT 21  ALKPHOS 95  BILITOT 1.3*  PROT 8.0  ALBUMIN 4.2   No results for input(s): LIPASE, AMYLASE in the last 168 hours. No results for input(s): AMMONIA in the last 168 hours. Coagulation Profile:  Recent Labs Lab 09/17/16 1223  INR 1.37   Cardiac Enzymes:  Recent Labs Lab 09/17/16 1329 09/18/16 0402  CKTOTAL 500* 269*   BNP (last 3 results) No results for input(s): PROBNP in the last 8760 hours. HbA1C: No results for input(s): HGBA1C in the last 72 hours. CBG:  Recent Labs Lab 09/18/16 0022 09/18/16 0123 09/18/16 0225 09/18/16 0319 09/18/16 0744  GLUCAP 189* 161* 122* 122* 79   Lipid Profile: No results for input(s): CHOL, HDL, LDLCALC, TRIG, CHOLHDL, LDLDIRECT in the last 72 hours. Thyroid Function Tests:  Recent Labs  09/17/16 1913  TSH 1.285  FREET4 1.02   Anemia Panel: No results for input(s): VITAMINB12, FOLATE, FERRITIN, TIBC, IRON, RETICCTPCT in the last 72 hours. Urine analysis:    Component Value Date/Time   COLORURINE STRAW (A) 09/17/2016 1222   APPEARANCEUR CLEAR 09/17/2016 1222   LABSPEC 1.007 09/17/2016 1222   PHURINE 5.0 09/17/2016 1222   GLUCOSEU >=500 (A) 09/17/2016 1222   HGBUR MODERATE (A) 09/17/2016 1222   BILIRUBINUR NEGATIVE 09/17/2016 1222    KETONESUR 5 (A) 09/17/2016 1222   PROTEINUR NEGATIVE 09/17/2016 1222   NITRITE POSITIVE (A) 09/17/2016 1222   LEUKOCYTESUR SMALL (A) 09/17/2016 1222   Sepsis Labs: Invalid input(s): PROCALCITONIN, LACTICIDVEN  Recent Results (from the past 240 hour(s))  MRSA PCR Screening     Status: None   Collection Time: 09/17/16  5:16 PM  Result Value Ref Range Status   MRSA by PCR NEGATIVE NEGATIVE Final    Comment:        The GeneXpert MRSA Assay (FDA approved for NASAL specimens only), is one component of a comprehensive MRSA colonization surveillance program. It is not intended to diagnose MRSA infection nor to guide or monitor treatment for MRSA infections.  Radiology Studies: Dg Chest 2 View  Result Date: 09/17/2016 CLINICAL DATA:  Fall. EXAM: CHEST  2 VIEW COMPARISON:  02/04/2016 . FINDINGS: Mediastinum hilar structures normal. Lungs are clear. Interim clearing of previously identified pulmonary interstitial edema. No prominent pleural effusion. No pneumothorax. IMPRESSION: No acute cardiopulmonary disease. Electronically Signed   By: Marcello Moores  Register   On: 09/17/2016 12:12   Ct Head Wo Contrast  Result Date: 09/17/2016 CLINICAL DATA:  Status post fall at 1 a.m. this morning. Patient found down. EXAM: CT HEAD WITHOUT CONTRAST CT CERVICAL SPINE WITHOUT CONTRAST TECHNIQUE: Multidetector CT imaging of the head and cervical spine was performed following the standard protocol without intravenous contrast. Multiplanar CT image reconstructions of the cervical spine were also generated. COMPARISON:  Head CT scan 12/31/2015 and brain MRI 01/01/2016. FINDINGS: CT HEAD FINDINGS Brain: There is some cortical atrophy and chronic microvascular ischemic change. No evidence of acute intracranial abnormality including hemorrhage, infarct, mass lesion, mass effect, midline shift or abnormal extra-axial fluid collection. No hydrocephalus or pneumocephalus. Vascular: Atherosclerosis noted. Skull: Intact.  Sinuses/Orbits: Negative. Other: None. CT CERVICAL SPINE FINDINGS Alignment: There is mild reversal the normal cervical lordosis from C2-C5. No traumatic listhesis is identified. Skull base and vertebrae: No acute fracture. No primary bone lesion or focal pathologic process. Soft tissues and spinal canal: No prevertebral fluid or swelling. No visible canal hematoma. Disc levels: Loss of disc space height appears worst at C4-5 and C6-7. Endplate spurring at F5-8 is also noted. Upper chest: Lung apices are clear. Other: None. IMPRESSION: No acute abnormality head or cervical spine. Mild atrophy and chronic microvascular ischemic change. Atherosclerosis. Cervical spondylosis. Electronically Signed   By: Inge Rise M.D.   On: 09/17/2016 12:32   Ct Cervical Spine Wo Contrast  Result Date: 09/17/2016 CLINICAL DATA:  Status post fall at 1 a.m. this morning. Patient found down. EXAM: CT HEAD WITHOUT CONTRAST CT CERVICAL SPINE WITHOUT CONTRAST TECHNIQUE: Multidetector CT imaging of the head and cervical spine was performed following the standard protocol without intravenous contrast. Multiplanar CT image reconstructions of the cervical spine were also generated. COMPARISON:  Head CT scan 12/31/2015 and brain MRI 01/01/2016. FINDINGS: CT HEAD FINDINGS Brain: There is some cortical atrophy and chronic microvascular ischemic change. No evidence of acute intracranial abnormality including hemorrhage, infarct, mass lesion, mass effect, midline shift or abnormal extra-axial fluid collection. No hydrocephalus or pneumocephalus. Vascular: Atherosclerosis noted. Skull: Intact. Sinuses/Orbits: Negative. Other: None. CT CERVICAL SPINE FINDINGS Alignment: There is mild reversal the normal cervical lordosis from C2-C5. No traumatic listhesis is identified. Skull base and vertebrae: No acute fracture. No primary bone lesion or focal pathologic process. Soft tissues and spinal canal: No prevertebral fluid or swelling. No visible  canal hematoma. Disc levels: Loss of disc space height appears worst at C4-5 and C6-7. Endplate spurring at V0-7 is also noted. Upper chest: Lung apices are clear. Other: None. IMPRESSION: No acute abnormality head or cervical spine. Mild atrophy and chronic microvascular ischemic change. Atherosclerosis. Cervical spondylosis. Electronically Signed   By: Inge Rise M.D.   On: 09/17/2016 12:32   Mr Brain Wo Contrast  Result Date: 09/18/2016 CLINICAL DATA:  Weakness.  Found on floor. EXAM: MRI HEAD WITHOUT CONTRAST TECHNIQUE: Multiplanar, multiecho pulse sequences of the brain and surrounding structures were obtained without intravenous contrast. COMPARISON:  CT head 09/17/2016 FINDINGS: Brain: Negative for acute infarct. Mild chronic changes in the white matter. Generalized atrophy without hydrocephalus. Negative for hemorrhage or mass lesion. Vascular: Normal arterial flow void.  Skull and upper cervical spine: Negative Sinuses/Orbits: Negative Other: None IMPRESSION: No acute abnormality. Atrophy and mild chronic white matter ischemia. Electronically Signed   By: Franchot Gallo M.D.   On: 09/18/2016 08:58     Scheduled Meds: . aspirin EC  81 mg Oral Daily  . carvedilol  25 mg Oral BID WC  . cefTRIAXone (ROCEPHIN)  IV  1 g Intravenous Q24H  . enoxaparin (LOVENOX) injection  1 mg/kg Subcutaneous Q24H  . famotidine  20 mg Oral BID  . ferrous sulfate  325 mg Oral Q breakfast  . glimepiride  8 mg Oral Q breakfast  . insulin aspart  0-15 Units Subcutaneous TID WC  . irbesartan  300 mg Oral Daily  . mouth rinse  15 mL Mouth Rinse BID  . pioglitazone  15 mg Oral Daily  . senna  1 tablet Oral BID  . sodium chloride flush  3 mL Intravenous Q12H   Continuous Infusions: . sodium chloride 100 mL/hr at 09/18/16 0800    Marzetta Board, MD, PhD [facility] Pager [phone]  If 7PM-7AM, please contact night-coverage [email or web address] Password [facility] 09/18/2016, 11:34 AM

## 2016-09-18 NOTE — Progress Notes (Addendum)
Initial Nutrition Assessment  DOCUMENTATION CODES:   Not applicable  INTERVENTION:  - Will order Premier Protein BID, each supplement provides 160 kcal and 30 grams of protein.  - Will order multivitamin with minerals. - RD will continue to monitor for additional needs.  NUTRITION DIAGNOSIS:   Inadequate oral intake related to acute illness, chronic illness, vomiting, nausea as evidenced by per patient/family report.  GOAL:   Patient will meet greater than or equal to 90% of their needs  MONITOR:   PO intake, Supplement acceptance, Weight trends, Labs  REASON FOR ASSESSMENT:   Malnutrition Screening Tool  ASSESSMENT:   83 y.o. female with medical history significant or CLL, diabetes mellitus with neuropathy, hypertension and mild LV outflow tract obstruction follows with [name] resents with elevated blood sugar as mentioned above. The patient's daughter gives most of the history. The patient lives in an apartment which is an addition on the daughter's home. She states that she noted that her mother had not taken pills since Sunday when she looked at her pill box. She took her to see her PCP yesterday for this reason and gave her Actos and Amaryl last night. The patient fell on the floor last night and was found there by her daughter today.   Pt seen for MST. BMI indicates overweight status, appropriate for age. No intakes documented since admission. Pt sleeping soundly and did not awake to name call x5; no family/visitors present at this time.   Unable to perform physical assessment at this time but will do so at follow-up. Pt had reported increased thirst, fatigue, and N/V in the days PTA. She had also reported 20 lb weight loss since September 2017. Per chart review, pt has lost 49 lbs (25% body weight) since July (9 months, which is significant for time frame. Unable to confirm malnutrition at this time but suspect that pt likely does meet criteria; will document at  follow-up.  Medications reviewed; 20 mg oral Pepcid BID, 325 mg/day, sliding scale Novolog, 15 mg oral Actos/day, 1 tablet Senokot BID. Labs reviewed; CBGs: 79-189 mg/dL, BUN: 48 mg/dL, creatinine: 1.25 mg/dL, Ca: 8 mg/dL, GFR: 39 mL/min.   IVF: NS @ 100 mL/hr.   ADDENDUM: Returned to the floor and pt was sitting up, eating lunch. RD attempted to engage with pt but pt simply continued eating and did not want to talk at this time other than to indicate that she ate a few bites of breakfast which consisted of scrambled eggs and ham. Remain unable to perform physical assessment at this time.    Diet Order:  Diet Carb Modified Fluid consistency: Thin; Room service appropriate? Yes  Skin:  Reviewed, no issues  Last BM:  4/4 (PTA)  Height:   Ht Readings from Last 1 Encounters:  09/17/16 5\' 1"  (1.549 m)    Weight:   Wt Readings from Last 1 Encounters:  09/17/16 146 lb 6 oz (66.4 kg)    Ideal Body Weight:  47.73 kg  BMI:  Body mass index is 27.66 kg/m.  Estimated Nutritional Needs:   Kcal:  1530-1725 (23-26 kcal/kg)  Protein:  65-80 grams (1-1.2 grams/kg)  Fluid:  >/= 1.6 L/day  EDUCATION NEEDS:   No education needs identified at this time    Jarome Matin, MS, RD, LDN, CNSC Inpatient Clinical Dietitian Pager # [phone] After hours/weekend pager # [phone]

## 2016-09-18 NOTE — Consult Note (Signed)
Cardiology Consult    Patient ID: Carrie Atkins MRN: [id number], DOB/AGE: [birth year]   Admit date: 09/17/2016 Date of Consult: 09/18/2016  Primary Physician: [name]., MD Reason for Consult: new onset atrial fibrillation Primary Cardiologist: [name] Requesting Provider: Cruzita Lederer  History of Present Illness    Carrie Atkins is a 83 y.o. female who is being seen today for the evaluation of  New onset atrial fibrillation at the request of Dr Cruzita Lederer.  The patient has a past medical history significant for mild LV outflow tract obstruction, HTN, CLL and Diabetes Type 2. The patient has fallen and was found on the floor in the morning by her daughter who also noted that the patient had not taken her meds since Sunday by looking at her pill box. She had an elevated glucose level over 700 at home and was 598 on admission.   The patient was found to be in atrial fibrillation in the ED. The patient is not very clear on her recent symptoms. She denies any chest pain and may have had some trouble breathing on Saturday. She reports that she has had some edema and was taking a fluid pill that made her pee all the time, but is not clear on whether this was recent and just prior to this hospitalization.   She is currently having sinus rhythm with PAC's in the 60's-90's with intermittent atrial fib in the low 100'-110's.  Frequent falls and loss of balance noted by chart review for 2016, 2017. At her last office visit with [name] in 09/2015 her hypertension was noted to be fairly well controlled, no evidence of dynamic obstruction related to her LV hypertrophy, mild DOE and no changes were made.   She was hospitalized in 12/2015 for acute encephalopathy related to sepsis thought to be due to UTI. She had an indeterminate rapid rhythm at that time.   Past Medical History   Past Medical History:  Diagnosis Date  . Chest pain   . Chronic lymphocytic leukemia ([place])   . CLL (chronic  lymphocytic leukemia) ([place]) 06/05/2013  . Diabetes mellitus   . Hyperlipidemia   . Hypertension   . Leukemia ([place])   . SOB (shortness of breath)     Past Surgical History:  Procedure Laterality Date  . CARDIOVASCULAR STRESS TEST  05/29/2010   EF 83%  . CHOLECYSTECTOMY    . TONSILLECTOMY    . TOTAL KNEE ARTHROPLASTY    . US ECHOCARDIOGRAPHY  01/10/2007   EF 55-60%  . US ECHOCARDIOGRAPHY  02/23/2006    EF 55-60%     Allergies  Allergies  Allergen Reactions  . Sulfonamide Derivatives Nausea And Vomiting  . Hydrocodone Nausea And Vomiting  . Sulfa Antibiotics Nausea And Vomiting    Inpatient Medications    . aspirin EC  81 mg Oral Daily  . carvedilol  25 mg Oral BID WC  . cefTRIAXone (ROCEPHIN)  IV  1 g Intravenous Q24H  . enoxaparin (LOVENOX) injection  1 mg/kg Subcutaneous Q24H  . famotidine  20 mg Oral BID  . ferrous sulfate  325 mg Oral Q breakfast  . glimepiride  8 mg Oral Q breakfast  . insulin aspart  0-15 Units Subcutaneous TID WC  . irbesartan  300 mg Oral Daily  . mouth rinse  15 mL Mouth Rinse BID  . pioglitazone  15 mg Oral Daily  . senna  1 tablet Oral BID  . sodium chloride flush  3 mL Intravenous Q12H  Family History    Family History  Problem Relation Age of Onset  . Stroke Mother   . Lung cancer Father   . COPD Brother      Social History    Social History   Social History  . Marital status: Widowed    Spouse name: N/A  . Number of children: 1  . Years of education: 56   Occupational History  . Not on file.   Social History Main Topics  . Smoking status: Never Smoker  . Smokeless tobacco: Never Used  . Alcohol use No  . Drug use: No  . Sexual activity: Not on file   Other Topics Concern  . Not on file   Social History Narrative   Lives in "mother in law suite" at her sons house   Drinks decaf     Review of Systems    The patient is not very clear on her recent symptoms General:  No chills, fever, night sweats or weight  changes.  Cardiovascular:  Had shortness of breath on Saturday. No chest pain, orthopnea, palpitations, paroxysmal nocturnal dyspnea. Dermatological: No rash, lesions/masses Respiratory: No cough, dyspnea Abdominal:   No nausea, vomiting, diarrhea, bright red blood per rectum, melena, or hematemesis Neurologic:  No visual changes, wkns, changes in mental status. All other systems reviewed and are otherwise negative except as noted above.  Physical Exam   Blood pressure (!) 117/50, pulse 60, temperature 98.7 F (37.1 C), temperature source Oral, resp. rate (!) 23, height 5\' 1"  (1.549 m), weight 146 lb 6 oz (66.4 kg), SpO2 100 %.  General: Pleasant, NAD Psych: Normal affect. Neuro: Alert and oriented X 3. Moves all extremities spontaneously. HEENT: Normal  Neck: Supple without bruits or JVD. Lungs:  Resp regular and unlabored, CTA. Heart: RRR with occ irregular beat, no s3, s4, or murmurs. Abdomen: Soft, non-tender, non-distended, BS + x 4.  Extremities: No clubbing, cyanosis or edema. DP/PT/Radials 2+ and equal bilaterally.  Labs    Troponin (Point of Care Test) No results for input(s): TROPIPOC in the last 72 hours.  Recent Labs  09/17/16 1329 09/18/16 0402  CKTOTAL 500* 269*   Lab Results  Component Value Date   WBC 18.3 (H) 09/18/2016   HGB 9.6 (L) 09/18/2016   HCT 27.5 (L) 09/18/2016   MCV 88.1 09/18/2016   PLT 192 09/18/2016    Recent Labs Lab 09/17/16 1222  09/18/16 0402  NA 132*  < > 135  K 4.1  < > 3.5  CL 92*  < > 103  CO2 15*  < > 24  BUN 72*  < > 48*  CREATININE 1.92*  < > 1.25*  CALCIUM 9.8  < > 8.0*  PROT 8.0  --   --   BILITOT 1.3*  --   --   ALKPHOS 95  --   --   ALT 21  --   --   AST 29  --   --   GLUCOSE 598*  < > 105*  < > = values in this interval not displayed. Lab Results  Component Value Date   CHOL 153 02/03/2011   HDL 34.90 (L) 02/03/2011   LDLCALC 86 02/03/2011   TRIG 163.0 (H) 02/03/2011   No results found for: [facility]     Radiology Studies    Dg Chest 2 View  Result Date: 09/17/2016 CLINICAL DATA:  Fall. EXAM: CHEST  2 VIEW COMPARISON:  02/04/2016 . FINDINGS: Mediastinum hilar structures normal. Lungs are clear.  Interim clearing of previously identified pulmonary interstitial edema. No prominent pleural effusion. No pneumothorax. IMPRESSION: No acute cardiopulmonary disease. Electronically Signed   By: Marcello Moores  Register   On: 09/17/2016 12:12   Ct Head Wo Contrast  Result Date: 09/17/2016 CLINICAL DATA:  Status post fall at 1 a.m. this morning. Patient found down. EXAM: CT HEAD WITHOUT CONTRAST CT CERVICAL SPINE WITHOUT CONTRAST TECHNIQUE: Multidetector CT imaging of the head and cervical spine was performed following the standard protocol without intravenous contrast. Multiplanar CT image reconstructions of the cervical spine were also generated. COMPARISON:  Head CT scan 12/31/2015 and brain MRI 01/01/2016. FINDINGS: CT HEAD FINDINGS Brain: There is some cortical atrophy and chronic microvascular ischemic change. No evidence of acute intracranial abnormality including hemorrhage, infarct, mass lesion, mass effect, midline shift or abnormal extra-axial fluid collection. No hydrocephalus or pneumocephalus. Vascular: Atherosclerosis noted. Skull: Intact. Sinuses/Orbits: Negative. Other: None. CT CERVICAL SPINE FINDINGS Alignment: There is mild reversal the normal cervical lordosis from C2-C5. No traumatic listhesis is identified. Skull base and vertebrae: No acute fracture. No primary bone lesion or focal pathologic process. Soft tissues and spinal canal: No prevertebral fluid or swelling. No visible canal hematoma. Disc levels: Loss of disc space height appears worst at C4-5 and C6-7. Endplate spurring at L3-9 is also noted. Upper chest: Lung apices are clear. Other: None. IMPRESSION: No acute abnormality head or cervical spine. Mild atrophy and chronic microvascular ischemic change. Atherosclerosis. Cervical spondylosis.  Electronically Signed   By: Inge Rise M.D.   On: 09/17/2016 12:32   Ct Cervical Spine Wo Contrast  Result Date: 09/17/2016 CLINICAL DATA:  Status post fall at 1 a.m. this morning. Patient found down. EXAM: CT HEAD WITHOUT CONTRAST CT CERVICAL SPINE WITHOUT CONTRAST TECHNIQUE: Multidetector CT imaging of the head and cervical spine was performed following the standard protocol without intravenous contrast. Multiplanar CT image reconstructions of the cervical spine were also generated. COMPARISON:  Head CT scan 12/31/2015 and brain MRI 01/01/2016. FINDINGS: CT HEAD FINDINGS Brain: There is some cortical atrophy and chronic microvascular ischemic change. No evidence of acute intracranial abnormality including hemorrhage, infarct, mass lesion, mass effect, midline shift or abnormal extra-axial fluid collection. No hydrocephalus or pneumocephalus. Vascular: Atherosclerosis noted. Skull: Intact. Sinuses/Orbits: Negative. Other: None. CT CERVICAL SPINE FINDINGS Alignment: There is mild reversal the normal cervical lordosis from C2-C5. No traumatic listhesis is identified. Skull base and vertebrae: No acute fracture. No primary bone lesion or focal pathologic process. Soft tissues and spinal canal: No prevertebral fluid or swelling. No visible canal hematoma. Disc levels: Loss of disc space height appears worst at C4-5 and C6-7. Endplate spurring at Q3-0 is also noted. Upper chest: Lung apices are clear. Other: None. IMPRESSION: No acute abnormality head or cervical spine. Mild atrophy and chronic microvascular ischemic change. Atherosclerosis. Cervical spondylosis. Electronically Signed   By: Inge Rise M.D.   On: 09/17/2016 12:32   Mr Brain Wo Contrast  Result Date: 09/18/2016 CLINICAL DATA:  Weakness.  Found on floor. EXAM: MRI HEAD WITHOUT CONTRAST TECHNIQUE: Multiplanar, multiecho pulse sequences of the brain and surrounding structures were obtained without intravenous contrast. COMPARISON:  CT head  09/17/2016 FINDINGS: Brain: Negative for acute infarct. Mild chronic changes in the white matter. Generalized atrophy without hydrocephalus. Negative for hemorrhage or mass lesion. Vascular: Normal arterial flow void. Skull and upper cervical spine: Negative Sinuses/Orbits: Negative Other: None IMPRESSION: No acute abnormality. Atrophy and mild chronic white matter ischemia. Electronically Signed   By: Franchot Gallo M.D.  On: 09/18/2016 08:58   EKG & Cardiac Imaging   EKG: Atrial fibrillation at 90 bpm, non-specific T wave changes inferiorly  Echocardiogram: Pending Last echo 07/11/2015 Study Conclusions  - Left ventricle: The cavity size was normal. Wall thickness was   normal. Systolic function was normal. The estimated ejection   fraction was in the range of 55% to 60%. Wall motion was normal;   there were no regional wall motion abnormalities. Doppler   parameters are consistent with abnormal left ventricular   relaxation (grade 1 diastolic dysfunction).  Assessment & Plan    Atrial fibrillation  -Has history of mild LV outflow tract obstruction. EF 55-60%. No history of afib. -She was noted to be tachycardiac with rates in the 130's in the ED. EKG shows atrial fibrillation with rate controlled. HR slowed with IV metoprolol and now sinus rhythm with intermittent afib in the low 100's. -TSH 1.285 -CHA2DS2/VAS Stroke Risk Score 6 (CHF, HTN, Age (2), DM, female) . Pt has frequent falls and confusion. She may not be a good candidate for anticoagulation. Fall risk being assessed with PT per IM. If pt going back home she would be at increased risk for bleeding and at possible not taking her meds safely.  -This is likely reactionary in setting of UTI, dehydration, rhabdo after a fall. -Continue coreg and prn metoprolol for sustained tachycardia. Work on correcting underlying issues of UTI, dehydration, DKA      Dehydration -Lasix stopped and pt being hydrated.   Fall with  rhabdomyolysis -CK 500--> 269 -IV fluids.  HTN -At home on carvedilol and valsartan -Currently BP 93-126/37-75 -Consider holding ARB due to acute renal insufficiency, although improving with hydration  Renal insufficiency -SCr 1.92---> 1.28.  Her SCr in 06/2015 was normal  <1 -Likely related to dehydration and her recent fall and not eating and drinking at home.   DM Type 2 with DKA -Glucose level >700 PTA and 598 on arrival to ED.  Management by IM  UTI, leukocytosis with left shift - Rocephin, f/u U culture.  Managed by IM  SignedDaune Perch, NP-C 09/18/2016, 10:45 AM Pager: [phone]  As above, patient seen and examined. Briefly she is an 83 year old female with past medical history of left ventricular outflow tract obstruction, hypertension, diabetes mellitus, CLL who Dr. Cruzita Lederer has consulted me for paroxysmal atrial fibrillation. At time of evaluation patient is somnolent and mildly confused. She does not know the year and thinks she is at [facility]. She denies dyspnea, chest pain, palpitations or syncope. She apparently fell recently and was found by her daughter the next morning. She was admitted with DKA, elevated CK, and acute encephalopathy. Her initial electrocardiogram was interpreted as atrial fibrillation but appears to show sinus with PACs. However telemetry suggests possible atrial flutter versus ectopic atrial tachycardia. Cardiology now asked to evaluate.  Laboratories significant for creatinine 1.28, hemoglobin 9.6, white blood cell count 18.3, normal TSH at 1.285  1 paroxysmal atrial tachycardia versus atrial flutter-patient in sinus rhythm. Patient in sinus rhythm at time of evaluation. Would continue beta blockade for rate control if atrial arrhythmias recur. However her blood pressure is low and I will decrease Coreg to 12.5 mg twice a day. Await echocardiogram. CHADSvasc 5. She would benefit from anticoagulation long-term but she has had multiple  falls in the past based on Dr. Elmarie Shiley notes and she was found on the ground prior to this admission. She also apparently was not taking her medications as prescribed. I feel  the risk of anticoagulation outweighs the benefit. Continue aspirin. We'll change Lovenox to DVT prophylaxis dose.  2 Diabetic ketoacidosis-management per primary care.  3 hypertension-patient's blood pressure is low. Will discontinue ARB for now. Decrease carvedilol to 12.5 mg twice a day. Follow blood pressure and adjust regimen as needed.  4 acute on chronic kidney disease-improving with hydration.  5. Rhabdomyolysis-also improving. Further management per primary care.  6 CLL  Patient should follow up with [name] following DC.  Kirk Ruths MD

## 2016-09-18 NOTE — Progress Notes (Signed)
MD notified for low BP. Pt also somnolent and only responding to painful stimuli. BS 183. 500 cc bolus ordered. Pt now alert at 1245. Will continue to monitor pt closely.

## 2016-09-18 NOTE — Consult Note (Addendum)
PULMONARY / CRITICAL CARE MEDICINE   Name: Carrie Atkins MRN: [id number] DOB: 08-02-1933    ADMISSION DATE:  09/17/2016 CONSULTATION DATE:  09/18/16  REFERRING MD:  Dr. Cruzita Lederer  CHIEF COMPLAINT:  Hypotension   HISTORY OF PRESENT ILLNESS:  83 y/o F who presented [facility] on 4/[facility] via EMS after being called by her PCP reporting her glucose was >700 on labs the day prior to admit.    She has a medical history of DM, neuropathy, CLL, HTN and mild LV outflow tract obstruction (followed by Dr. Cathie Olden).  She lives in an apartment which is an addition to the daughters home.  The patient reported on admit that she fell at 0100 and slept on the floor until her daughter came to visit 0800.  Family reported on admit that they had been taking her food but she has not been eating.  They actually found the food in the trash.  They also report she has not been taking her medications as prescribed (found all of her medications in the weekly pill box).  She has had difficulty with walking / being unsteady.    She was hemodynamically stable on presentation.  The patient was admitted per [facility] for further evaluation.  EKG showed rate controlled AF on admit.  She was felt to have mild rhabdomyolysis on presentation, UTI, DKA and volume depletion.  Home lasix was held on admit.  She initially improved but on 4/6 she developed hypotension.  The patient has been treated with 2L NS 4/6 with improvement in blood pressure but it remains soft.  Home Coreg continued during admission for AF with RVR.   PCCM consulted for hypotension.   PAST MEDICAL HISTORY :  She  has a past medical history of Chest pain; Chronic lymphocytic leukemia ([place]); CLL (chronic lymphocytic leukemia) ([place]) (06/05/2013); Diabetes mellitus; Hyperlipidemia; Hypertension; Leukemia ([place]); and SOB (shortness of breath).  PAST SURGICAL HISTORY: She  has a past surgical history that includes Total knee arthroplasty; Tonsillectomy; Cholecystectomy; US  ECHOCARDIOGRAPHY (01/10/2007); US ECHOCARDIOGRAPHY (02/23/2006); and Cardiovascular stress test (05/29/2010).  Allergies  Allergen Reactions  . Sulfonamide Derivatives Nausea And Vomiting  . Hydrocodone Nausea And Vomiting  . Sulfa Antibiotics Nausea And Vomiting    No current facility-administered medications on file prior to encounter.    Current Outpatient Prescriptions on File Prior to Encounter  Medication Sig  . acetaminophen (TYLENOL ARTHRITIS PAIN) 650 MG CR tablet Take 1,300 mg by mouth 2 (two) times daily.    Marland Kitchen CALCIUM-MAGNESIUM-ZINC PO Take 2 tablets by mouth 2 (two) times daily.   . carvedilol (COREG) 25 MG tablet TAKE 1 TABLET BY MOUTH TWICE DAILY  . ferrous sulfate 325 (65 FE) MG tablet Take 325 mg by mouth daily with breakfast.    . furosemide (LASIX) 40 MG tablet Take 1 tablet (40 mg total) by mouth daily.    FAMILY HISTORY:  Her indicated that her mother is deceased. She indicated that her father is deceased. She indicated that her sister is alive. She indicated that her brother is deceased.    SOCIAL HISTORY: She  reports that she has never smoked. She has never used smokeless tobacco. She reports that she does not drink alcohol or use drugs.  REVIEW OF SYSTEMS:  Unable to complete as patient is altered / confused.   SUBJECTIVE:  Feels better. No subjective complaints.    VITAL SIGNS: BP (!) 78/28   Pulse (!) 58   Temp 98 F (36.7 C) (Oral)  Resp (!) 27   Ht 5\' 1"  (1.549 m)   Wt 146 lb 6 oz (66.4 kg)   SpO2 100%   BMI 27.66 kg/m   HEMODYNAMICS:    VENTILATOR SETTINGS:    INTAKE / OUTPUT: I/O last 3 completed shifts: In: 3697.9 [P.O.:360; I.V.:1287.9; IV [id number]] Out: 900 [Urine:900]  PHYSICAL EXAMINATION: General:  Elderly female in NAD.  HEENT: MM pink/moist PSY: calm  Neuro: awake, alert, speech clear, pleasantly confused, MAE CV: s1s2 rrr, no m/r/g PULM: even/non-labored, lungs bilaterally clear EY:CXKG, non-tender, bsx4 active   Extremities: warm/dry, no edema  Skin: no rashes or lesions   LABS:  BMET  Recent Labs Lab 09/18/16 0402 09/18/16 1038 09/18/16 1356  NA 135 135 135  K 3.5 3.4* 3.3*  CL 103 104 105  CO2 24 23 22   BUN 48* 45* 44*  CREATININE 1.25* 1.28* 1.25*  GLUCOSE 105* 162* 160*    Electrolytes  Recent Labs Lab 09/18/16 0402 09/18/16 1038 09/18/16 1356  CALCIUM 8.0* 8.0* 7.7*    CBC  Recent Labs Lab 09/17/16 1222 09/18/16 0153  WBC 36.6* 18.3*  HGB 13.2 9.6*  HCT 38.4 27.5*  PLT 346 192    Coag's  Recent Labs Lab 09/17/16 1223  APTT 26  INR 1.37    Sepsis Markers No results for input(s): LATICACIDVEN, PROCALCITON, O2SATVEN in the last 168 hours.  ABG No results for input(s): PHART, PCO2ART, PO2ART in the last 168 hours.  Liver Enzymes  Recent Labs Lab 09/17/16 1222  AST 29  ALT 21  ALKPHOS 95  BILITOT 1.3*  ALBUMIN 4.2    Cardiac Enzymes No results for input(s): TROPONINI, PROBNP in the last 168 hours.  Glucose  Recent Labs Lab 09/18/16 0123 09/18/16 0225 09/18/16 0319 09/18/16 0423 09/18/16 0744 09/18/16 1152  GLUCAP 161* 122* 122* 134* 79 182*    Imaging Mr Brain Wo Contrast  Result Date: 09/18/2016 CLINICAL DATA:  Weakness.  Found on floor. EXAM: MRI HEAD WITHOUT CONTRAST TECHNIQUE: Multiplanar, multiecho pulse sequences of the brain and surrounding structures were obtained without intravenous contrast. COMPARISON:  CT head 09/17/2016 FINDINGS: Brain: Negative for acute infarct. Mild chronic changes in the white matter. Generalized atrophy without hydrocephalus. Negative for hemorrhage or mass lesion. Vascular: Normal arterial flow void. Skull and upper cervical spine: Negative Sinuses/Orbits: Negative Other: None IMPRESSION: No acute abnormality. Atrophy and mild chronic white matter ischemia. Electronically Signed   By: Franchot Gallo M.D.   On: 09/18/2016 08:58     STUDIES:  UDS 4/5 >> negative  CT Head / Cervical Spine 4/5 >>  no acute abnormality  ECHO 4/6 >>   CULTURES: UC 4/5 >> multiple species  ANTIBIOTICS: Cefepime 4/4 >>  Vanco 4/4 >>  SIGNIFICANT EVENTS: 4/04  Admit with AMS, s/p fall  4/06  PCCM consulted for hypotension   LINES/TUBES:   DISCUSSION: 83 year old female, known diabetic, lives behind her daughter's house, was brought in after being found unresponsive with an elevated sugar. She was complaining of poor appetite, has not been eating for 3 days because of nausea, vomiting, feeling unwell. She was treated as a diabetic emergency  (CBG 700mg %, HCO3 15) with insulin, hydration. Her sugars improved quickly. The last 12 hours however, she was noted to be hypotensive likely related to hypovolemia and meds (She was hypertensive and had sinus tachycardia on admission and got prn lopressor).    ASSESSMENT / PLAN:  Hypotension Iikely related to hypovolemia, given history of poor by mouth intake for last  few days. Her urinalysis was dirty so there could be occult infection.  - By the  time and saw her, she was on her third liter bolus. Her blood pressure was 110/50, MAP 70. She was mentating fine. Making urine. By that time, she would've been 5.4 L positive. (-) dyspnea - Continue maintenance fluids at 75 ML's an hour. May need D5 1/2 NS depending on CBG. - If her BP drops again, may try Neo-Synephrine peripherally. May give it up to 100 mcg/kg/m peripherally. Keep MAP > 65 mm Hg.  Obviously, let us know if she ends up requiring a high dose of neosynephrine.  At that point, may need levophed and CVL.  - Diet as tolerated. - Cont  broad-spectrum antibiotics for now with cefepime and vancomycin pending cultures. - will d/c coreg.    Sinus tachycardia, atrial tachycardia.  - cardiology following.  - hold off on BP meds for now 2/2 hypotension.    AKI related to hypovolemia. - Her BUN and creatinine are improved with hydration. - Cont  maintenance fluids for now.   Hyperglycemia, improved -  Suggest to keep her on sliding scale for now.   CLL - follows with Oncology   Best practice  - Lovenox for DVT prophylaxis.    FAMILY  - Updates:  patient, daughter, son-in-law, updated at bedside.    I spent  30  minutes of Critical Care time with this patient today. PCCM will stay as consult for now unless she worsens clinically overnight/weekend.    Monica Becton, MD 09/18/2016, 4:44 PM [facility] Pager [phone] After 3 pm or if no answer, call [phone]

## 2016-09-18 NOTE — Progress Notes (Signed)
CRITICAL VALUE ALERT  Critical value received:  LA 2.4  Date of notification:  09/18/16   Time of notification:  1400  Critical value read back:yes  Nurse who received alert:  P.beck   MD notified (1st page):  Alfredo Martinez  Time of first page:  1400  Responding MD:  Alfredo Martinez  Time MD responded:  1400

## 2016-09-18 NOTE — Progress Notes (Signed)
Pharmacy Antibiotic Note  Carrie Atkins is a 83 y.o. female admitted on 09/17/2016 with DKA, now concerned for sepsis.  Pharmacy has been consulted for vancomycin and cefepime dosing.  AKI with some improvement today.  WBC elevated.    Plan: Vancomycin 1g IV q24h.  Monitor SCr daily. Cefepime 1g IV q24h.  Height: 5\' 1"  (154.9 cm) Weight: 146 lb 6 oz (66.4 kg) IBW/kg (Calculated) : 47.8  Temp (24hrs), Avg:98.2 F (36.8 C), Min:97.7 F (36.5 C), Max:99 F (37.2 C)   Recent Labs Lab 09/17/16 1222 09/17/16 2140 09/18/16 0153 09/18/16 0402 09/18/16 1038  WBC 36.6*  --  18.3*  --   --   CREATININE 1.92* 1.43* 1.27* 1.25* 1.28*    Estimated Creatinine Clearance: 30 mL/min (A) (by C-G formula based on SCr of 1.28 mg/dL (H)).    Allergies  Allergen Reactions  . Sulfonamide Derivatives Nausea And Vomiting  . Hydrocodone Nausea And Vomiting  . Sulfa Antibiotics Nausea And Vomiting    Antimicrobials this admission: 4/5 Ceftriaxone x 1 4/6 Cefepime >> 4/6 Vancomycin >>   Dose adjustments this admission:  Microbiology results: 4/5 UCx: multiple species, suggest recollection 4/5 MRSA PCR: neg  Thank you for allowing pharmacy to be a part of this patient's care.  Hershal Coria 09/18/2016 2:17 PM

## 2016-09-18 NOTE — Evaluation (Signed)
Physical Therapy Evaluation Patient Details Name: Carrie Atkins MRN: [id number] DOB: April 02, 1934 Today's Date: 09/18/2016   History of Present Illness  83 y.o. female with medical history significant for CLL, diabetes mellitus with neuropathy, hypertension and mild LV outflow tract obstruction and admitted for DKA, concern for sepsis, acute encephalopathy  Clinical Impression  Pt admitted with above diagnosis. Pt currently with functional limitations due to the deficits listed below (see PT Problem List).  Pt will benefit from skilled PT to increase their independence and safety with mobility to allow discharge to the venue listed below.  Pt finishing bolus upon entering room however BP remains low.  Pt initially falling asleep eating on arrival however able to remain awake and answer questions.  Pt started falling asleep again and became more lethargic.  Pt had food in her mouth so had her remove this into offered tissue and put lunch tray away from bed. Unable to further assess pt due to lethargy, RN notified.     Follow Up Recommendations SNF;Supervision/Assistance - 24 hour    Equipment Recommendations  Wheelchair (measurements PT);Wheelchair cushion (measurements PT);Hospital bed    Recommendations for Other Services       Precautions / Restrictions Precautions Precautions: Fall Precaution Comments: monitor VS      Mobility  Bed Mobility               General bed mobility comments: unable to perform due to lethargy, RN stated she was up to w/c this morning however required more assist to get back into bed  Transfers                    Ambulation/Gait                Stairs            Wheelchair Mobility    Modified Rankin (Stroke Patients Only)       Balance                                             Pertinent Vitals/Pain Pain Assessment: Faces Faces Pain Scale: Hurts a little bit Pain Location: does not appear in  distress, difficult to assess due to lethargy    Home Living Family/patient expects to be discharged to:: Private residence Living Arrangements: Alone Available Help at Discharge: Family;Available PRN/intermittently         Home Layout: One level Home Equipment: Walker - 4 wheels Additional Comments: uncertain of accuracy pt became lethargic    Prior Function Level of Independence: Independent with assistive device(s)         Comments: uses rollator, pt states she was mostly staying in bed prior to admission     Hand Dominance        Extremity/Trunk Assessment   Upper Extremity Assessment Upper Extremity Assessment: Generalized weakness;Difficult to assess due to impaired cognition    Lower Extremity Assessment Lower Extremity Assessment: Generalized weakness;Difficult to assess due to impaired cognition       Communication      Cognition Arousal/Alertness: Lethargic   Overall Cognitive Status: Difficult to assess                                 General Comments: pt eating and falling asleep on arrival, able to answer questions  however then became lethargic, RN notified      General Comments      Exercises     Assessment/Plan    PT Assessment Patient needs continued PT services  PT Problem List Decreased strength;Decreased mobility;Decreased balance;Decreased knowledge of use of DME;Decreased activity tolerance;Decreased cognition       PT Treatment Interventions DME instruction;Gait training;Therapeutic exercise;Therapeutic activities;Patient/family education;Functional mobility training;Balance training;Wheelchair mobility training    PT Goals (Current goals can be found in the Care Plan section)  Acute Rehab PT Goals PT Goal Formulation: Patient unable to participate in goal setting Time For Goal Achievement: 10/02/16 Potential to Achieve Goals: Fair    Frequency Min 3X/week   Barriers to discharge        Co-evaluation                End of Session   Activity Tolerance: Patient limited by lethargy Patient left: in bed;with bed alarm set Nurse Communication: Other (comment) (lethargy) PT Visit Diagnosis: Difficulty in walking, not elsewhere classified (R26.2);Muscle weakness (generalized) (M62.81)    Time: 4970-2637 PT Time Calculation (min) (ACUTE ONLY): 12 min   Charges:   PT Evaluation $PT Eval Low Complexity: 1 Procedure     PT G Codes:   PT G-Codes **NOT FOR INPATIENT CLASS** Functional Assessment Tool Used: AM-PAC 6 Clicks Basic Mobility;Clinical judgement Functional Limitation: Mobility: Walking and moving around Mobility: Walking and Moving Around Current Status (C5885): 100 percent impaired, limited or restricted Mobility: Walking and Moving Around Goal Status (O2774): At least 40 percent but less than 60 percent impaired, limited or restricted    Carmelia Bake, PT, DPT 09/18/2016 Pager: [phone]   York Ram E 09/18/2016, 2:50 PM

## 2016-09-18 NOTE — Progress Notes (Signed)
MD notified for persistent low BP. Received order for another 500 cc Ns bolus. Will continue to monitor pt closely.

## 2016-09-19 DIAGNOSIS — A419 Sepsis, unspecified organism: Principal | ICD-10-CM

## 2016-09-19 DIAGNOSIS — R6521 Severe sepsis with septic shock: Secondary | ICD-10-CM

## 2016-09-19 DIAGNOSIS — E1011 Type 1 diabetes mellitus with ketoacidosis with coma: Secondary | ICD-10-CM

## 2016-09-19 DIAGNOSIS — N3000 Acute cystitis without hematuria: Secondary | ICD-10-CM

## 2016-09-19 DIAGNOSIS — I959 Hypotension, unspecified: Secondary | ICD-10-CM

## 2016-09-19 LAB — CBC
HEMATOCRIT: 25.9 % — AB (ref 36.0–46.0)
Hemoglobin: 8.8 g/dL — ABNORMAL LOW (ref 12.0–15.0)
MCH: 31.8 pg (ref 26.0–34.0)
MCHC: 34 g/dL (ref 30.0–36.0)
MCV: 93.5 fL (ref 78.0–100.0)
Platelets: 214 10*3/uL (ref 150–400)
RBC: 2.77 MIL/uL — ABNORMAL LOW (ref 3.87–5.11)
RDW: 13.5 % (ref 11.5–15.5)
WBC: 21 10*3/uL — ABNORMAL HIGH (ref 4.0–10.5)

## 2016-09-19 LAB — HEMOGLOBIN A1C
HEMOGLOBIN A1C: 12.6 % — AB (ref 4.8–5.6)
MEAN PLASMA GLUCOSE: 315 mg/dL

## 2016-09-19 LAB — GLUCOSE, CAPILLARY
GLUCOSE-CAPILLARY: 78 mg/dL (ref 65–99)
GLUCOSE-CAPILLARY: 251 mg/dL — AB (ref 65–99)
GLUCOSE-CAPILLARY: 178 mg/dL — AB (ref 65–99)
Glucose-Capillary: 233 mg/dL — ABNORMAL HIGH (ref 65–99)

## 2016-09-19 LAB — BASIC METABOLIC PANEL
Anion gap: 7 (ref 5–15)
BUN: 31 mg/dL — AB (ref 6–20)
CALCIUM: 7.3 mg/dL — AB (ref 8.9–10.3)
CO2: 18 mmol/L — ABNORMAL LOW (ref 22–32)
Chloride: 113 mmol/L — ABNORMAL HIGH (ref 101–111)
Creatinine, Ser: 0.99 mg/dL (ref 0.44–1.00)
GFR calc Af Amer: >60 mL/min (ref >60)
GFR, EST NON AFRICAN AMERICAN: 52 mL/min — AB (ref >60)
GLUCOSE: 97 mg/dL (ref 65–99)
Potassium: 3.2 mmol/L — ABNORMAL LOW (ref 3.5–5.1)
Sodium: 138 mmol/L (ref 135–145)

## 2016-09-19 MED ORDER — FAMOTIDINE 20 MG PO TABS
20.0000 mg | ORAL_TABLET | Freq: Every day | ORAL | Status: DC
  Administered 2016-09-20 – 2016-09-23 (×4): 20 mg via ORAL
  Filled 2016-09-19 (×5): qty 1

## 2016-09-19 MED ORDER — DEXTROSE 5 % IV SOLN
1.0000 g | Freq: Two times a day (BID) | INTRAVENOUS | Status: DC
  Administered 2016-09-19 – 2016-09-21 (×4): 1 g via INTRAVENOUS
  Filled 2016-09-19 (×5): qty 1

## 2016-09-19 NOTE — Progress Notes (Signed)
PHARMACY - PHYSICIAN COMMUNICATION CRITICAL VALUE ALERT - BLOOD CULTURE IDENTIFICATION (BCID)  Results for orders placed or performed during the hospital encounter of 09/17/16  Blood Culture ID Panel (Reflexed) (Collected: 09/18/2016  6:42 PM)  Result Value Ref Range   Enterococcus species NOT DETECTED NOT DETECTED   Listeria monocytogenes NOT DETECTED NOT DETECTED   Staphylococcus species DETECTED (A) NOT DETECTED   Staphylococcus aureus NOT DETECTED NOT DETECTED   Methicillin resistance DETECTED (A) NOT DETECTED   Streptococcus species NOT DETECTED NOT DETECTED   Streptococcus agalactiae NOT DETECTED NOT DETECTED   Streptococcus pneumoniae NOT DETECTED NOT DETECTED   Streptococcus pyogenes NOT DETECTED NOT DETECTED   Acinetobacter baumannii NOT DETECTED NOT DETECTED   Enterobacteriaceae species NOT DETECTED NOT DETECTED   Enterobacter cloacae complex NOT DETECTED NOT DETECTED   Escherichia coli NOT DETECTED NOT DETECTED   Klebsiella oxytoca NOT DETECTED NOT DETECTED   Klebsiella pneumoniae NOT DETECTED NOT DETECTED   Proteus species NOT DETECTED NOT DETECTED   Serratia marcescens NOT DETECTED NOT DETECTED   Haemophilus influenzae NOT DETECTED NOT DETECTED   Neisseria meningitidis NOT DETECTED NOT DETECTED   Pseudomonas aeruginosa NOT DETECTED NOT DETECTED   Candida albicans NOT DETECTED NOT DETECTED   Candida glabrata NOT DETECTED NOT DETECTED   Candida krusei NOT DETECTED NOT DETECTED   Candida parapsilosis NOT DETECTED NOT DETECTED   Candida tropicalis NOT DETECTED NOT DETECTED    Name of physician (or Provider) Contacted: Hugelmeyer  Changes to prescribed antibiotics required: None, already on vanc; possible contam so would not narrow yet.  Lissett Favorite A 09/19/2016  8:52 PM

## 2016-09-19 NOTE — Progress Notes (Signed)
PROGRESS NOTE  Carrie Atkins HCW:[id number] DOB: 07-15-1933 DOA: 09/17/2016 PCP: [name]., MD   LOS: 1 day   Brief Narrative: 83 y.o. female with medical history significant or CLL, diabetes mellitus with neuropathy, hypertension and mild LV outflow tract obstruction follows with [name], presents with elevated blood sugar in the setting of not taking her own medications at home.  She cannot explain why she stopped her medications at home.  She also has had poor p.o. intake at home.  She was admitted to stepdown due to requirements for insulin infusion, she was otherwise stable on admission, however on 4/6 in early afternoon she started becoming more and more hypotensive.  The CCM was consulted, her antibiotic regimen was broadened however eventually needed to be started on pressors.  Assessment & Plan: Principal Problem:   DKA (diabetic ketoacidoses) ([place]) Active Problems:   HTN (hypertension)   CLL (chronic lymphocytic leukemia) ([facility])   Diabetic polyneuropathy associated with type 2 diabetes mellitus ([facility])   Acute encephalopathy   Acute cystitis without hematuria   AKI (acute kidney injury) ([place])   New onset a-fib ([place])  Shock -Unclear etiology, questionable dehydration versus septic.  She required pressors overnight, attempt to wean today.  Noted worsening leukocytosis with a white count of 21 today -Cortisol was appropriately elevated at 21 -Continue broad-spectrum antibiotics with vancomycin and cefepime, urine culture resulted with multiple species, blood cultures are negative to date -Appreciate PCCM consultation  DKA (diabetic ketoacidoses) -On admission insulin infusion, now gap closed, she was placed on sliding scale overnight and CBGs are better controlled this morning, continue current modified diet and her home medications -Hemoglobin A1c is 12.6 -according to the daughter when the patient left the rehab facility last year, she was given Insulin at home and  became confused. They attributed the confusion to the insulin and then stopped giving it. She had previously been receiving insulin at the hospital and subsequently at rehab.   A-fib with RVR -new diagnosis -CHA2DS2-VASc Score at least 5 -ECHO on 4/6 showed ejection fraction of 60-65%, mild LVH and grade 1 diastolic dysfunction -TSH, free T4 normal -Cardiology following  Acute kidney injury -Likely in the setting of DKA, dehydration, rhabdomyolysis, resolving creatinine this morning is 0.99  Fall with mild Rhabdomyolysis -PT eval recommended SNF  UTI, leukocytosis with left shift -Antibiotics broadened 4/6 as above  Acute encephalopathy -due to possible underlying infectious process versus DKA, improving -CT scan and MRI of the brain negative  HTN (hypertension) -now on pressors  CLL (chronic lymphocytic leukemia)    DVT prophylaxis: Lovenox Code Status: Full code Family Communication: no family at bedside, d/w daughter over the phone  Disposition Plan: transfer to telemetry  Consultants:   Cardiology   PCCM  Procedures:   2D echo Study Conclusions - Left ventricle: The cavity size was normal. Wall thickness was increased in a pattern of mild LVH. Systolic function was normal. The estimated ejection fraction was in the range of 60% to 65%. Wall motion was normal; there were no regional wall motion abnormalities. Doppler parameters are consistent with abnormal left ventricular relaxation (grade 1 diastolic dysfunction). Left atrium: The atrium was mildly dilated. Atrial septum: No defect or patent foramen ovale was identified. Pulmonary arteries: PA peak pressure: 37 mm Hg (S).  Antimicrobials:  Ceftriaxone 4/5 >> 4/6  Vancomycin 4/6 >>   Cefepime 4/6 >>  Subjective: - seems sleepy this morning, but wakes up promptly, denies any chest pain, denies any shortness of breath, states  that she is tired and wants to sleep   Objective: Vitals:   09/19/16 0400  09/19/16 0500 09/19/16 0600 09/19/16 0700  BP: (!) 94/24 (!) 98/27 (!) 94/35   Pulse: (!) 57 (!) 59 (!) 55   Resp: 20 12 19    Temp:    98.2 F (36.8 C)  TempSrc:    Oral  SpO2: 96% 96% 96%   Weight:      Height:        Intake/Output Summary (Last 24 hours) at 09/19/16 1024 Last data filed at 09/19/16 1000  Gross per 24 hour  Intake          6297.08 ml  Output             1500 ml  Net          4797.08 ml   Filed Weights   09/17/16 1053  Weight: 66.4 kg (146 lb 6 oz)    Examination: Constitutional: NAD, pale appearing Vitals:   09/19/16 0400 09/19/16 0500 09/19/16 0600 09/19/16 0700  BP: (!) 94/24 (!) 98/27 (!) 94/35   Pulse: (!) 57 (!) 59 (!) 55   Resp: 20 12 19    Temp:    98.2 F (36.8 C)  TempSrc:    Oral  SpO2: 96% 96% 96%   Weight:      Height:       Eyes: lids and conjunctivae normal ENMT: Mucous membranes are moist. No oropharyngeal exudates Neck: normal, supple, no masses, no thyromegaly Respiratory: clear to auscultation bilaterally, no wheezing, no crackles. Normal respiratory effort.  Cardiovascular: Regular, no M, No LE edema. 2+ pedal pulses.  Abdomen: no tenderness. Bowel sounds positive.  Neurologic: CN 2-12 grossly intact. Strength 5-/5 RLE and 5/5 in rest   Data Reviewed: I have personally reviewed following labs and imaging studies  CBC:  Recent Labs Lab 09/17/16 1222 09/18/16 0153 09/19/16 0332  WBC 36.6* 18.3* 21.0*  NEUTROABS 22.7*  --   --   HGB 13.2 9.6* 8.8*  HCT 38.4 27.5* 25.9*  MCV 92.3 88.1 93.5  PLT 346 192 237   Basic Metabolic Panel:  Recent Labs Lab 09/18/16 0153 09/18/16 0402 09/18/16 1038 09/18/16 1356 09/19/16 0332  NA 135 135 135 135 138  K 3.4* 3.5 3.4* 3.3* 3.2*  CL 104 103 104 105 113*  CO2 23 24 23 22  18*  GLUCOSE 147* 105* 162* 160* 97  BUN 51* 48* 45* 44* 31*  CREATININE 1.27* 1.25* 1.28* 1.25* 0.99  CALCIUM 8.0* 8.0* 8.0* 7.7* 7.3*   GFR: Estimated Creatinine Clearance: 38.8 mL/min (by C-G  formula based on SCr of 0.99 mg/dL). Liver Function Tests:  Recent Labs Lab 09/17/16 1222  AST 29  ALT 21  ALKPHOS 95  BILITOT 1.3*  PROT 8.0  ALBUMIN 4.2   No results for input(s): LIPASE, AMYLASE in the last 168 hours. No results for input(s): AMMONIA in the last 168 hours. Coagulation Profile:  Recent Labs Lab 09/17/16 1223  INR 1.37   Cardiac Enzymes:  Recent Labs Lab 09/17/16 1329 09/18/16 0402  CKTOTAL 500* 269*   BNP (last 3 results) No results for input(s): PROBNP in the last 8760 hours. HbA1C:  Recent Labs  09/18/16 1130  HGBA1C 12.6*   CBG:  Recent Labs Lab 09/18/16 0744 09/18/16 1152 09/18/16 1612 09/18/16 2127 09/19/16 0751  GLUCAP 79 182* 163* 147* 78   Lipid Profile: No results for input(s): CHOL, HDL, LDLCALC, TRIG, CHOLHDL, LDLDIRECT in the last 72 hours. Thyroid  Function Tests:  Recent Labs  09/17/16 1913  TSH 1.285  FREET4 1.02   Anemia Panel: No results for input(s): VITAMINB12, FOLATE, FERRITIN, TIBC, IRON, RETICCTPCT in the last 72 hours. Urine analysis:    Component Value Date/Time   COLORURINE STRAW (A) 09/17/2016 1222   APPEARANCEUR CLEAR 09/17/2016 1222   LABSPEC 1.007 09/17/2016 1222   PHURINE 5.0 09/17/2016 1222   GLUCOSEU >=500 (A) 09/17/2016 1222   HGBUR MODERATE (A) 09/17/2016 1222   BILIRUBINUR NEGATIVE 09/17/2016 1222   KETONESUR 5 (A) 09/17/2016 1222   PROTEINUR NEGATIVE 09/17/2016 1222   NITRITE POSITIVE (A) 09/17/2016 1222   LEUKOCYTESUR SMALL (A) 09/17/2016 1222   Sepsis Labs: Invalid input(s): PROCALCITONIN, LACTICIDVEN  Recent Results (from the past 240 hour(s))  Urine culture     Status: Abnormal   Collection Time: 09/17/16 12:23 PM  Result Value Ref Range Status   Specimen Description URINE, RANDOM  Final   Special Requests NONE  Final   Culture MULTIPLE SPECIES PRESENT, SUGGEST RECOLLECTION (A)  Final   Report Status 09/18/2016 FINAL  Final  MRSA PCR Screening     Status: None    Collection Time: 09/17/16  5:16 PM  Result Value Ref Range Status   MRSA by PCR NEGATIVE NEGATIVE Final    Comment:        The GeneXpert MRSA Assay (FDA approved for NASAL specimens only), is one component of a comprehensive MRSA colonization surveillance program. It is not intended to diagnose MRSA infection nor to guide or monitor treatment for MRSA infections.   Culture, blood (Routine X 2) w Reflex to ID Panel     Status: None (Preliminary result)   Collection Time: 09/18/16  6:42 PM  Result Value Ref Range Status   Specimen Description BLOOD RIGHT HAND  Final   Special Requests   Final    IN PEDIATRIC BOTTLE Blood Culture adequate volume Performed at [facility] [address], [place], [place] [address]    Culture PENDING  Incomplete   Report Status PENDING  Incomplete  Culture, blood (Routine X 2) w Reflex to ID Panel     Status: None (Preliminary result)   Collection Time: 09/18/16  7:51 PM  Result Value Ref Range Status   Specimen Description   Final    BLOOD RIGHT HAND Performed at [facility], [place] [address] [place], [place] [address]    Special Requests IN PEDIATRIC BOTTLE Blood Culture adequate volume  Final   Culture PENDING  Incomplete   Report Status PENDING  Incomplete      Radiology Studies: Dg Chest 2 View  Result Date: 09/17/2016 CLINICAL DATA:  Fall. EXAM: CHEST  2 VIEW COMPARISON:  02/04/2016 . FINDINGS: Mediastinum hilar structures normal. Lungs are clear. Interim clearing of previously identified pulmonary interstitial edema. No prominent pleural effusion. No pneumothorax. IMPRESSION: No acute cardiopulmonary disease. Electronically Signed   By: Marcello Moores  Register   On: 09/17/2016 12:12   Ct Head Wo Contrast  Result Date: 09/17/2016 CLINICAL DATA:  Status post fall at 1 a.m. this morning. Patient found down. EXAM: CT HEAD WITHOUT CONTRAST CT CERVICAL SPINE WITHOUT CONTRAST TECHNIQUE: Multidetector CT imaging of the head and cervical  spine was performed following the standard protocol without intravenous contrast. Multiplanar CT image reconstructions of the cervical spine were also generated. COMPARISON:  Head CT scan 12/31/2015 and brain MRI 01/01/2016. FINDINGS: CT HEAD FINDINGS Brain: There is some cortical atrophy and chronic microvascular ischemic change. No evidence of  acute intracranial abnormality including hemorrhage, infarct, mass lesion, mass effect, midline shift or abnormal extra-axial fluid collection. No hydrocephalus or pneumocephalus. Vascular: Atherosclerosis noted. Skull: Intact. Sinuses/Orbits: Negative. Other: None. CT CERVICAL SPINE FINDINGS Alignment: There is mild reversal the normal cervical lordosis from C2-C5. No traumatic listhesis is identified. Skull base and vertebrae: No acute fracture. No primary bone lesion or focal pathologic process. Soft tissues and spinal canal: No prevertebral fluid or swelling. No visible canal hematoma. Disc levels: Loss of disc space height appears worst at C4-5 and C6-7. Endplate spurring at M4-2 is also noted. Upper chest: Lung apices are clear. Other: None. IMPRESSION: No acute abnormality head or cervical spine. Mild atrophy and chronic microvascular ischemic change. Atherosclerosis. Cervical spondylosis. Electronically Signed   By: Inge Rise M.D.   On: 09/17/2016 12:32   Ct Cervical Spine Wo Contrast  Result Date: 09/17/2016 CLINICAL DATA:  Status post fall at 1 a.m. this morning. Patient found down. EXAM: CT HEAD WITHOUT CONTRAST CT CERVICAL SPINE WITHOUT CONTRAST TECHNIQUE: Multidetector CT imaging of the head and cervical spine was performed following the standard protocol without intravenous contrast. Multiplanar CT image reconstructions of the cervical spine were also generated. COMPARISON:  Head CT scan 12/31/2015 and brain MRI 01/01/2016. FINDINGS: CT HEAD FINDINGS Brain: There is some cortical atrophy and chronic microvascular ischemic change. No evidence of acute  intracranial abnormality including hemorrhage, infarct, mass lesion, mass effect, midline shift or abnormal extra-axial fluid collection. No hydrocephalus or pneumocephalus. Vascular: Atherosclerosis noted. Skull: Intact. Sinuses/Orbits: Negative. Other: None. CT CERVICAL SPINE FINDINGS Alignment: There is mild reversal the normal cervical lordosis from C2-C5. No traumatic listhesis is identified. Skull base and vertebrae: No acute fracture. No primary bone lesion or focal pathologic process. Soft tissues and spinal canal: No prevertebral fluid or swelling. No visible canal hematoma. Disc levels: Loss of disc space height appears worst at C4-5 and C6-7. Endplate spurring at A8-3 is also noted. Upper chest: Lung apices are clear. Other: None. IMPRESSION: No acute abnormality head or cervical spine. Mild atrophy and chronic microvascular ischemic change. Atherosclerosis. Cervical spondylosis. Electronically Signed   By: Inge Rise M.D.   On: 09/17/2016 12:32   Mr Brain Wo Contrast  Result Date: 09/18/2016 CLINICAL DATA:  Weakness.  Found on floor. EXAM: MRI HEAD WITHOUT CONTRAST TECHNIQUE: Multiplanar, multiecho pulse sequences of the brain and surrounding structures were obtained without intravenous contrast. COMPARISON:  CT head 09/17/2016 FINDINGS: Brain: Negative for acute infarct. Mild chronic changes in the white matter. Generalized atrophy without hydrocephalus. Negative for hemorrhage or mass lesion. Vascular: Normal arterial flow void. Skull and upper cervical spine: Negative Sinuses/Orbits: Negative Other: None IMPRESSION: No acute abnormality. Atrophy and mild chronic white matter ischemia. Electronically Signed   By: Franchot Gallo M.D.   On: 09/18/2016 08:58     Scheduled Meds: . aspirin EC  81 mg Oral Daily  . ceFEPime (MAXIPIME) IV  1 g Intravenous Q24H  . enoxaparin (LOVENOX) injection  1 mg/kg Subcutaneous Q24H  . famotidine  20 mg Oral BID  . ferrous sulfate  325 mg Oral Q breakfast   . glimepiride  8 mg Oral Q breakfast  . insulin aspart  0-15 Units Subcutaneous TID WC  . mouth rinse  15 mL Mouth Rinse BID  . multivitamin with minerals  1 tablet Oral Daily  . pioglitazone  15 mg Oral Daily  . protein supplement shake  11 oz Oral BID BM  . senna  1 tablet Oral BID  .  sodium chloride flush  3 mL Intravenous Q12H  . vancomycin  1,000 mg Intravenous Q24H   Continuous Infusions: . sodium chloride 150 mL/hr at 09/19/16 0625  . phenylephrine (NEO-SYNEPHRINE) Adult infusion 20 mcg/min (09/19/16 0815)    Marzetta Board, MD, PhD [facility] Pager [phone]  If 7PM-7AM, please contact night-coverage [email or web address] Password [facility] 09/19/2016, 10:24 AM

## 2016-09-19 NOTE — Progress Notes (Signed)
PULMONARY / CRITICAL CARE MEDICINE   Name: Carrie Atkins MRN: [id number] DOB: 1933-08-19    ADMISSION DATE:  09/17/2016 CONSULTATION DATE:  09/18/16  REFERRING MD:  Dr. Cruzita Lederer  CHIEF COMPLAINT:  Hypotension   HISTORY OF PRESENT ILLNESS:  83 y/o F who presented to [facility] on 4/5 via EMS after being called by her PCP reporting her glucose was >700 on labs the day prior to admit.    She has a medical history of DM, neuropathy, CLL, HTN and mild LV outflow tract obstruction (followed by Dr. Cathie Olden).  She lives in an apartment which is an addition to the daughters home.  The patient reported on admit that she fell at 0100 and slept on the floor until her daughter came to visit 0800.  Family reported on admit that they had been taking her food but she has not been eating.  They actually found the food in the trash.  They also report she has not been taking her medications as prescribed (found all of her medications in the weekly pill box).  She has had difficulty with walking / being unsteady.    She was hemodynamically stable on presentation.  The patient was admitted per [facility] for further evaluation.  EKG showed rate controlled AF on admit.  She was felt to have mild rhabdomyolysis on presentation, UTI, DKA and volume depletion.  Home lasix was held on admit.  She initially improved but on 4/6 she developed hypotension.  The patient has been treated with 2L NS 4/6 with improvement in blood pressure but it remains soft.  Home Coreg continued during admission for AF with RVR.   SUBJECTIVE:  She is awake, states that she feels better but "tired and weak" Remains on phenylephrine 20 but overall blood pressure appears improved   VITAL SIGNS: BP (!) 101/58   Pulse 65   Temp 98.2 F (36.8 C) (Oral)   Resp (!) 28   Ht 5\' 1"  (1.549 m)   Wt 66.4 kg (146 lb 6 oz)   SpO2 100%   BMI 27.66 kg/m   HEMODYNAMICS:    VENTILATOR SETTINGS:    INTAKE / OUTPUT: I/O last 3 completed shifts: In: 7688  [P.O.:360; I.V.:5078; Other:2000; IV Piggyback:250] Out: 2525 [Urine:2525]  PHYSICAL EXAMINATION: General:  Elderly woman in no distress, lying in bed HEENT: OP clear, no lesions Neuro: Alert, awake, interacting appropriately. Oriented 2, moving all extremities CV: Regular, no murmur PULM: No accessory muscle use, clear lungs GI: Soft, nontender, bowel sounds positive Extremities: No edema, no deformity Skin: No rash   LABS:  BMET  Recent Labs Lab 09/18/16 1038 09/18/16 1356 09/19/16 0332  NA 135 135 138  K 3.4* 3.3* 3.2*  CL 104 105 113*  CO2 23 22 18*  BUN 45* 44* 31*  CREATININE 1.28* 1.25* 0.99  GLUCOSE 162* 160* 97    Electrolytes  Recent Labs Lab 09/18/16 1038 09/18/16 1356 09/19/16 0332  CALCIUM 8.0* 7.7* 7.3*    CBC  Recent Labs Lab 09/17/16 1222 09/18/16 0153 09/19/16 0332  WBC 36.6* 18.3* 21.0*  HGB 13.2 9.6* 8.8*  HCT 38.4 27.5* 25.9*  PLT 346 192 214    Coag's  Recent Labs Lab 09/17/16 1223  APTT 26  INR 1.37    Sepsis Markers  Recent Labs Lab 09/18/16 1356  LATICACIDVEN 2.4*    ABG No results for input(s): PHART, PCO2ART, PO2ART in the last 168 hours.  Liver Enzymes  Recent Labs Lab 09/17/16 1222  AST 29  ALT 21  ALKPHOS 95  BILITOT 1.3*  ALBUMIN 4.2    Cardiac Enzymes No results for input(s): TROPONINI, PROBNP in the last 168 hours.  Glucose  Recent Labs Lab 09/18/16 0423 09/18/16 0744 09/18/16 1152 09/18/16 1612 09/18/16 2127 09/19/16 0751  GLUCAP 134* 79 182* 163* 147* 78    Imaging No results found.   STUDIES:  UDS 4/5 >> negative  CT Head / Cervical Spine 4/5 >> no acute abnormality  ECHO 4/6 >>   CULTURES: UC 4/5 >> multiple species  ANTIBIOTICS: Cefepime 4/4 >>  Vanco 4/4 >>  SIGNIFICANT EVENTS: 4/04  Admit with AMS, s/p fall  4/06  PCCM consulted for hypotension   LINES/TUBES:   DISCUSSION: 83 year old female, known diabetic, lives behind her daughter's house, was  brought in after being found unresponsive with an elevated sugar. She was complaining of poor appetite, has not been eating for 3 days because of nausea, vomiting, feeling unwell. She was treated as a diabetic emergency  (CBG 700mg %, HCO3 15) with insulin, hydration. Her sugars improved quickly. The last 12 hours however, she was noted to be hypotensive likely related to hypovolemia and meds (She was hypertensive and had sinus tachycardia on admission and got prn lopressor).    ASSESSMENT / PLAN:  Hypotension Iikely related to hypovolemia, given history of poor by mouth intake for last few days. Suspect a component of sepsis in the setting of UTI. Also consider contribution of metoprolol Weaning phenylephrine to off as she can tolerate for goal systolic blood pressure greater than 90 Continue current maintenance IV fluids Goal defer central line placement and possible Home beta blocker on hold Agree with broad-spectrum antibiotics: Vancomycin and cefepime pending culture data Looks like she may need a repeat urine culture based on equivocal results from first sample Push by mouth intake as she can tolerate  Sinus tachycardia, atrial tachycardia.  Follow with current resuscitation Restart beta blocker after stabilized from this illness   AKI related to hypovolemia. Improved Continue maintenance IV fluids Follow urine output, BMP   Hyperglycemia, improved Agree with current sliding scale insulin amaryl & actos    CLL Followed by oncology. No intervention at this time.  Best practice  - Lovenox for DVT prophylaxis.    FAMILY  - Updates:  Dr. Murlean Iba updated patient, daughter, son-in-law, updated at bedside 4/6. Patient updated by RB on 4/7   Independent CC time 32 minutes   Baltazar Apo, MD, PhD 09/19/2016, 11:16 AM [facility] [phone] or if no answer [phone]

## 2016-09-19 NOTE — Progress Notes (Signed)
Pharmacy Antibiotic Note  Carrie Atkins is a 83 y.o. female admitted on 09/17/2016 with DKA, now concerned for sepsis.  Pharmacy has been consulted for vancomycin and cefepime dosing on 4/6  Plan: 1) due to improved SCr, change cefepime from 1g q24 to 1g q12 2) continue vancomycin 1g q24  Height: 5\' 1"  (154.9 cm) Weight: 146 lb 6 oz (66.4 kg) IBW/kg (Calculated) : 47.8  Temp (24hrs), Avg:98 F (36.7 C), Min:97.5 F (36.4 C), Max:98.2 F (36.8 C)   Recent Labs Lab 09/17/16 1222  09/18/16 0153 09/18/16 0402 09/18/16 1038 09/18/16 1356 09/19/16 0332  WBC 36.6*  --  18.3*  --   --   --  21.0*  CREATININE 1.92*  < > 1.27* 1.25* 1.28* 1.25* 0.99  LATICACIDVEN  --   --   --   --   --  2.4*  --   < > = values in this interval not displayed.  Estimated Creatinine Clearance: 38.8 mL/min (by C-G formula based on SCr of 0.99 mg/dL).    Allergies  Allergen Reactions  . Sulfonamide Derivatives Nausea And Vomiting  . Hydrocodone Nausea And Vomiting  . Sulfa Antibiotics Nausea And Vomiting    Antimicrobials this admission: 4/5 Ceftriaxone x 1 4/6 Cefepime >> 4/6 Vancomycin >>   Dose adjustments this admission:  Microbiology results: 4/5 UCx: multiple species, suggest recollection 4/5 MRSA PCR: neg 4/6 blood: pending   Thank you for allowing pharmacy to be a part of this patient's care.  Adrian Saran, PharmD, BCPS Pager [phone] 09/19/2016 10:40 AM

## 2016-09-19 NOTE — Progress Notes (Addendum)
Subjective:  She is pleasant and not currently complaining of shortness of breath or chest pain.  Somewhat confused.  Has been in sinus rhythm overnight.  Objective:  Vital Signs in the last 24 hours: BP (!) 94/35   Pulse (!) 55   Temp 98.2 F (36.8 C) (Oral)   Resp 19   Ht 5\' 1"  (1.549 m)   Wt 66.4 kg (146 lb 6 oz)   SpO2 96%   BMI 27.66 kg/m   Physical Exam: Pleasant elderly white female in no acute distress Lungs:  Clear Cardiac:  Regular rhythm, normal S1 and S2, no S3 Extremities:  No edema present  Intake/Output from previous day: 04/06 0701 - 04/07 0700 In: 6240.1 [I.V.:3990.1; IV Piggyback:250] Out: 1625 [Urine:1625]  Weight Filed Weights   09/17/16 1053  Weight: 66.4 kg (146 lb 6 oz)    Lab Results: Basic Metabolic Panel:  Recent Labs  09/18/16 1356 09/19/16 0332  NA 135 138  K 3.3* 3.2*  CL 105 113*  CO2 22 18*  GLUCOSE 160* 97  BUN 44* 31*  CREATININE 1.25* 0.99   CBC:  Recent Labs  09/17/16 1222 09/18/16 0153 09/19/16 0332  WBC 36.6* 18.3* 21.0*  NEUTROABS 22.7*  --   --   HGB 13.2 9.6* 8.8*  HCT 38.4 27.5* 25.9*  MCV 92.3 88.1 93.5  PLT 346 192 214   Cardiac Panel (last 3 results)  Recent Labs  09/17/16 1329 09/18/16 0402  CKTOTAL 500* 269*    Telemetry: Personally reviewed, patient currently in sinus rhythm.  Review of strips show intermittent episodes of some sinus tachycardia with pauses  Assessment/Plan:  1.  Patient currently in sinus rhythm with occasional PACs-not felt to be good candidate for anticoagulation due to falls-her echocardiogram showed an ejection fraction of around 60% with mild LVH 2.  Hypotension currently on epinephrine 3.  Acute renal failure resolved 4.  Hypokalemia 5.  Anemia  Recommendations:  Continue to treat for rhabdomyolysis and watch.     Kerry Hough  MD [facility]  09/19/2016, 8:09 AM

## 2016-09-20 DIAGNOSIS — E1142 Type 2 diabetes mellitus with diabetic polyneuropathy: Secondary | ICD-10-CM

## 2016-09-20 LAB — BLOOD CULTURE ID PANEL (REFLEXED)
ACINETOBACTER BAUMANNII: NOT DETECTED
Candida albicans: NOT DETECTED
Candida glabrata: NOT DETECTED
Candida krusei: NOT DETECTED
Candida parapsilosis: NOT DETECTED
Candida tropicalis: NOT DETECTED
ENTEROBACTER CLOACAE COMPLEX: NOT DETECTED
ENTEROBACTERIACEAE SPECIES: NOT DETECTED
ENTEROCOCCUS SPECIES: NOT DETECTED
ESCHERICHIA COLI: NOT DETECTED
Haemophilus influenzae: NOT DETECTED
Klebsiella oxytoca: NOT DETECTED
Klebsiella pneumoniae: NOT DETECTED
LISTERIA MONOCYTOGENES: NOT DETECTED
Methicillin resistance: DETECTED — AB
NEISSERIA MENINGITIDIS: NOT DETECTED
PROTEUS SPECIES: NOT DETECTED
PSEUDOMONAS AERUGINOSA: NOT DETECTED
STAPHYLOCOCCUS SPECIES: DETECTED — AB
STREPTOCOCCUS AGALACTIAE: NOT DETECTED
STREPTOCOCCUS PNEUMONIAE: NOT DETECTED
STREPTOCOCCUS SPECIES: NOT DETECTED
Serratia marcescens: NOT DETECTED
Staphylococcus aureus (BCID): NOT DETECTED
Streptococcus pyogenes: NOT DETECTED

## 2016-09-20 LAB — COMPREHENSIVE METABOLIC PANEL
ALBUMIN: 2.2 g/dL — AB (ref 3.5–5.0)
ALT: 19 U/L (ref 14–54)
AST: 23 U/L (ref 15–41)
Alkaline Phosphatase: 41 U/L (ref 38–126)
Anion gap: 5 (ref 5–15)
BUN: 15 mg/dL (ref 6–20)
CO2: 18 mmol/L — ABNORMAL LOW (ref 22–32)
CREATININE: 0.78 mg/dL (ref 0.44–1.00)
Calcium: 7.2 mg/dL — ABNORMAL LOW (ref 8.9–10.3)
Chloride: 117 mmol/L — ABNORMAL HIGH (ref 101–111)
GFR calc Af Amer: >60 mL/min (ref >60)
Glucose, Bld: 111 mg/dL — ABNORMAL HIGH (ref 65–99)
Potassium: 2.7 mmol/L — CL (ref 3.5–5.1)
SODIUM: 140 mmol/L (ref 135–145)
TOTAL PROTEIN: 4.6 g/dL — AB (ref 6.5–8.1)
Total Bilirubin: 0.6 mg/dL (ref 0.3–1.2)

## 2016-09-20 LAB — GLUCOSE, CAPILLARY
GLUCOSE-CAPILLARY: 112 mg/dL — AB (ref 65–99)
Glucose-Capillary: 233 mg/dL — ABNORMAL HIGH (ref 65–99)
Glucose-Capillary: 230 mg/dL — ABNORMAL HIGH (ref 65–99)
Glucose-Capillary: 167 mg/dL — ABNORMAL HIGH (ref 65–99)

## 2016-09-20 LAB — CBC
HCT: 22.9 % — ABNORMAL LOW (ref 36.0–46.0)
HEMOGLOBIN: 7.9 g/dL — AB (ref 12.0–15.0)
MCH: 31.2 pg (ref 26.0–34.0)
MCHC: 34.5 g/dL (ref 30.0–36.0)
MCV: 90.5 fL (ref 78.0–100.0)
PLATELETS: 146 10*3/uL — AB (ref 150–400)
RBC: 2.53 MIL/uL — AB (ref 3.87–5.11)
RDW: 13.4 % (ref 11.5–15.5)
WBC: 9.1 10*3/uL (ref 4.0–10.5)

## 2016-09-20 MED ORDER — ENOXAPARIN SODIUM 40 MG/0.4ML ~~LOC~~ SOLN
40.0000 mg | SUBCUTANEOUS | Status: DC
  Administered 2016-09-20 – 2016-09-22 (×3): 40 mg via SUBCUTANEOUS
  Filled 2016-09-20 (×3): qty 0.4

## 2016-09-20 MED ORDER — CARVEDILOL 3.125 MG PO TABS
3.1250 mg | ORAL_TABLET | Freq: Two times a day (BID) | ORAL | Status: DC
  Administered 2016-09-20 – 2016-09-23 (×7): 3.125 mg via ORAL
  Filled 2016-09-20 (×7): qty 1

## 2016-09-20 MED ORDER — POTASSIUM CHLORIDE CRYS ER 20 MEQ PO TBCR
40.0000 meq | EXTENDED_RELEASE_TABLET | ORAL | Status: AC
  Administered 2016-09-20 (×3): 40 meq via ORAL
  Filled 2016-09-20 (×3): qty 2

## 2016-09-20 MED ORDER — LIP MEDEX EX OINT
TOPICAL_OINTMENT | CUTANEOUS | Status: DC | PRN
  Administered 2016-09-20: 1 via TOPICAL
  Filled 2016-09-20: qty 7

## 2016-09-20 MED ORDER — SIMETHICONE 80 MG PO CHEW
80.0000 mg | CHEWABLE_TABLET | Freq: Four times a day (QID) | ORAL | Status: DC | PRN
  Administered 2016-09-20 – 2016-09-21 (×3): 80 mg via ORAL
  Filled 2016-09-20 (×3): qty 1

## 2016-09-20 NOTE — Progress Notes (Signed)
PULMONARY / CRITICAL CARE MEDICINE   Name: Carrie Atkins MRN: [id number] DOB: [birth year]    ADMISSION DATE:  09/17/2016 CONSULTATION DATE:  09/18/16  REFERRING MD:  Dr. Cruzita Lederer  CHIEF COMPLAINT:  Hypotension   HISTORY OF PRESENT ILLNESS:  83 y/o F who presented to [facility] on 4/5 via EMS after being called by her PCP reporting her glucose was >700 on labs the day prior to admit.    She has a medical history of DM, neuropathy, CLL, HTN and mild LV outflow tract obstruction (followed by Dr. Cathie Olden).  She lives in an apartment which is an addition to the daughters home.  The patient reported on admit that she fell at 0100 and slept on the floor until her daughter came to visit 0800.  Family reported on admit that they had been taking her food but she has not been eating.  They actually found the food in the trash.  They also report she has not been taking her medications as prescribed (found all of her medications in the weekly pill box).  She has had difficulty with walking / being unsteady.    She was hemodynamically stable on presentation.  The patient was admitted per [facility] for further evaluation.  EKG showed rate controlled AF on admit.  She was felt to have mild rhabdomyolysis on presentation, UTI, DKA and volume depletion.  Home lasix was held on admit.  She initially improved but on 4/6 she developed hypotension.  The patient has been treated with 2L NS 4/6 with improvement in blood pressure but it remains soft.  Home Coreg continued during admission for AF with RVR.   SUBJECTIVE:  Off pressors this morning Up to chair Tolerated breakfast but is having some gas and burping   VITAL SIGNS: BP (!) 139/43   Pulse (!) 49   Temp 98.3 F (36.8 C) (Oral)   Resp (!) 27   Ht 5\' 1"  (1.549 m)   Wt 66.4 kg (146 lb 6 oz)   SpO2 99%   BMI 27.66 kg/m   HEMODYNAMICS:    VENTILATOR SETTINGS:    INTAKE / OUTPUT: I/O last 3 completed shifts: In: 6456.8 [P.O.:1020; I.V.:5136.8; IV  Piggyback:300] Out: 3325 [Urine:3325]  PHYSICAL EXAMINATION: General:  Up to chair, comfortable HEENT: No oral lesions Neuro: Awake, alert, interacting appropriately, no focal deficits CV: Regular, no murmur PULM: Lungs clear, comfortable rest for a pattern GI: Soft, benign Extremities: No deformity, no edema Skin: No rashes   LABS:  BMET  Recent Labs Lab 09/18/16 1356 09/19/16 0332 09/20/16 0620  NA 135 138 140  K 3.3* 3.2* 2.7*  CL 105 113* 117*  CO2 22 18* 18*  BUN 44* 31* 15  CREATININE 1.25* 0.99 0.78  GLUCOSE 160* 97 111*    Electrolytes  Recent Labs Lab 09/18/16 1356 09/19/16 0332 09/20/16 0620  CALCIUM 7.7* 7.3* 7.2*    CBC  Recent Labs Lab 09/18/16 0153 09/19/16 0332 09/20/16 0342  WBC 18.3* 21.0* 9.1  HGB 9.6* 8.8* 7.9*  HCT 27.5* 25.9* 22.9*  PLT 192 214 146*    Coag's  Recent Labs Lab 09/17/16 1223  APTT 26  INR 1.37    Sepsis Markers  Recent Labs Lab 09/18/16 1356  LATICACIDVEN 2.4*    ABG No results for input(s): PHART, PCO2ART, PO2ART in the last 168 hours.  Liver Enzymes  Recent Labs Lab 09/17/16 1222 09/20/16 0620  AST 29 23  ALT 21 19  ALKPHOS 95 41  BILITOT 1.3* 0.6  ALBUMIN 4.2 2.2*    Cardiac Enzymes No results for input(s): TROPONINI, PROBNP in the last 168 hours.  Glucose  Recent Labs Lab 09/18/16 2127 09/19/16 0751 09/19/16 1150 09/19/16 1548 09/19/16 2130 09/20/16 0755  GLUCAP 147* 78 233* 251* 178* 112*    Imaging No results found.   STUDIES:  UDS 4/5 >> negative  CT Head / Cervical Spine 4/5 >> no acute abnormality  ECHO 4/6 >>   CULTURES: UC 4/5 >> multiple species  ANTIBIOTICS: Cefepime 4/4 >>  Vanco 4/4 >>  SIGNIFICANT EVENTS: 4/04  Admit with AMS, s/p fall  4/06  PCCM consulted for hypotension   LINES/TUBES:   DISCUSSION: 83 year old female, known diabetic, lives behind her daughter's house, was brought in after being found unresponsive with an elevated sugar.  She was complaining of poor appetite, has not been eating for 3 days because of nausea, vomiting, feeling unwell. She was treated as a diabetic emergency  (CBG 700mg %, HCO3 15) with insulin, hydration. Her sugars improved quickly. The last 12 hours however, she was noted to be hypotensive likely related to hypovolemia and meds (She was hypertensive and had sinus tachycardia on admission and got prn lopressor).    ASSESSMENT / PLAN:  Hypotension Iikely related to hypovolemia, given history of poor by mouth intake for last few days. Suspect a component of sepsis in the setting of UTI. Also consider contribution of metoprolol Hemodynamically improved, phenylephrine has been weaned off Maintenance IV fluids Did not require central line placement Broad-spectrum anabiotic's pending culture data  Sinus tachycardia, atrial tachycardia.  Improved   AKI related to hypovolemia. Improved Hypokalemia Follow urine output, BMP Replace potassium  Hyperglycemia, improved Sliding scale insulin Continue Amaryl and Actos  CLL Followed by oncology. No intervention at this time.  Indigestion/gas Add simethicone  Best practice  - Lovenox for DVT prophylaxis.    FAMILY  - Updates:  Dr. Murlean Iba updated patient, daughter, son-in-law, updated at bedside 4/6. Patient updated by RB on 4/7 and 4/8   PCCM will sign off. Please call if we can assist   Baltazar Apo, MD, PhD 09/20/2016, 11:11 AM [facility] [phone] or if no answer [phone]

## 2016-09-20 NOTE — Progress Notes (Signed)
Subjective:  She is pleasant and not currently complaining of shortness of breath or chest pain.  Knows she is at [facility].    Objective:  Vital Signs in the last 24 hours: BP (!) 139/43   Pulse (!) 49   Temp 97.8 F (36.6 C) (Axillary)   Resp (!) 27   Ht 5\' 1"  (1.549 m)   Wt 66.4 kg (146 lb 6 oz)   SpO2 99%   BMI 27.66 kg/m   Physical Exam: Pleasant elderly white female in no acute distress Lungs:  Clear Cardiac:  Regular rhythm, normal S1 and S2, no S3 Extremities:  No edema present  Intake/Output from previous day: 04/07 0701 - 04/08 0700 In: 4235.8 [P.O.:1020; I.V.:2915.8; IV Piggyback:300] Out: 2300 [Urine:2300]  Weight Filed Weights   09/17/16 1053  Weight: 66.4 kg (146 lb 6 oz)    Lab Results: Basic Metabolic Panel:  Recent Labs  09/18/16 1356 09/19/16 0332  NA 135 138  K 3.3* 3.2*  CL 105 113*  CO2 22 18*  GLUCOSE 160* 97  BUN 44* 31*  CREATININE 1.25* 0.99   CBC:  Recent Labs  09/17/16 1222  09/19/16 0332 09/20/16 0342  WBC 36.6*  < > 21.0* 9.1  NEUTROABS 22.7*  --   --   --   HGB 13.2  < > 8.8* 7.9*  HCT 38.4  < > 25.9* 22.9*  MCV 92.3  < > 93.5 90.5  PLT 346  < > 214 146*  < > = values in this interval not displayed. Cardiac Panel (last 3 results)  Recent Labs  09/17/16 1329 09/18/16 0402  CKTOTAL 500* 269*    Telemetry: Personally reviewed, patient currently in sinus rhythm.  Review of strips show intermittent episodesParoxysmal atrial fibrillation that is nonsustained.   Assessment/Plan:  1.  Patient currently in sinus rhythm with some episodes of paroxysmal atrial fibrillation-currently not on any medicines for this 2.  Hypotension improved  3.  Acute renal failure resolved 4.  Hypokalemia 5.  Anemia  Recommendations: She is still having some brief episodes of paroxysmal atrial fibrillation but is improving from her DKA as well as presumed sepsis.  Not good candidate for anticoagulation   I would try to restart  carvedilol at a lower dose and observe for further arrhythmias or hypotension.       Kerry Hough  MD [facility]  09/20/2016, 7:44 AM

## 2016-09-20 NOTE — Progress Notes (Signed)
PROGRESS NOTE  Carrie Atkins JJO:[id number] DOB: 05-05-1934 DOA: 09/17/2016 PCP: [name]., MD   LOS: 2 days   Brief Narrative: 83 y.o. female with medical history significant or CLL, diabetes mellitus with neuropathy, hypertension and mild LV outflow tract obstruction follows with [name], presents with elevated blood sugar in the setting of not taking her own medications at home.  She cannot explain why she stopped her medications at home.  She also has had poor p.o. intake at home.  She was admitted to stepdown due to requirements for insulin infusion, she was otherwise stable on admission, however on 4/6 in early afternoon she started becoming more and more hypotensive.  The CCM was consulted, her antibiotic regimen was broadened however eventually needed to be started on pressors.  She has improved, she is off vasopressors and her blood pressures have remained stable.  Assessment & Plan: Principal Problem:   DKA (diabetic ketoacidoses) ([place]) Active Problems:   HTN (hypertension)   CLL (chronic lymphocytic leukemia) (HCC)   Diabetic polyneuropathy associated with type 2 diabetes mellitus (HCC)   Acute encephalopathy   Acute cystitis without hematuria   AKI (acute kidney injury) ([place])   New onset a-fib (HCC)   Hypotension  Shock -Unclear etiology, questionable dehydration versus septic.  She required pressors 4/6-4/7, now off -Cortisol was appropriately elevated at 21 -Continue broad-spectrum antibiotics with vancomycin and cefepime, urine culture resulted with multiple species, blood cultures with 1 out of 2 bottles of gram-positive's, which appears to be methicillin-resistant staph species, not staph aureus -Discontinue fluids today, she is developing some lower extremity trace edema  DKA (diabetic ketoacidoses) -On admission insulin infusion, now gap closed, she was placed on sliding scale and CBGs are better controlled this morning, continue current modified diet and her  home medications -Hemoglobin A1c is 12.6 -according to the daughter when the patient left the rehab facility last year, she was given Insulin at home and became confused. They attributed the confusion to the insulin and then stopped giving it. She had previously been receiving insulin at the hospital and subsequently at rehab.   A-fib with RVR -new diagnosis -CHA2DS2-VASc Score at least 5 -ECHO on 4/6 showed ejection fraction of 60-65%, mild LVH and grade 1 diastolic dysfunction -TSH, free T4 normal -Cardiology following, not a good candidate for anticoagulation -Given resolution of her shock and normal blood pressure, will resume Coreg at a lower dose  Acute kidney injury -Likely in the setting of DKA, dehydration, rhabdomyolysis, resolved  Fall with mild Rhabdomyolysis -PT eval recommended SNF  UTI, leukocytosis with left shift -Antibiotics broadened 4/6 as above  Acute encephalopathy -due to possible underlying infectious process versus DKA, improving -CT scan and MRI of the brain negative  HTN (hypertension) -now on pressors  CLL (chronic lymphocytic leukemia)    DVT prophylaxis: Lovenox Code Status: Full code Family Communication: no family at bedside, d/w daughter over the phone 4/7 Disposition Plan: remain in SDU today  Consultants:   Cardiology   PCCM  Procedures:   2D echo Study Conclusions - Left ventricle: The cavity size was normal. Wall thickness was increased in a pattern of mild LVH. Systolic function was normal. The estimated ejection fraction was in the range of 60% to 65%. Wall motion was normal; there were no regional wall motion abnormalities. Doppler parameters are consistent with abnormal left ventricular relaxation (grade 1 diastolic dysfunction). Left atrium: The atrium was mildly dilated. Atrial septum: No defect or patent foramen ovale was identified. Pulmonary arteries:  PA peak pressure: 37 mm Hg (S).  Antimicrobials:  Ceftriaxone 4/5  >> 4/6  Vancomycin 4/6 >>   Cefepime 4/6 >>  Subjective: -She is more alert today, has no complaints, denies any chest pain, has no shortness of breath.   Objective: Vitals:   09/20/16 0400 09/20/16 0500 09/20/16 0600 09/20/16 0700  BP: 116/75 (!) 110/32 (!) 102/33 (!) 139/43  Pulse: (!) 56 (!) 58 (!) 48 (!) 49  Resp: 19 (!) 26 (!) 28 (!) 27  Temp:    98.3 F (36.8 C)  TempSrc:    Oral  SpO2: 100% 98% 98% 99%  Weight:      Height:        Intake/Output Summary (Last 24 hours) at 09/20/16 1010 Last data filed at 09/20/16 0600  Gross per 24 hour  Intake          3122.08 ml  Output             2300 ml  Net           822.08 ml   Filed Weights   09/17/16 1053  Weight: 66.4 kg (146 lb 6 oz)    Examination: Constitutional: NAD, pale appearing Vitals:   09/20/16 0400 09/20/16 0500 09/20/16 0600 09/20/16 0700  BP: 116/75 (!) 110/32 (!) 102/33 (!) 139/43  Pulse: (!) 56 (!) 58 (!) 48 (!) 49  Resp: 19 (!) 26 (!) 28 (!) 27  Temp:    98.3 F (36.8 C)  TempSrc:    Oral  SpO2: 100% 98% 98% 99%  Weight:      Height:       Eyes: lids and conjunctivae normal ENMT: Mucous membranes are moist. No oropharyngeal exudates Neck: normal, supple, no masses, no thyromegaly Respiratory: clear to auscultation bilaterally, no wheezing, no crackles. Normal respiratory effort.  Cardiovascular: Regular, no M, No LE edema. 2+ pedal pulses.  Abdomen: no tenderness. Bowel sounds positive.  Neurologic: CN 2-12 grossly intact. Strength 5-/5 RLE and 5/5 in rest   Data Reviewed: I have personally reviewed following labs and imaging studies  CBC:  Recent Labs Lab 09/17/16 1222 09/18/16 0153 09/19/16 0332 09/20/16 0342  WBC 36.6* 18.3* 21.0* 9.1  NEUTROABS 22.7*  --   --   --   HGB 13.2 9.6* 8.8* 7.9*  HCT 38.4 27.5* 25.9* 22.9*  MCV 92.3 88.1 93.5 90.5  PLT 346 192 214 440*   Basic Metabolic Panel:  Recent Labs Lab 09/18/16 0402 09/18/16 1038 09/18/16 1356 09/19/16 0332  09/20/16 0620  NA 135 135 135 138 140  K 3.5 3.4* 3.3* 3.2* 2.7*  CL 103 104 105 113* 117*  CO2 24 23 22  18* 18*  GLUCOSE 105* 162* 160* 97 111*  BUN 48* 45* 44* 31* 15  CREATININE 1.25* 1.28* 1.25* 0.99 0.78  CALCIUM 8.0* 8.0* 7.7* 7.3* 7.2*   GFR: Estimated Creatinine Clearance: 48.1 mL/min (by C-G formula based on SCr of 0.78 mg/dL). Liver Function Tests:  Recent Labs Lab 09/17/16 1222 09/20/16 0620  AST 29 23  ALT 21 19  ALKPHOS 95 41  BILITOT 1.3* 0.6  PROT 8.0 4.6*  ALBUMIN 4.2 2.2*   No results for input(s): LIPASE, AMYLASE in the last 168 hours. No results for input(s): AMMONIA in the last 168 hours. Coagulation Profile:  Recent Labs Lab 09/17/16 1223  INR 1.37   Cardiac Enzymes:  Recent Labs Lab 09/17/16 1329 09/18/16 0402  CKTOTAL 500* 269*   BNP (last 3 results) No results for  input(s): PROBNP in the last 8760 hours. HbA1C:  Recent Labs  09/18/16 1130  HGBA1C 12.6*   CBG:  Recent Labs Lab 09/19/16 0751 09/19/16 1150 09/19/16 1548 09/19/16 2130 09/20/16 0755  GLUCAP 78 233* 251* 178* 112*   Lipid Profile: No results for input(s): CHOL, HDL, LDLCALC, TRIG, CHOLHDL, LDLDIRECT in the last 72 hours. Thyroid Function Tests:  Recent Labs  09/17/16 1913  TSH 1.285  FREET4 1.02   Anemia Panel: No results for input(s): VITAMINB12, FOLATE, FERRITIN, TIBC, IRON, RETICCTPCT in the last 72 hours. Urine analysis:    Component Value Date/Time   COLORURINE STRAW (A) 09/17/2016 1222   APPEARANCEUR CLEAR 09/17/2016 1222   LABSPEC 1.007 09/17/2016 1222   PHURINE 5.0 09/17/2016 1222   GLUCOSEU >=500 (A) 09/17/2016 1222   HGBUR MODERATE (A) 09/17/2016 1222   BILIRUBINUR NEGATIVE 09/17/2016 1222   KETONESUR 5 (A) 09/17/2016 1222   PROTEINUR NEGATIVE 09/17/2016 1222   NITRITE POSITIVE (A) 09/17/2016 1222   LEUKOCYTESUR SMALL (A) 09/17/2016 1222   Sepsis Labs: Invalid input(s): PROCALCITONIN, LACTICIDVEN  Recent Results (from the past 240  hour(s))  Urine culture     Status: Abnormal   Collection Time: 09/17/16 12:23 PM  Result Value Ref Range Status   Specimen Description URINE, RANDOM  Final   Special Requests NONE  Final   Culture MULTIPLE SPECIES PRESENT, SUGGEST RECOLLECTION (A)  Final   Report Status 09/18/2016 FINAL  Final  MRSA PCR Screening     Status: None   Collection Time: 09/17/16  5:16 PM  Result Value Ref Range Status   MRSA by PCR NEGATIVE NEGATIVE Final    Comment:        The GeneXpert MRSA Assay (FDA approved for NASAL specimens only), is one component of a comprehensive MRSA colonization surveillance program. It is not intended to diagnose MRSA infection nor to guide or monitor treatment for MRSA infections.   Culture, blood (Routine X 2) w Reflex to ID Panel     Status: None (Preliminary result)   Collection Time: 09/18/16  6:42 PM  Result Value Ref Range Status   Specimen Description BLOOD RIGHT HAND  Final   Special Requests IN PEDIATRIC BOTTLE Blood Culture adequate volume  Final   Culture  Setup Time   Final    GRAM POSITIVE COCCI IN CLUSTERS IN PEDIATRIC BOTTLE CRITICAL RESULT CALLED TO, READ BACK BY AND VERIFIED WITH: D. WOFFORD, PHARM AT [facility], 09/19/16 AT 2028 BY J FUDESCO    Culture   Final    GRAM POSITIVE COCCI CULTURE REINCUBATED FOR BETTER GROWTH Performed at [facility], [place] [address], [place], [place] [address]    Report Status PENDING  Incomplete  Blood Culture ID Panel (Reflexed)     Status: Abnormal   Collection Time: 09/18/16  6:42 PM  Result Value Ref Range Status   Enterococcus species NOT DETECTED NOT DETECTED Final   Listeria monocytogenes NOT DETECTED NOT DETECTED Final   Staphylococcus species DETECTED (A) NOT DETECTED Final    Comment: Methicillin (oxacillin) resistant coagulase negative staphylococcus. Possible blood culture contaminant (unless isolated from more than one blood culture draw or clinical case suggests pathogenicity). No antibiotic treatment  is indicated for blood  culture contaminants. CRITICAL RESULT CALLED TO, READ BACK BY AND VERIFIED WITH: D WOFFORD,PHARMD AT 2028 09/19/16 BY J FUDESCO    Staphylococcus aureus NOT DETECTED NOT DETECTED Final   Methicillin resistance DETECTED (A) NOT DETECTED Final    Comment: CRITICAL RESULT CALLED TO,  READ BACK BY AND VERIFIED WITH: D WOFFORD, PHARM AT [facility], 09/19/16 AT 2028 BY J FUDESCO    Streptococcus species NOT DETECTED NOT DETECTED Final   Streptococcus agalactiae NOT DETECTED NOT DETECTED Final   Streptococcus pneumoniae NOT DETECTED NOT DETECTED Final   Streptococcus pyogenes NOT DETECTED NOT DETECTED Final   Acinetobacter baumannii NOT DETECTED NOT DETECTED Final   Enterobacteriaceae species NOT DETECTED NOT DETECTED Final   Enterobacter cloacae complex NOT DETECTED NOT DETECTED Final   Escherichia coli NOT DETECTED NOT DETECTED Final   Klebsiella oxytoca NOT DETECTED NOT DETECTED Final   Klebsiella pneumoniae NOT DETECTED NOT DETECTED Final   Proteus species NOT DETECTED NOT DETECTED Final   Serratia marcescens NOT DETECTED NOT DETECTED Final   Haemophilus influenzae NOT DETECTED NOT DETECTED Final   Neisseria meningitidis NOT DETECTED NOT DETECTED Final   Pseudomonas aeruginosa NOT DETECTED NOT DETECTED Final   Candida albicans NOT DETECTED NOT DETECTED Final   Candida glabrata NOT DETECTED NOT DETECTED Final   Candida krusei NOT DETECTED NOT DETECTED Final   Candida parapsilosis NOT DETECTED NOT DETECTED Final   Candida tropicalis NOT DETECTED NOT DETECTED Final    Comment: Performed at [facility], [place] [address] [place], [place] [address]  Culture, blood (Routine X 2) w Reflex to ID Panel     Status: None (Preliminary result)   Collection Time: 09/18/16  7:51 PM  Result Value Ref Range Status   Specimen Description BLOOD RIGHT HAND  Final   Special Requests IN PEDIATRIC BOTTLE Blood Culture adequate volume  Final   Culture   Final    NO GROWTH < 24  HOURS Performed at [facility], [place] [address] [place], [place] [address]    Report Status PENDING  Incomplete      Radiology Studies: No results found.   Scheduled Meds: . aspirin EC  81 mg Oral Daily  . carvedilol  3.125 mg Oral BID WC  . ceFEPime (MAXIPIME) IV  1 g Intravenous Q12H  . enoxaparin (LOVENOX) injection  40 mg Subcutaneous Q24H  . famotidine  20 mg Oral Daily  . ferrous sulfate  325 mg Oral Q breakfast  . glimepiride  8 mg Oral Q breakfast  . insulin aspart  0-15 Units Subcutaneous TID WC  . mouth rinse  15 mL Mouth Rinse BID  . multivitamin with minerals  1 tablet Oral Daily  . pioglitazone  15 mg Oral Daily  . potassium chloride  40 mEq Oral Q2H  . protein supplement shake  11 oz Oral BID BM  . senna  1 tablet Oral BID  . sodium chloride flush  3 mL Intravenous Q12H  . vancomycin  1,000 mg Intravenous Q24H   Continuous Infusions: . phenylephrine (NEO-SYNEPHRINE) Adult infusion Stopped (09/19/16 1343)    Marzetta Board, MD, PhD [facility] Pager [phone]  If 7PM-7AM, please contact night-coverage [email or web address] Password [email or web address] 09/20/2016, 10:10 AM

## 2016-09-21 DIAGNOSIS — I480 Paroxysmal atrial fibrillation: Secondary | ICD-10-CM

## 2016-09-21 DIAGNOSIS — A499 Bacterial infection, unspecified: Secondary | ICD-10-CM

## 2016-09-21 DIAGNOSIS — Z1612 Extended spectrum beta lactamase (ESBL) resistance: Secondary | ICD-10-CM

## 2016-09-21 LAB — BASIC METABOLIC PANEL
Anion gap: 7 (ref 5–15)
BUN: 13 mg/dL (ref 6–20)
CHLORIDE: 114 mmol/L — AB (ref 101–111)
CO2: 17 mmol/L — ABNORMAL LOW (ref 22–32)
CREATININE: 0.85 mg/dL (ref 0.44–1.00)
Calcium: 7.9 mg/dL — ABNORMAL LOW (ref 8.9–10.3)
GFR calc Af Amer: >60 mL/min (ref >60)
GLUCOSE: 196 mg/dL — AB (ref 65–99)
POTASSIUM: 3.9 mmol/L (ref 3.5–5.1)
SODIUM: 138 mmol/L (ref 135–145)

## 2016-09-21 LAB — CBC
HEMATOCRIT: 29.6 % — AB (ref 36.0–46.0)
Hemoglobin: 9.7 g/dL — ABNORMAL LOW (ref 12.0–15.0)
MCH: 30 pg (ref 26.0–34.0)
MCHC: 32.8 g/dL (ref 30.0–36.0)
MCV: 91.6 fL (ref 78.0–100.0)
Platelets: 151 10*3/uL (ref 150–400)
RBC: 3.23 MIL/uL — ABNORMAL LOW (ref 3.87–5.11)
RDW: 13.6 % (ref 11.5–15.5)
WBC: 10.3 10*3/uL (ref 4.0–10.5)

## 2016-09-21 LAB — CULTURE, BLOOD (ROUTINE X 2): Special Requests: ADEQUATE

## 2016-09-21 LAB — URINE CULTURE

## 2016-09-21 LAB — GLUCOSE, CAPILLARY
GLUCOSE-CAPILLARY: 304 mg/dL — AB (ref 65–99)
GLUCOSE-CAPILLARY: 207 mg/dL — AB (ref 65–99)
Glucose-Capillary: 195 mg/dL — ABNORMAL HIGH (ref 65–99)
Glucose-Capillary: 205 mg/dL — ABNORMAL HIGH (ref 65–99)

## 2016-09-21 MED ORDER — CALCIUM CARBONATE ANTACID 500 MG PO CHEW
1.0000 | CHEWABLE_TABLET | Freq: Three times a day (TID) | ORAL | Status: DC | PRN
  Administered 2016-09-21 – 2016-09-22 (×4): 200 mg via ORAL
  Filled 2016-09-21 (×4): qty 1

## 2016-09-21 MED ORDER — SODIUM CHLORIDE 0.9 % IV SOLN
1.0000 g | Freq: Two times a day (BID) | INTRAVENOUS | Status: DC
  Administered 2016-09-21 – 2016-09-23 (×5): 1 g via INTRAVENOUS
  Filled 2016-09-21 (×6): qty 1

## 2016-09-21 MED ORDER — INSULIN GLARGINE 100 UNIT/ML ~~LOC~~ SOLN
8.0000 [IU] | Freq: Every day | SUBCUTANEOUS | Status: DC
  Administered 2016-09-21 – 2016-09-22 (×2): 8 [IU] via SUBCUTANEOUS
  Filled 2016-09-21 (×3): qty 0.08

## 2016-09-21 NOTE — Progress Notes (Addendum)
Pharmacy Antibiotic Note  Carrie Atkins is a 83 y.o. female admitted on 09/17/2016 with DKA, now concerned for sepsis.  Pharmacy has been consulted for vancomycin and cefepime dosing on 4/6.  The Urine cx from 4/7 growing 10K of ESBL e. Coli.  This culture was collected after 2 days of antibiotic therapy, ? If selected out resistant organism and if truly reflects pathogenic bacteria.    Day #3 antibiotic therapy  WBC improving  Plan:  Meropenem 1gm IV q12h  UA at admission did not reveal any pyuria and ESBL e. Coli grew after 2-days of antibiotics, so may have selected out organisms in culture but may not represent true pathogen.Patient appears to be improving w/o carbapenem therapy and it is not clear sepsis is cause of symptoms, consider stopping antibiotics  Height: 5\' 1"  (154.9 cm) Weight: 146 lb 6 oz (66.4 kg) IBW/kg (Calculated) : 47.8  Temp (24hrs), Avg:98.2 F (36.8 C), Min:97.6 F (36.4 C), Max:98.9 F (37.2 C)   Recent Labs Lab 09/17/16 1222  09/18/16 0153  09/18/16 1038 09/18/16 1356 09/19/16 0332 09/20/16 0342 09/20/16 0620 09/21/16 0339  WBC 36.6*  --  18.3*  --   --   --  21.0* 9.1  --  10.3  CREATININE 1.92*  < > 1.27*  < > 1.28* 1.25* 0.99  --  0.78 0.85  LATICACIDVEN  --   --   --   --   --  2.4*  --   --   --   --   < > = values in this interval not displayed.  Estimated Creatinine Clearance: 45.2 mL/min (by C-G formula based on SCr of 0.85 mg/dL).    Allergies  Allergen Reactions  . Sulfonamide Derivatives Nausea And Vomiting  . Hydrocodone Nausea And Vomiting  . Sulfa Antibiotics Nausea And Vomiting    Antimicrobials this admission: 4/5 Ceftriaxone x 1 4/6 Cefepime >>4/9 4/6 Vancomycin >> 4/9 4/9 meropenem >>   Dose adjustments this admission: 4/7: due to improved SCr, change cefepime from 1g q24 to 1g q12   Microbiology results: 4/5 UCx: multiple species, suggest recollection 4/5 MRSA PCR: neg 4/6 blood: 1/2 GPC - CNS (likely  contaminant) 4/7 UCx: ESBL E. Coli 10K (collected after start of abx). S to imi, pip/tazo, nitrofurantoin  Thank you for allowing pharmacy to be a part of this patient's care.  Doreene Eland, PharmD, BCPS.   Pager: [phone] 09/21/2016 11:16 AM

## 2016-09-21 NOTE — Clinical Social Work Note (Signed)
Clinical Social Work Assessment  Patient Details  Name: Carrie Atkins MRN: [id number] Date of Birth: [birth year]  Date of referral:  09/21/16               Reason for consult:  Facility Placement                Permission sought to share information with:  Facility Sport and exercise psychologist, Family Supports Permission granted to share information::  Yes, Verbal Permission Granted  Name::     Hedy Jacob  Agency::     Relationship::  Daughter  Contact Information:  [phone]  Housing/Transportation Living arrangements for the past 2 months:  [place] of Information:  Patient Patient Interpreter Needed:  None Criminal Activity/Legal Involvement Pertinent to Current Situation/Hospitalization:    Significant Relationships:  Adult Children (Daughter Hedy Jacob [phone]) Lives with:  Self Do you feel safe going [facility] to the place where you live?  Yes Need for family participation in patient care:  No (Coment)  Care giving concerns:  PT recommended SNF for short term rehab.    Social Worker assessment / plan:  CSW spoke with patient at bedside, patient alert and orientedx4. Patient reports that she resides alone directly behind her daughter. Patient reports that her daughter checks on her daily and that she has a good neighbor that helps her sometimes. CSW and patient discussed PT recommendation for SNF for rehab, patient agreeable. Patient reports that she has been to [facility] in July 2017 for 3 or 4 weeks for rehab. Patient reported that she has a good experience. Patient wants to go [facility] to [facility] if possible, notes its close to her home. CSW will complete FL2, follow up with [facility] and follow patient to assist with discharge planning.   Employment status:  Retired Nurse, adult PT Recommendations:  [place] / Referral to community resources:  [place]  Patient/Family's Response to care:   Patient agreeable to SNF, preferably [facility].  Patient/Family's Understanding of and Emotional Response to Diagnosis, Current Treatment, and Prognosis:  Patient verbalized understanding of diagnosis and recommendation for SNF for ST rehab. Patient presented hopeful noting her goal is to return home after ST rehab.   Emotional Assessment Appearance:  Appears stated age Attitude/Demeanor/Rapport:  Other (Cooperative ) Affect (typically observed):  Calm, Pleasant Orientation:  Oriented to Self, Oriented to Place, Oriented to  Time, Oriented to Situation Alcohol / Substance use:  Not Applicable Psych involvement (Current and /or in the community):  No (Comment)  Discharge Needs  Concerns to be addressed:  No discharge needs identified Readmission within the last 30 days:  No Current discharge risk:  None Barriers to Discharge:  No Barriers Identified   Burnis Medin, LCSW 09/21/2016, 3:25 PM

## 2016-09-21 NOTE — Progress Notes (Signed)
Pt daughter notified of transfer to 1444.

## 2016-09-21 NOTE — NC FL2 (Signed)
[place] LEVEL OF CARE SCREENING TOOL     IDENTIFICATION  Patient Name: Carrie Atkins Birthdate: August 24, 1933 Sex: female Admission Date (Current Location): 09/17/2016  [facility] and [place] Number:  [place] and Address:  [facility],  [place] [address], [place]      Provider Number: [id number]  Attending Physician Name and Address:  Caren Griffins, MD  Relative Name and Phone Number:       Current Level of Care: Hospital Recommended Level of Care: [place] Prior Approval Number:    Date Approved/Denied:   PASRR Number: [id number]  Discharge Plan: SNF    Current Diagnoses: Patient Active Problem List   Diagnosis Date Noted  . Hypotension   . Paroxysmal atrial fibrillation ([place]) 09/17/2016  . E. coli UTI   . Leukocytosis   . AKI (acute kidney injury) ([place])   . Acute cystitis without hematuria   . Sepsis ([place])   . Hyperglycemia   . Acute encephalopathy 12/31/2015  . DKA (diabetic ketoacidoses) ([place]) 12/31/2015  . Seizure ([place])   . Spinal stenosis, lumbar region, with neurogenic claudication 01/30/2015  . Cervical myelopathy ([place]) 01/30/2015  . Gait difficulty 10/24/2014  . Hyperreflexia 10/24/2014  . Weakness of both lower extremities 10/24/2014  . Diabetic polyneuropathy associated with type 2 diabetes mellitus ([place]) 10/24/2014  . Dyspnea 07/14/2013  . CLL (chronic lymphocytic leukemia) ([place]) 06/05/2013  . HTN (hypertension) 02/03/2011  . Hyperlipidemia 02/03/2011    Orientation RESPIRATION BLADDER Height & Weight     Self, Time, Situation, Place  Normal Incontinent Weight: 146 lb 6 oz (66.4 kg) Height:  5\' 1"  (154.9 cm)  BEHAVIORAL SYMPTOMS/MOOD NEUROLOGICAL BOWEL NUTRITION STATUS      Incontinent Diet (Carb Modified )  AMBULATORY STATUS COMMUNICATION OF NEEDS Skin   Extensive Assist Verbally Normal                       Personal Care Assistance Level of Assistance  Bathing,  Feeding, Dressing Bathing Assistance: Limited assistance Feeding assistance: Independent Dressing Assistance: Limited assistance     Functional Limitations Info             SPECIAL CARE FACTORS FREQUENCY  PT (By licensed PT), OT (By licensed OT)     PT Frequency: 5x/week OT Frequency: 5x/week            Contractures Contractures Info: Not present    Additional Factors Info  Code Status, Allergies Code Status Info: Full Code  Allergies Info: Sulfonamide Derivatives; Hydrocodone; Sulfa Antibiotics;           Current Medications (09/21/2016):  This is the current hospital active medication list Current Facility-Administered Medications  Medication Dose Route Frequency Provider Last Rate Last Dose  . acetaminophen (TYLENOL) tablet 650 mg  650 mg Oral Q6H PRN Debbe Odea, MD   650 mg at 09/19/16 1009   Or  . acetaminophen (TYLENOL) suppository 650 mg  650 mg Rectal Q6H PRN Debbe Odea, MD      . aspirin EC tablet 81 mg  81 mg Oral Daily Debbe Odea, MD   81 mg at 09/21/16 0907  . calcium carbonate (TUMS - dosed in mg elemental calcium) chewable tablet 200 mg of elemental calcium  1 tablet Oral TID PRN Caren Griffins, MD   200 mg of elemental calcium at 09/21/16 1457  . carvedilol (COREG) tablet 3.125 mg  3.125 mg Oral BID WC Costin Karlyne Greenspan, MD  3.125 mg at 09/21/16 0756  . enoxaparin (LOVENOX) injection 40 mg  40 mg Subcutaneous Q24H Caren Griffins, MD   40 mg at 09/20/16 2009  . famotidine (PEPCID) tablet 20 mg  20 mg Oral Daily Caren Griffins, MD   20 mg at 09/21/16 0907  . ferrous sulfate tablet 325 mg  325 mg Oral Q breakfast Debbe Odea, MD   325 mg at 09/21/16 0755  . glimepiride (AMARYL) tablet 8 mg  8 mg Oral Q breakfast Rhetta Mura Schorr, NP   8 mg at 09/21/16 0755  . insulin aspart (novoLOG) injection 0-15 Units  0-15 Units Subcutaneous TID WC Jeryl Columbia, NP   11 Units at 09/21/16 1255  . insulin glargine (LANTUS) injection 8 Units  8 Units  Subcutaneous QHS Costin Karlyne Greenspan, MD      . lip balm (CARMEX) ointment   Topical PRN Caren Griffins, MD   1 application at 44/01/02 1832  . meropenem (MERREM) 1 g in sodium chloride 0.9 % 100 mL IVPB  1 g Intravenous Q12H Berton Mount, RPH   1 g at 09/21/16 1255  . multivitamin with minerals tablet 1 tablet  1 tablet Oral Daily Costin Karlyne Greenspan, MD   1 tablet at 09/21/16 0907  . ondansetron (ZOFRAN) tablet 4 mg  4 mg Oral Q6H PRN Debbe Odea, MD       Or  . ondansetron (ZOFRAN) injection 4 mg  4 mg Intravenous Q6H PRN Debbe Odea, MD      . phenylephrine (NEO-SYNEPHRINE) 10 mg in sodium chloride 0.9 % 250 mL (0.04 mg/mL) infusion  0-400 mcg/min Intravenous Titrated Collene Gobble, MD   Stopped at 09/19/16 1343  . pioglitazone (ACTOS) tablet 15 mg  15 mg Oral Daily Rhetta Mura Schorr, NP   15 mg at 09/21/16 0907  . protein supplement (PREMIER PROTEIN) liquid  11 oz Oral BID BM Caren Griffins, MD   11 oz at 09/21/16 0908  . senna (SENOKOT) tablet 8.6 mg  1 tablet Oral BID Debbe Odea, MD   8.6 mg at 09/19/16 1008  . simethicone (MYLICON) chewable tablet 80 mg  80 mg Oral Q6H PRN Collene Gobble, MD   80 mg at 09/21/16 0755  . sodium chloride flush (NS) 0.9 % injection 3 mL  3 mL Intravenous Q12H Debbe Odea, MD   3 mL at 09/21/16 0908     Discharge Medications: Please see discharge summary for a list of discharge medications.  Relevant Imaging Results:  Relevant Lab Results:   Additional Information SSN [id number]  Burnis Medin, LCSW

## 2016-09-21 NOTE — Progress Notes (Signed)
PROGRESS NOTE  Carrie Atkins XVQ:[id number] DOB: 1933/12/18 DOA: 09/17/2016 PCP: [name]., MD   LOS: 3 days   Brief Narrative: 83 y.o. female with medical history significant or CLL, diabetes mellitus with neuropathy, hypertension and mild LV outflow tract obstruction follows with [name], presents with elevated blood sugar in the setting of not taking her own medications at home.  She cannot explain why she stopped her medications at home.  She also has had poor p.o. intake at home.  She was admitted to stepdown due to requirements for insulin infusion, she was otherwise stable on admission, however on 4/6 in early afternoon she started becoming more and more hypotensive.  The CCM was consulted, her antibiotic regimen was broadened however eventually needed to be started on pressors.  She has improved, she is off vasopressors and her blood pressures have remained stable. She has tolerated resumption of her Coreg and was transferred to floor on 4/9  Assessment & Plan: Principal Problem:   DKA (diabetic ketoacidoses) ([place]) Active Problems:   HTN (hypertension)   CLL (chronic lymphocytic leukemia) ([facility])   Diabetic polyneuropathy associated with type 2 diabetes mellitus ([place])   Acute encephalopathy   Acute cystitis without hematuria   AKI (acute kidney injury) ([place])   New onset a-fib ([place])   Hypotension  Shock likely due to ESBL UTI -Unclear etiology, questionable dehydration versus septic.  She required pressors 4/6-4/7, now off -Cortisol was appropriately elevated at 21 -she was placed on broad-spectrum antibiotics with vancomycin and cefepime, blood cultures with 1 out of 2 bottles of coag negative staph, likely contaminant, discontinue vancomycin -she has done well with resumption of her Coreg, move to floor today  -Urine culture (the second one) speciated with ESBL E. coli, change antibiotics to carbapenem today  DKA (diabetic ketoacidoses) -On admission insulin infusion,  now gap closed, she was placed on sliding scale and CBGs are better controlled this morning, continue current modified diet and her home medications -Hemoglobin A1c is 12.6 -according to the daughter when the patient left the rehab facility last year, she was given Insulin at home and became confused. They attributed the confusion to the insulin and then stopped giving it. She had previously been receiving insulin at the hospital and subsequently at rehab.   A-fib with RVR -new diagnosis -CHA2DS2-VASc Score at least 5 -ECHO on 4/6 showed ejection fraction of 60-65%, mild LVH and grade 1 diastolic dysfunction -TSH, free T4 normal -Cardiology following, not a good candidate for anticoagulation  Acute kidney injury -Likely in the setting of DKA, dehydration, rhabdomyolysis, resolved  Fall with mild Rhabdomyolysis -PT eval recommended SNF  UTI, leukocytosis with left shift -Antibiotics broadened 4/6 as above  Acute encephalopathy -due to possible underlying infectious process versus DKA, improving -CT scan and MRI of the brain negative  HTN (hypertension) -now on pressors  CLL (chronic lymphocytic leukemia)    DVT prophylaxis: Lovenox Code Status: Full code Family Communication: no family at bedside, d/w daughter over the phone 4/7, called daughter 4/9, no answer Disposition Plan: SNF 2 days  Consultants:   Cardiology   PCCM  Procedures:   2D echo Study Conclusions - Left ventricle: The cavity size was normal. Wall thickness was increased in a pattern of mild LVH. Systolic function was normal. The estimated ejection fraction was in the range of 60% to 65%. Wall motion was normal; there were no regional wall motion abnormalities. Doppler parameters are consistent with abnormal left ventricular relaxation (grade 1 diastolic dysfunction). Left  atrium: The atrium was mildly dilated. Atrial septum: No defect or patent foramen ovale was identified. Pulmonary arteries: PA peak  pressure: 37 mm Hg (S).  Antimicrobials:  Ceftriaxone 4/5 >> 4/6  Vancomycin 4/6 >>   Cefepime 4/6 >>4/9  Meropenem 4/9 >>  Subjective: -She is more alert today, has no complaints, denies any chest pain, has no shortness of breath.   Objective: Vitals:   09/21/16 0700 09/21/16 0756 09/21/16 0800 09/21/16 0900  BP: (!) 130/57 (!) 130/57  (!) 144/74  Pulse: (!) 106 100 (!) 58 73  Resp: (!) 25  (!) 25 (!) 29  Temp:   98.4 F (36.9 C)   TempSrc:   Oral   SpO2: 98%  100% 100%  Weight:      Height:        Intake/Output Summary (Last 24 hours) at 09/21/16 1057 Last data filed at 09/21/16 0800  Gross per 24 hour  Intake             1210 ml  Output              150 ml  Net             1060 ml   Filed Weights   09/17/16 1053  Weight: 66.4 kg (146 lb 6 oz)    Examination: Constitutional: NAD, pale appearing Vitals:   09/21/16 0700 09/21/16 0756 09/21/16 0800 09/21/16 0900  BP: (!) 130/57 (!) 130/57  (!) 144/74  Pulse: (!) 106 100 (!) 58 73  Resp: (!) 25  (!) 25 (!) 29  Temp:   98.4 F (36.9 C)   TempSrc:   Oral   SpO2: 98%  100% 100%  Weight:      Height:       Eyes: lids and conjunctivae normal ENMT: Mucous membranes are moist. No oropharyngeal exudates Neck: normal, supple, no masses, no thyromegaly Respiratory: clear to auscultation bilaterally, no wheezing, no crackles. Normal respiratory effort.  Cardiovascular: Regular, no M, No LE edema. 2+ pedal pulses.  Abdomen: no tenderness. Bowel sounds positive.  Neurologic: CN 2-12 grossly intact. Strength equal   Data Reviewed: I have personally reviewed following labs and imaging studies  CBC:  Recent Labs Lab 09/17/16 1222 09/18/16 0153 09/19/16 0332 09/20/16 0342 09/21/16 0339  WBC 36.6* 18.3* 21.0* 9.1 10.3  NEUTROABS 22.7*  --   --   --   --   HGB 13.2 9.6* 8.8* 7.9* 9.7*  HCT 38.4 27.5* 25.9* 22.9* 29.6*  MCV 92.3 88.1 93.5 90.5 91.6  PLT 346 192 214 146* 676   Basic Metabolic  Panel:  Recent Labs Lab 09/18/16 1038 09/18/16 1356 09/19/16 0332 09/20/16 0620 09/21/16 0339  NA 135 135 138 140 138  K 3.4* 3.3* 3.2* 2.7* 3.9  CL 104 105 113* 117* 114*  CO2 23 22 18* 18* 17*  GLUCOSE 162* 160* 97 111* 196*  BUN 45* 44* 31* 15 13  CREATININE 1.28* 1.25* 0.99 0.78 0.85  CALCIUM 8.0* 7.7* 7.3* 7.2* 7.9*   GFR: Estimated Creatinine Clearance: 45.2 mL/min (by C-G formula based on SCr of 0.85 mg/dL). Liver Function Tests:  Recent Labs Lab 09/17/16 1222 09/20/16 0620  AST 29 23  ALT 21 19  ALKPHOS 95 41  BILITOT 1.3* 0.6  PROT 8.0 4.6*  ALBUMIN 4.2 2.2*   No results for input(s): LIPASE, AMYLASE in the last 168 hours. No results for input(s): AMMONIA in the last 168 hours. Coagulation Profile:  Recent Labs Lab 09/17/16  1223  INR 1.37   Cardiac Enzymes:  Recent Labs Lab 09/17/16 1329 09/18/16 0402  CKTOTAL 500* 269*   BNP (last 3 results) No results for input(s): PROBNP in the last 8760 hours. HbA1C:  Recent Labs  09/18/16 1130  HGBA1C 12.6*   CBG:  Recent Labs Lab 09/20/16 0755 09/20/16 1201 09/20/16 1546 09/20/16 2125 09/21/16 0734  GLUCAP 112* 233* 230* 167* 195*   Lipid Profile: No results for input(s): CHOL, HDL, LDLCALC, TRIG, CHOLHDL, LDLDIRECT in the last 72 hours. Thyroid Function Tests: No results for input(s): TSH, T4TOTAL, FREET4, T3FREE, THYROIDAB in the last 72 hours. Anemia Panel: No results for input(s): VITAMINB12, FOLATE, FERRITIN, TIBC, IRON, RETICCTPCT in the last 72 hours. Urine analysis:    Component Value Date/Time   COLORURINE STRAW (A) 09/17/2016 1222   APPEARANCEUR CLEAR 09/17/2016 1222   LABSPEC 1.007 09/17/2016 1222   PHURINE 5.0 09/17/2016 1222   GLUCOSEU >=500 (A) 09/17/2016 1222   HGBUR MODERATE (A) 09/17/2016 1222   BILIRUBINUR NEGATIVE 09/17/2016 1222   KETONESUR 5 (A) 09/17/2016 1222   PROTEINUR NEGATIVE 09/17/2016 1222   NITRITE POSITIVE (A) 09/17/2016 1222   LEUKOCYTESUR SMALL (A)  09/17/2016 1222   Sepsis Labs: Invalid input(s): PROCALCITONIN, LACTICIDVEN  Recent Results (from the past 240 hour(s))  Urine culture     Status: Abnormal   Collection Time: 09/17/16 12:23 PM  Result Value Ref Range Status   Specimen Description URINE, RANDOM  Final   Special Requests NONE  Final   Culture MULTIPLE SPECIES PRESENT, SUGGEST RECOLLECTION (A)  Final   Report Status 09/18/2016 FINAL  Final  MRSA PCR Screening     Status: None   Collection Time: 09/17/16  5:16 PM  Result Value Ref Range Status   MRSA by PCR NEGATIVE NEGATIVE Final    Comment:        The GeneXpert MRSA Assay (FDA approved for NASAL specimens only), is one component of a comprehensive MRSA colonization surveillance program. It is not intended to diagnose MRSA infection nor to guide or monitor treatment for MRSA infections.   Culture, blood (Routine X 2) w Reflex to ID Panel     Status: Abnormal   Collection Time: 09/18/16  6:42 PM  Result Value Ref Range Status   Specimen Description BLOOD RIGHT HAND  Final   Special Requests IN PEDIATRIC BOTTLE Blood Culture adequate volume  Final   Culture  Setup Time   Final    GRAM POSITIVE COCCI IN CLUSTERS IN PEDIATRIC BOTTLE CRITICAL RESULT CALLED TO, READ BACK BY AND VERIFIED WITH: D. WOFFORD, PHARM AT [facility], 09/19/16 AT 2028 BY J FUDESCO    Culture (A)  Final    STAPHYLOCOCCUS SPECIES (COAGULASE NEGATIVE) THE SIGNIFICANCE OF ISOLATING THIS ORGANISM FROM A SINGLE SET OF BLOOD CULTURES WHEN MULTIPLE SETS ARE DRAWN IS UNCERTAIN. PLEASE NOTIFY THE MICROBIOLOGY DEPARTMENT WITHIN ONE WEEK IF SPECIATION AND SENSITIVITIES ARE REQUIRED. Performed at [facility], [place] [address], [place], [place] [address]    Report Status 09/21/2016 FINAL  Final  Blood Culture ID Panel (Reflexed)     Status: Abnormal   Collection Time: 09/18/16  6:42 PM  Result Value Ref Range Status   Enterococcus species NOT DETECTED NOT DETECTED Final   Listeria monocytogenes NOT  DETECTED NOT DETECTED Final   Staphylococcus species DETECTED (A) NOT DETECTED Final    Comment: Methicillin (oxacillin) resistant coagulase negative staphylococcus. Possible blood culture contaminant (unless isolated from more than one blood culture draw or clinical  case suggests pathogenicity). No antibiotic treatment is indicated for blood  culture contaminants. CRITICAL RESULT CALLED TO, READ BACK BY AND VERIFIED WITH: D WOFFORD,PHARMD AT 2028 09/19/16 BY J FUDESCO    Staphylococcus aureus NOT DETECTED NOT DETECTED Final   Methicillin resistance DETECTED (A) NOT DETECTED Final    Comment: CRITICAL RESULT CALLED TO, READ BACK BY AND VERIFIED WITH: D WOFFORD, PHARM AT [facility], 09/19/16 AT 2028 BY J FUDESCO    Streptococcus species NOT DETECTED NOT DETECTED Final   Streptococcus agalactiae NOT DETECTED NOT DETECTED Final   Streptococcus pneumoniae NOT DETECTED NOT DETECTED Final   Streptococcus pyogenes NOT DETECTED NOT DETECTED Final   Acinetobacter baumannii NOT DETECTED NOT DETECTED Final   Enterobacteriaceae species NOT DETECTED NOT DETECTED Final   Enterobacter cloacae complex NOT DETECTED NOT DETECTED Final   Escherichia coli NOT DETECTED NOT DETECTED Final   Klebsiella oxytoca NOT DETECTED NOT DETECTED Final   Klebsiella pneumoniae NOT DETECTED NOT DETECTED Final   Proteus species NOT DETECTED NOT DETECTED Final   Serratia marcescens NOT DETECTED NOT DETECTED Final   Haemophilus influenzae NOT DETECTED NOT DETECTED Final   Neisseria meningitidis NOT DETECTED NOT DETECTED Final   Pseudomonas aeruginosa NOT DETECTED NOT DETECTED Final   Candida albicans NOT DETECTED NOT DETECTED Final   Candida glabrata NOT DETECTED NOT DETECTED Final   Candida krusei NOT DETECTED NOT DETECTED Final   Candida parapsilosis NOT DETECTED NOT DETECTED Final   Candida tropicalis NOT DETECTED NOT DETECTED Final    Comment: Performed at [facility], [place]. [address] [place], [place] [address]  Culture,  blood (Routine X 2) w Reflex to ID Panel     Status: None (Preliminary result)   Collection Time: 09/18/16  7:51 PM  Result Value Ref Range Status   Specimen Description BLOOD RIGHT HAND  Final   Special Requests IN PEDIATRIC BOTTLE Blood Culture adequate volume  Final   Culture   Final    NO GROWTH 2 DAYS Performed at [facility], [place] [address] [place], [place] [address]    Report Status PENDING  Incomplete  Urine culture     Status: Abnormal   Collection Time: 09/19/16 11:30 AM  Result Value Ref Range Status   Specimen Description URINE, CATHETERIZED  Final   Special Requests NONE  Final   Culture (A)  Final    10,000 COLONIES/mL ESCHERICHIA COLI Confirmed Extended Spectrum Beta-Lactamase Producer (ESBL) Performed at [facility], [place] [address] [place], [place] [address]    Report Status 09/21/2016 FINAL  Final   Organism ID, Bacteria ESCHERICHIA COLI (A)  Final      Susceptibility   Escherichia coli - MIC*    AMPICILLIN >=32 RESISTANT Resistant     CEFAZOLIN >=64 RESISTANT Resistant     CEFTRIAXONE >=64 RESISTANT Resistant     CIPROFLOXACIN >=4 RESISTANT Resistant     GENTAMICIN >=16 RESISTANT Resistant     IMIPENEM <=0.25 SENSITIVE Sensitive     NITROFURANTOIN <=16 SENSITIVE Sensitive     TRIMETH/SULFA >=320 RESISTANT Resistant     AMPICILLIN/SULBACTAM >=32 RESISTANT Resistant     PIP/TAZO <=4 SENSITIVE Sensitive     Extended ESBL POSITIVE Resistant     * 10,000 COLONIES/mL ESCHERICHIA COLI      Radiology Studies: No results found.   Scheduled Meds: . aspirin EC  81 mg Oral Daily  . carvedilol  3.125 mg Oral BID WC  . ceFEPime (MAXIPIME) IV  1 g Intravenous  Q12H  . enoxaparin (LOVENOX) injection  40 mg Subcutaneous Q24H  . famotidine  20 mg Oral Daily  . ferrous sulfate  325 mg Oral Q breakfast  . glimepiride  8 mg Oral Q breakfast  . insulin aspart  0-15 Units Subcutaneous TID WC  . insulin glargine  8 Units Subcutaneous QHS  . multivitamin  with minerals  1 tablet Oral Daily  . pioglitazone  15 mg Oral Daily  . protein supplement shake  11 oz Oral BID BM  . senna  1 tablet Oral BID  . sodium chloride flush  3 mL Intravenous Q12H   Continuous Infusions: . phenylephrine (NEO-SYNEPHRINE) Adult infusion Stopped (09/19/16 1343)    Marzetta Board, MD, PhD [facility] Pager [phone]  If 7PM-7AM, please contact night-coverage [email or web address] Password [facility] 09/21/2016, 10:57 AM

## 2016-09-21 NOTE — Progress Notes (Signed)
qPhysical Therapy Treatment Patient Details Name: Carrie Atkins MRN: [id number] DOB: [birth year] Today's Date: 09/21/2016    History of Present Illness 83 y.o. female with medical history significant for CLL, diabetes mellitus with neuropathy, hypertension and mild LV outflow tract obstruction and admitted for DKA, concern for sepsis, acute encephalopathy    PT Comments    Pt found in bed incont urine.  Assisted OOB to [facility].  Assisted with hygiene.  Assisted with amb only 2 feet due to weakness/fatigue then assisted back to bed.   Prior pt lives in an inlaw suite/apartment behind daughters house.  Pt admits she stopped taking her medicine because "no one was paying me any attention".  "My daughter has been busy with her husband who is sick".  "I was having a pity party".  Pt now knows "that was bad" and is agreeable to go to [place] to get better/stronger.  Follow Up Recommendations  SNF (pt prefers [facility])     Equipment Recommendations       Recommendations for Other Services       Precautions / Restrictions Precautions Precautions: Fall Precaution Comments: monitor VS Restrictions Weight Bearing Restrictions: No    Mobility  Bed Mobility Overal bed mobility: Needs Assistance Bed Mobility: Supine to Sit;Sit to Supine     Supine to sit: Min assist Sit to supine: Mod assist   General bed mobility comments: assisted out of bed with use of rail and HOB elevated.  Increased assist back to bed for b LE's.  Transfers Overall transfer level: Needs assistance Equipment used: None;Rolling walker (2 wheeled) Transfers: Stand Pivot Transfers   Stand pivot transfers: Mod assist       General transfer comment: 50% VC's on proper hand placement and turn completion from elevated bed to [facility].  Increased assist from sit to stand from lower BSC level.    Ambulation/Gait Ambulation/Gait assistance: Min assist Ambulation Distance (Feet): 2 Feet Assistive device:  Rolling walker (2 wheeled) Gait Pattern/deviations: Step-to pattern;Step-through pattern;Decreased step length - right;Decreased step length - left;Trunk flexed;Shuffle Gait velocity: Decreased   General Gait Details: very limited distance due to fatigue and weakness.  Also noted 3/4 dyspnea but HR was 80 and RA was 98%.     Stairs            Wheelchair Mobility    Modified Rankin (Stroke Patients Only)       Balance                                            Cognition Arousal/Alertness: Awake/alert Behavior During Therapy: WFL for tasks assessed/performed Overall Cognitive Status: Within Functional Limits for tasks assessed                                 General Comments: pt aware of her current situation      Exercises      General Comments        Pertinent Vitals/Pain Pain Assessment: No/denies pain    Home Living                      Prior Function            PT Goals (current goals can now be found in the care plan section) Progress towards PT goals:  Progressing toward goals    Frequency    Min 3X/week      PT Plan Current plan remains appropriate    Co-evaluation             End of Session Equipment Utilized During Treatment: Gait belt Activity Tolerance: Patient limited by fatigue Patient left: in bed;with bed alarm set   PT Visit Diagnosis: Difficulty in walking, not elsewhere classified (R26.2);Muscle weakness (generalized) (M62.81)     Time: 1500-1530 PT Time Calculation (min) (ACUTE ONLY): 30 min  Charges:  $Gait Training: 8-22 mins $Therapeutic Activity: 8-22 mins                    G Codes:       Rica Koyanagi  PTA [facility]  Acute  Rehab Pager      [phone]

## 2016-09-21 NOTE — Progress Notes (Signed)
Inpatient Diabetes Program Recommendations  AACE/ADA: New Consensus Statement on Inpatient Glycemic Control (2015)  Target Ranges:  Prepandial:   less than 140 mg/dL      Peak postprandial:   less than 180 mg/dL (1-2 hours)      Critically ill patients:  140 - 180 mg/dL   Lab Results  Component Value Date   GLUCAP 195 (H) 09/21/2016   HGBA1C 12.6 (H) 09/18/2016    Review of Glycemic Control  Diabetes history: DM2 Outpatient Diabetes medications:  Current orders for Inpatient glycemic control: Novolog 0-15 units tidwc, Amaryl 8 mg QAM, Actos 15 mg QD  Inpatient Diabetes Program Recommendations:    Add Lantus 10 units QHS Add HS correction  Will need to go home on insulin with HgbA1C of 12.6%.  Will continue to follow. Thank you. Lorenda Peck, RD, LDN, CDE Inpatient Diabetes Coordinator [phone]

## 2016-09-21 NOTE — Progress Notes (Signed)
Report called to receiving RN Katie and pt transferred to [id number].

## 2016-09-21 NOTE — Progress Notes (Signed)
Progress Note  Patient Name: Carrie Atkins Date of Encounter: 09/21/2016  Primary Cardiologist: Dr Acie Fredrickson  Subjective  83 yo , Hx of mild dynamic LVOT obstruction. Admitted after after being found down;  she  not taking her meds for a day or so   Pt awake and alert. She admits to occasional palpitations.  Inpatient Medications    Scheduled Meds: . aspirin EC  81 mg Oral Daily  . carvedilol  3.125 mg Oral BID WC  . ceFEPime (MAXIPIME) IV  1 g Intravenous Q12H  . enoxaparin (LOVENOX) injection  40 mg Subcutaneous Q24H  . famotidine  20 mg Oral Daily  . ferrous sulfate  325 mg Oral Q breakfast  . glimepiride  8 mg Oral Q breakfast  . insulin aspart  0-15 Units Subcutaneous TID WC  . multivitamin with minerals  1 tablet Oral Daily  . pioglitazone  15 mg Oral Daily  . protein supplement shake  11 oz Oral BID BM  . senna  1 tablet Oral BID  . sodium chloride flush  3 mL Intravenous Q12H   Continuous Infusions: . phenylephrine (NEO-SYNEPHRINE) Adult infusion Stopped (09/19/16 1343)   PRN Meds: acetaminophen **OR** acetaminophen, lip balm, ondansetron **OR** ondansetron (ZOFRAN) IV, simethicone   Vital Signs    Vitals:   09/21/16 0700 09/21/16 0756 09/21/16 0800 09/21/16 0900  BP: (!) 130/57 (!) 130/57  (!) 144/74  Pulse: (!) 106 100 (!) 58 73  Resp: (!) 25  (!) 25 (!) 29  Temp:   98.4 F (36.9 C)   TempSrc:   Oral   SpO2: 98%  100% 100%  Weight:      Height:        Intake/Output Summary (Last 24 hours) at 09/21/16 0949 Last data filed at 09/21/16 0800  Gross per 24 hour  Intake             1260 ml  Output              150 ml  Net             1110 ml   Filed Weights   09/17/16 1053  Weight: 146 lb 6 oz (66.4 kg)    Telemetry    NSR with short runs of PAF- currently in AF with VR 108 - Personally Reviewed  ECG    09/17/16 - AF with VR-90 Personally Reviewed  Physical Exam   GEN: No acute distress.  [place]: No JVD Cardiac:  RR , occasional PACs ,  no murmurs, rubs, or gallops.  Respiratory: Clear to auscultation bilaterally. GI: Soft, nontender, non-distended  MS: No edema; No deformity. Neuro:  Nonfocal  Psych: Normal affect   Labs    Chemistry Recent Labs Lab 09/17/16 1222  09/19/16 0332 09/20/16 0620 09/21/16 0339  NA 132*  < > 138 140 138  K 4.1  < > 3.2* 2.7* 3.9  CL 92*  < > 113* 117* 114*  CO2 15*  < > 18* 18* 17*  GLUCOSE 598*  < > 97 111* 196*  BUN 72*  < > 31* 15 13  CREATININE 1.92*  < > 0.99 0.78 0.85  CALCIUM 9.8  < > 7.3* 7.2* 7.9*  PROT 8.0  --   --  4.6*  --   ALBUMIN 4.2  --   --  2.2*  --   AST 29  --   --  23  --   ALT 21  --   --  19  --   ALKPHOS 95  --   --  41  --   BILITOT 1.3*  --   --  0.6  --   GFRNONAA 23*  < > 52* >60 >60  GFRAA 27*  < > >60 >60 >60  ANIONGAP 25*  < > 7 5 7   < > = values in this interval not displayed.   Hematology Recent Labs Lab 09/19/16 0332 09/20/16 0342 09/21/16 0339  WBC 21.0* 9.1 10.3  RBC 2.77* 2.53* 3.23*  HGB 8.8* 7.9* 9.7*  HCT 25.9* 22.9* 29.6*  MCV 93.5 90.5 91.6  MCH 31.8 31.2 30.0  MCHC 34.0 34.5 32.8  RDW 13.5 13.4 13.6  PLT 214 146* 151    Radiology    CXR 2V-09/17/16- COMPARISON:  02/04/2016 .  FINDINGS: Mediastinum hilar structures normal. Lungs are clear. Interim clearing of previously identified pulmonary interstitial edema. No prominent pleural effusion. No pneumothorax.  IMPRESSION: No acute cardiopulmonary disease.    Cardiac Studies   Echo 09/18/16-  Study Conclusions  - Left ventricle: The cavity size was normal. Wall thickness was   increased in a pattern of mild LVH. Systolic function was normal.   The estimated ejection fraction was in the range of 60% to 65%.   Wall motion was normal; there were no regional wall motion   abnormalities. Doppler parameters are consistent with abnormal   left ventricular relaxation (grade 1 diastolic dysfunction). - Left atrium: The atrium was mildly dilated. - Atrial septum:  No defect or patent foramen ovale was identified. - Pulmonary arteries: PA peak pressure: 37 mm Hg (S).  Patient Profile     83 y.o. female followed by Dr Acie Fredrickson with a history significant for mild LV outflow tract obstruction, HTN, CLL and Diabetes Type 2. She lives in an apartment behind her daughter's house. The patient had fallen and was found on the floor in the morning by her daughter who also noted that the patient had not taken her meds since Sunday by looking at her pill box. She had an elevated glucose level over 700 at home and was 598 on admission.   The patient was found to be in atrial fibrillation in the ED. The patient is not very clear on her recent symptoms. She denies any chest pain and may have had some trouble breathing on Saturday. She appears to have a UTI by UA. Since adm she has been in and out of AF.   Assessment & Plan    Atrial fibrillation  -Has history of mild LV outflow tract obstruction. EF 55-60%. No history of afib. -She was noted to be tachycardiac with rates in the 130's in the ED. EKG shows atrial fibrillation with rate controlled. HR slowed with IV metoprolol and now sinus rhythm with intermittent afib in the low 100's. -TSH 1.285 -CHA2DS2/VAS Stroke Risk Score 6 (CHF, HTN, Age (2), DM, female) . Pt has frequent falls, confusion, and anemia. She may not be a good candidate for anticoagulation. Fall risk being assessed with PT per IM. If pt going back home she would be at increased risk for bleeding and at possible not taking her meds safely.  -This is likely reactionary in setting of UTI, dehydration, rhabdo after a fall. -Continue coreg and prn metoprolol for sustained tachycardia. Work on correcting underlying issues of UTI, dehydration, DKA      Dehydration -Lasix stopped and pt being hydrated.   Fall with rhabdomyolysis -CK 500--> 269, SCr 1.25-0.85 -Treated with IV fluids.  HTN -At home on carvedilol and valsartan -Currently BP  93-126/37-75 -holding ARB due to acute renal insufficiency, although improving with hydration  Renal insufficiency -SCr 1.92---> 1.28.  Her SCr in 06/2015 was normal  <1 -Likely related to dehydration and her recent fall and not eating and drinking at home.   DM Type 2 with DKA -Glucose level >700 PTA and 598 on arrival to ED.  Management by IM  UTI, leukocytosis with left shift -  Managed by IM  Plan- Coreg resumed- will follow on telemetry.   Signed, Kerin Ransom, PA-C  09/21/2016, 9:49 AM    Attending Note:   The patient was seen and examined.  Agree with assessment and plan as noted above.  Changes made to the above note as needed.  Patient seen and independently examined with Kerin Ransom, PA .   We discussed all aspects of the encounter. I agree with the assessment and plan as stated above.  1. Atrial fib with RVR.  Paroxysmal atrial fib :  Clinically it sounds ( and appears on tele) that she has converted to NSR. Will get a 12 lead to verify  If she has converted, this strongly suggests that this episode of PAF was due to her underlying medical issues ( UTI, skipping meds, DKA)  She is a poor candidate for anticoagulation given her since this episode seems to be related to an acute underlying medical illness, I think it is less necessary to consider anticoagulation.  She seems to be doing quite a bit better on her current medications.     I have spent a total of 40 minutes with patient reviewing hospital  notes , telemetry, EKGs, labs and examining patient as well as establishing an assessment and plan that was discussed with the patient. > 50% of time was spent in direct patient care.    Thayer Headings, Brooke Bonito., MD, [facility] 09/21/2016, 11:50 AM [address]  [place] Pager [phone]

## 2016-09-22 DIAGNOSIS — A419 Sepsis, unspecified organism: Secondary | ICD-10-CM

## 2016-09-22 DIAGNOSIS — R6521 Severe sepsis with septic shock: Secondary | ICD-10-CM

## 2016-09-22 DIAGNOSIS — Z1612 Extended spectrum beta lactamase (ESBL) resistance: Secondary | ICD-10-CM

## 2016-09-22 DIAGNOSIS — A499 Bacterial infection, unspecified: Secondary | ICD-10-CM

## 2016-09-22 LAB — BASIC METABOLIC PANEL
ANION GAP: 8 (ref 5–15)
BUN: 11 mg/dL (ref 6–20)
CALCIUM: 8.3 mg/dL — AB (ref 8.9–10.3)
CHLORIDE: 112 mmol/L — AB (ref 101–111)
CO2: 19 mmol/L — AB (ref 22–32)
Creatinine, Ser: 0.77 mg/dL (ref 0.44–1.00)
GFR calc Af Amer: >60 mL/min (ref >60)
GFR calc non Af Amer: >60 mL/min (ref >60)
GLUCOSE: 132 mg/dL — AB (ref 65–99)
Potassium: 3.9 mmol/L (ref 3.5–5.1)
Sodium: 139 mmol/L (ref 135–145)

## 2016-09-22 LAB — GLUCOSE, CAPILLARY
GLUCOSE-CAPILLARY: 134 mg/dL — AB (ref 65–99)
Glucose-Capillary: 242 mg/dL — ABNORMAL HIGH (ref 65–99)
Glucose-Capillary: 212 mg/dL — ABNORMAL HIGH (ref 65–99)
Glucose-Capillary: 184 mg/dL — ABNORMAL HIGH (ref 65–99)

## 2016-09-22 LAB — CBC
HEMATOCRIT: 28.9 % — AB (ref 36.0–46.0)
HEMOGLOBIN: 9.7 g/dL — AB (ref 12.0–15.0)
MCH: 31.4 pg (ref 26.0–34.0)
MCHC: 33.6 g/dL (ref 30.0–36.0)
MCV: 93.5 fL (ref 78.0–100.0)
Platelets: 167 10*3/uL (ref 150–400)
RBC: 3.09 MIL/uL — AB (ref 3.87–5.11)
RDW: 14 % (ref 11.5–15.5)
WBC: 11.7 10*3/uL — ABNORMAL HIGH (ref 4.0–10.5)

## 2016-09-22 MED ORDER — POLYVINYL ALCOHOL 1.4 % OP SOLN
1.0000 [drp] | OPHTHALMIC | Status: DC | PRN
  Administered 2016-09-22: 1 [drp] via OPHTHALMIC
  Filled 2016-09-22: qty 15

## 2016-09-22 MED ORDER — INSULIN STARTER KIT- SYRINGES (ENGLISH)
1.0000 | Freq: Once | Status: AC
  Administered 2016-09-22: 1
  Filled 2016-09-22: qty 1

## 2016-09-22 NOTE — Progress Notes (Addendum)
PROGRESS NOTE  Carrie Atkins KGY:[id number] DOB: 03-04-1934 DOA: 09/17/2016 PCP: [name]., MD   LOS: 4 days   Brief Narrative: 83 y.o. female with medical history significant or CLL, diabetes mellitus with neuropathy, hypertension and mild LV outflow tract obstruction follows with [name], presents with elevated blood sugar in the setting of not taking her own medications at home.  She cannot explain why she stopped her medications at home.  She also has had poor p.o. intake at home.  She was admitted to stepdown due to requirements for insulin infusion, she was otherwise stable on admission, however on 4/6 in early afternoon she started becoming more and more hypotensive.  The CCM was consulted, her antibiotic regimen was broadened however eventually needed to be started on pressors.  She has improved, she is off vasopressors and her blood pressures have remained stable. She has tolerated resumption of her Coreg and was transferred to floor on 4/9.  Urine cultures speciated ESBL E. coli, and was started on meropenem on 4/9  Assessment & Plan: Principal Problem:   DKA (diabetic ketoacidoses) ([place]) Active Problems:   HTN (hypertension)   CLL (chronic lymphocytic leukemia) (HCC)   Diabetic polyneuropathy associated with type 2 diabetes mellitus ([place])   Acute encephalopathy   Acute cystitis without hematuria   AKI (acute kidney injury) ([place])   Paroxysmal atrial fibrillation (H[facility])   Hypotension   Severe sepsis with septic shock (HCC)   ESBL (extended spectrum beta-lactamase) producing bacteria infection  Septic shock likely due to ESBL UTI -Cortisol was appropriately elevated at 21 -she was initially placed on broad-spectrum antibiotics with vancomycin and cefepime, blood cultures with 1 out of 2 bottles of coag negative staph, likely contaminant, discontinued vancomycin -Most likely etiology for her septic shock in her second hospital day was due to ESBL UTI.  Culture speciated  yesterday on 4/9 and she was changed to meropenem on 4/9.  Plan to treat in a hospital setting for 48 hours, if stable may be able to go to SNF on 4/11.  Place a PICC line today, plan for IV antibiotics for 7-10 days  DKA (diabetic ketoacidoses) -On admission insulin infusion, now gap closed, she was placed on sliding scale and CBGs are better controlled this morning, continue current modified diet and her home medications -Hemoglobin A1c is 12.6 -according to the daughter when the patient left the rehab facility last year, she was given Insulin at home and became confused. They attributed the confusion to the insulin and then stopped giving it. She had previously been receiving insulin at the hospital and subsequently at rehab.   A-fib with RVR -new diagnosis, CHA2DS2-VASc Score at least 5, ECHO on 4/6 showed ejection fraction of 60-65%, mild LVH and grade 1 diastolic dysfunction -TSH, free T4 normal -Cardiology following, not a good candidate for anticoagulation, medications adjusted, currently on Coreg.  Still has runs of paroxysmal A. fib  Acute kidney injury -Likely in the setting of DKA, dehydration, rhabdomyolysis, resolved  Fall with mild Rhabdomyolysis -PT eval recommended SNF, SW involved  UTI, leukocytosis with left shift -Antibiotics broadened 4/9 as above, now on meropenem  Acute encephalopathy -due to possible underlying infectious process versus DKA, improving -CT scan and MRI of the brain negative  HTN (hypertension) -now on pressors  CLL (chronic lymphocytic leukemia)    DVT prophylaxis: Lovenox Code Status: Full code Family Communication: no family at bedside, d/w daughter over the phone 4/7, called daughter 4/9, no answer Disposition Plan: SNF 1 day  Consultants:   Cardiology   PCCM  Procedures:   2D echo Study Conclusions - Left ventricle: The cavity size was normal. Wall thickness was increased in a pattern of mild LVH. Systolic function was  normal. The estimated ejection fraction was in the range of 60% to 65%. Wall motion was normal; there were no regional wall motion abnormalities. Doppler parameters are consistent with abnormal left ventricular relaxation (grade 1 diastolic dysfunction). Left atrium: The atrium was mildly dilated. Atrial septum: No defect or patent foramen ovale was identified. Pulmonary arteries: PA peak pressure: 37 mm Hg (S).  Antimicrobials:  Ceftriaxone 4/5 >> 4/6  Vancomycin 4/6 >> 4/9  Cefepime 4/6 >>4/9  Meropenem 4/9 >>  Subjective: -She is alert today, has no complaints, denies any chest pain, has no shortness of breath.  Objective: Vitals:   09/21/16 0900 09/21/16 1107 09/21/16 2201 09/22/16 0700  BP: (!) 144/74 (!) 147/82 125/64 130/68  Pulse: 73 (!) 105 (!) 59 96  Resp: (!) 29 (!) 22 18 18   Temp:  98.2 F (36.8 C) 99.1 F (37.3 C) 97.9 F (36.6 C)  TempSrc:  Oral Oral Oral  SpO2: 100% 100% 100% 100%  Weight:      Height:        Intake/Output Summary (Last 24 hours) at 09/22/16 1047 Last data filed at 09/22/16 0930  Gross per 24 hour  Intake              860 ml  Output              350 ml  Net              510 ml   Filed Weights   09/17/16 1053  Weight: 66.4 kg (146 lb 6 oz)    Examination: Constitutional: NAD, pale appearing Vitals:   09/21/16 0900 09/21/16 1107 09/21/16 2201 09/22/16 0700  BP: (!) 144/74 (!) 147/82 125/64 130/68  Pulse: 73 (!) 105 (!) 59 96  Resp: (!) 29 (!) 22 18 18   Temp:  98.2 F (36.8 C) 99.1 F (37.3 C) 97.9 F (36.6 C)  TempSrc:  Oral Oral Oral  SpO2: 100% 100% 100% 100%  Weight:      Height:       Eyes: lids and conjunctivae normal ENMT: Mucous membranes are moist. No oropharyngeal exudates Neck: normal, supple, no masses, no thyromegaly Respiratory: clear to auscultation bilaterally, no wheezing, no crackles. Normal respiratory effort.  Cardiovascular: irregular, no M, No LE edema. 2+ pedal pulses.  Abdomen: no tenderness. Bowel  sounds positive.  Neurologic: CN 2-12 grossly intact. Strength equal   Data Reviewed: I have personally reviewed following labs and imaging studies  CBC:  Recent Labs Lab 09/17/16 1222 09/18/16 0153 09/19/16 0332 09/20/16 0342 09/21/16 0339 09/22/16 0525  WBC 36.6* 18.3* 21.0* 9.1 10.3 11.7*  NEUTROABS 22.7*  --   --   --   --   --   HGB 13.2 9.6* 8.8* 7.9* 9.7* 9.7*  HCT 38.4 27.5* 25.9* 22.9* 29.6* 28.9*  MCV 92.3 88.1 93.5 90.5 91.6 93.5  PLT 346 192 214 146* 151 947   Basic Metabolic Panel:  Recent Labs Lab 09/18/16 1356 09/19/16 0332 09/20/16 0620 09/21/16 0339 09/22/16 0525  NA 135 138 140 138 139  K 3.3* 3.2* 2.7* 3.9 3.9  CL 105 113* 117* 114* 112*  CO2 22 18* 18* 17* 19*  GLUCOSE 160* 97 111* 196* 132*  BUN 44* 31* 15 13 11   CREATININE 1.25* 0.99  0.78 0.85 0.77  CALCIUM 7.7* 7.3* 7.2* 7.9* 8.3*   GFR: Estimated Creatinine Clearance: 48.1 mL/min (by C-G formula based on SCr of 0.77 mg/dL). Liver Function Tests:  Recent Labs Lab 09/17/16 1222 09/20/16 0620  AST 29 23  ALT 21 19  ALKPHOS 95 41  BILITOT 1.3* 0.6  PROT 8.0 4.6*  ALBUMIN 4.2 2.2*   No results for input(s): LIPASE, AMYLASE in the last 168 hours. No results for input(s): AMMONIA in the last 168 hours. Coagulation Profile:  Recent Labs Lab 09/17/16 1223  INR 1.37   Cardiac Enzymes:  Recent Labs Lab 09/17/16 1329 09/18/16 0402  CKTOTAL 500* 269*   BNP (last 3 results) No results for input(s): PROBNP in the last 8760 hours. HbA1C: No results for input(s): HGBA1C in the last 72 hours. CBG:  Recent Labs Lab 09/21/16 0734 09/21/16 1206 09/21/16 1649 09/21/16 2200 09/22/16 0813  GLUCAP 195* 304* 205* 207* 134*   Lipid Profile: No results for input(s): CHOL, HDL, LDLCALC, TRIG, CHOLHDL, LDLDIRECT in the last 72 hours. Thyroid Function Tests: No results for input(s): TSH, T4TOTAL, FREET4, T3FREE, THYROIDAB in the last 72 hours. Anemia Panel: No results for input(s):  VITAMINB12, FOLATE, FERRITIN, TIBC, IRON, RETICCTPCT in the last 72 hours. Urine analysis:    Component Value Date/Time   COLORURINE STRAW (A) 09/17/2016 1222   APPEARANCEUR CLEAR 09/17/2016 1222   LABSPEC 1.007 09/17/2016 1222   PHURINE 5.0 09/17/2016 1222   GLUCOSEU >=500 (A) 09/17/2016 1222   HGBUR MODERATE (A) 09/17/2016 1222   BILIRUBINUR NEGATIVE 09/17/2016 1222   KETONESUR 5 (A) 09/17/2016 1222   PROTEINUR NEGATIVE 09/17/2016 1222   NITRITE POSITIVE (A) 09/17/2016 1222   LEUKOCYTESUR SMALL (A) 09/17/2016 1222   Sepsis Labs: Invalid input(s): PROCALCITONIN, LACTICIDVEN  Recent Results (from the past 240 hour(s))  Urine culture     Status: Abnormal   Collection Time: 09/17/16 12:23 PM  Result Value Ref Range Status   Specimen Description URINE, RANDOM  Final   Special Requests NONE  Final   Culture MULTIPLE SPECIES PRESENT, SUGGEST RECOLLECTION (A)  Final   Report Status 09/18/2016 FINAL  Final  MRSA PCR Screening     Status: None   Collection Time: 09/17/16  5:16 PM  Result Value Ref Range Status   MRSA by PCR NEGATIVE NEGATIVE Final    Comment:        The GeneXpert MRSA Assay (FDA approved for NASAL specimens only), is one component of a comprehensive MRSA colonization surveillance program. It is not intended to diagnose MRSA infection nor to guide or monitor treatment for MRSA infections.   Culture, blood (Routine X 2) w Reflex to ID Panel     Status: Abnormal   Collection Time: 09/18/16  6:42 PM  Result Value Ref Range Status   Specimen Description BLOOD RIGHT HAND  Final   Special Requests IN PEDIATRIC BOTTLE Blood Culture adequate volume  Final   Culture  Setup Time   Final    GRAM POSITIVE COCCI IN CLUSTERS IN PEDIATRIC BOTTLE CRITICAL RESULT CALLED TO, READ BACK BY AND VERIFIED WITH: D. WOFFORD, PHARM AT [facility], 09/19/16 AT 2028 BY J FUDESCO    Culture (A)  Final    STAPHYLOCOCCUS SPECIES (COAGULASE NEGATIVE) THE SIGNIFICANCE OF ISOLATING THIS ORGANISM  FROM A SINGLE SET OF BLOOD CULTURES WHEN MULTIPLE SETS ARE DRAWN IS UNCERTAIN. PLEASE NOTIFY THE MICROBIOLOGY DEPARTMENT WITHIN ONE WEEK IF SPECIATION AND SENSITIVITIES ARE REQUIRED. Performed at [facility], [place]  [address] [place], [place] [address]    Report Status 09/21/2016 FINAL  Final  Blood Culture ID Panel (Reflexed)     Status: Abnormal   Collection Time: 09/18/16  6:42 PM  Result Value Ref Range Status   Enterococcus species NOT DETECTED NOT DETECTED Final   Listeria monocytogenes NOT DETECTED NOT DETECTED Final   Staphylococcus species DETECTED (A) NOT DETECTED Final    Comment: Methicillin (oxacillin) resistant coagulase negative staphylococcus. Possible blood culture contaminant (unless isolated from more than one blood culture draw or clinical case suggests pathogenicity). No antibiotic treatment is indicated for blood  culture contaminants. CRITICAL RESULT CALLED TO, READ BACK BY AND VERIFIED WITH: D WOFFORD,PHARMD AT 2028 09/19/16 BY J FUDESCO    Staphylococcus aureus NOT DETECTED NOT DETECTED Final   Methicillin resistance DETECTED (A) NOT DETECTED Final    Comment: CRITICAL RESULT CALLED TO, READ BACK BY AND VERIFIED WITH: D WOFFORD, PHARM AT [facility], 09/19/16 AT 2028 BY J FUDESCO    Streptococcus species NOT DETECTED NOT DETECTED Final   Streptococcus agalactiae NOT DETECTED NOT DETECTED Final   Streptococcus pneumoniae NOT DETECTED NOT DETECTED Final   Streptococcus pyogenes NOT DETECTED NOT DETECTED Final   Acinetobacter baumannii NOT DETECTED NOT DETECTED Final   Enterobacteriaceae species NOT DETECTED NOT DETECTED Final   Enterobacter cloacae complex NOT DETECTED NOT DETECTED Final   Escherichia coli NOT DETECTED NOT DETECTED Final   Klebsiella oxytoca NOT DETECTED NOT DETECTED Final   Klebsiella pneumoniae NOT DETECTED NOT DETECTED Final   Proteus species NOT DETECTED NOT DETECTED Final   Serratia marcescens NOT DETECTED NOT DETECTED Final   Haemophilus  influenzae NOT DETECTED NOT DETECTED Final   Neisseria meningitidis NOT DETECTED NOT DETECTED Final   Pseudomonas aeruginosa NOT DETECTED NOT DETECTED Final   Candida albicans NOT DETECTED NOT DETECTED Final   Candida glabrata NOT DETECTED NOT DETECTED Final   Candida krusei NOT DETECTED NOT DETECTED Final   Candida parapsilosis NOT DETECTED NOT DETECTED Final   Candida tropicalis NOT DETECTED NOT DETECTED Final    Comment: Performed at [facility], [place]. [address] [place], [place] [address]  Culture, blood (Routine X 2) w Reflex to ID Panel     Status: None (Preliminary result)   Collection Time: 09/18/16  7:51 PM  Result Value Ref Range Status   Specimen Description BLOOD RIGHT HAND  Final   Special Requests IN PEDIATRIC BOTTLE Blood Culture adequate volume  Final   Culture   Final    NO GROWTH 4 DAYS Performed at [facility], [place] [address], [place], [place] [address]    Report Status PENDING  Incomplete  Urine culture     Status: Abnormal   Collection Time: 09/19/16 11:30 AM  Result Value Ref Range Status   Specimen Description URINE, CATHETERIZED  Final   Special Requests NONE  Final   Culture (A)  Final    10,000 COLONIES/mL ESCHERICHIA COLI Confirmed Extended Spectrum Beta-Lactamase Producer (ESBL) Performed at [facility], [place] [address] [place], [place] [address]    Report Status 09/21/2016 FINAL  Final   Organism ID, Bacteria ESCHERICHIA COLI (A)  Final      Susceptibility   Escherichia coli - MIC*    AMPICILLIN >=32 RESISTANT Resistant     CEFAZOLIN >=64 RESISTANT Resistant     CEFTRIAXONE >=64 RESISTANT Resistant     CIPROFLOXACIN >=4 RESISTANT Resistant     GENTAMICIN >=16 RESISTANT Resistant     IMIPENEM <=0.25  SENSITIVE Sensitive     NITROFURANTOIN <=16 SENSITIVE Sensitive     TRIMETH/SULFA >=320 RESISTANT Resistant     AMPICILLIN/SULBACTAM >=32 RESISTANT Resistant     PIP/TAZO <=4 SENSITIVE Sensitive     Extended ESBL POSITIVE Resistant      * 10,000 COLONIES/mL ESCHERICHIA COLI      Radiology Studies: No results found.   Scheduled Meds: . aspirin EC  81 mg Oral Daily  . carvedilol  3.125 mg Oral BID WC  . enoxaparin (LOVENOX) injection  40 mg Subcutaneous Q24H  . famotidine  20 mg Oral Daily  . ferrous sulfate  325 mg Oral Q breakfast  . glimepiride  8 mg Oral Q breakfast  . insulin aspart  0-15 Units Subcutaneous TID WC  . insulin glargine  8 Units Subcutaneous QHS  . meropenem (MERREM) IV  1 g Intravenous Q12H  . multivitamin with minerals  1 tablet Oral Daily  . pioglitazone  15 mg Oral Daily  . protein supplement shake  11 oz Oral BID BM  . senna  1 tablet Oral BID  . sodium chloride flush  3 mL Intravenous Q12H   Continuous Infusions: . phenylephrine (NEO-SYNEPHRINE) Adult infusion Stopped (09/19/16 1343)    Marzetta Board, MD, PhD [facility] Pager [phone]  If 7PM-7AM, please contact night-coverage [email or web address] Password [facility] 09/22/2016, 10:47 AM

## 2016-09-22 NOTE — Care Management Important Message (Addendum)
Important Message  Patient Details IM Letter given to Cookie/Case Manager to present to Patient  Name: KEIONA JENISON MRN: [id number] Date of Birth: 23-Mar-1934   Medicare Important Message Given:  Yes    Kerin Salen 09/22/2016, 11:23 [place] Message  Patient Details  Name: NOMIE BUCHBERGER MRN: [id number] Date of Birth: 1933-09-16   Medicare Important Message Given:  Yes    Kerin Salen 09/22/2016, 11:22 AM

## 2016-09-22 NOTE — Progress Notes (Signed)
Progress Note  Patient Name: Carrie Atkins Date of Encounter: 09/22/2016  Primary Cardiologist: Dr Acie Fredrickson  Subjective   She says she feels better- she denies being SOB  Inpatient Medications    Scheduled Meds: . aspirin EC  81 mg Oral Daily  . carvedilol  3.125 mg Oral BID WC  . enoxaparin (LOVENOX) injection  40 mg Subcutaneous Q24H  . famotidine  20 mg Oral Daily  . ferrous sulfate  325 mg Oral Q breakfast  . glimepiride  8 mg Oral Q breakfast  . insulin aspart  0-15 Units Subcutaneous TID WC  . insulin glargine  8 Units Subcutaneous QHS  . meropenem (MERREM) IV  1 g Intravenous Q12H  . multivitamin with minerals  1 tablet Oral Daily  . pioglitazone  15 mg Oral Daily  . protein supplement shake  11 oz Oral BID BM  . senna  1 tablet Oral BID  . sodium chloride flush  3 mL Intravenous Q12H   Continuous Infusions: . phenylephrine (NEO-SYNEPHRINE) Adult infusion Stopped (09/19/16 1343)   PRN Meds: acetaminophen **OR** acetaminophen, calcium carbonate, lip balm, ondansetron **OR** ondansetron (ZOFRAN) IV, simethicone   Vital Signs    Vitals:   09/21/16 0900 09/21/16 1107 09/21/16 2201 09/22/16 0700  BP: (!) 144/74 (!) 147/82 125/64 130/68  Pulse: 73 (!) 105 (!) 59 96  Resp: (!) 29 (!) 22 18 18   Temp:  98.2 F (36.8 C) 99.1 F (37.3 C) 97.9 F (36.6 C)  TempSrc:  Oral Oral Oral  SpO2: 100% 100% 100% 100%  Weight:      Height:        Intake/Output Summary (Last 24 hours) at 09/22/16 0748 Last data filed at 09/22/16 0600  Gross per 24 hour  Intake              800 ml  Output              350 ml  Net              450 ml   Filed Weights   09/17/16 1053  Weight: 146 lb 6 oz (66.4 kg)    Telemetry    Predominantly NSR with periods of PAF with RVR, PACs,  and periods of bradycardia- Personally Reviewed  ECG    09/21/16- NSR, SB-HR 58 with PACs- Personally Reviewed  Physical Exam   GEN: Obese, No acute distress.   Neck: No JVD Cardiac: RRR, no  murmurs, rubs, or gallops.  Respiratory: Clear to auscultation bilaterally. GI: Obese Soft, nontender, non-distended  MS: No edema; No deformity. Neuro:  Nonfocal  Psych: Normal affect   Labs    Chemistry Recent Labs Lab 09/17/16 1222  09/20/16 0620 09/21/16 0339 09/22/16 0525  NA 132*  < > 140 138 139  K 4.1  < > 2.7* 3.9 3.9  CL 92*  < > 117* 114* 112*  CO2 15*  < > 18* 17* 19*  GLUCOSE 598*  < > 111* 196* 132*  BUN 72*  < > 15 13 11   CREATININE 1.92*  < > 0.78 0.85 0.77  CALCIUM 9.8  < > 7.2* 7.9* 8.3*  PROT 8.0  --  4.6*  --   --   ALBUMIN 4.2  --  2.2*  --   --   AST 29  --  23  --   --   ALT 21  --  19  --   --   ALKPHOS 95  --  41  --   --   BILITOT 1.3*  --  0.6  --   --   GFRNONAA 23*  < > >60 >60 >60  GFRAA 27*  < > >60 >60 >60  ANIONGAP 25*  < > 5 7 8   < > = values in this interval not displayed.   Hematology Recent Labs Lab 09/20/16 0342 09/21/16 0339 09/22/16 0525  WBC 9.1 10.3 11.7*  RBC 2.53* 3.23* 3.09*  HGB 7.9* 9.7* 9.7*  HCT 22.9* 29.6* 28.9*  MCV 90.5 91.6 93.5  MCH 31.2 30.0 31.4  MCHC 34.5 32.8 33.6  RDW 13.4 13.6 14.0  PLT 146* 151 167     Radiology    CXR 09/17/16 CHEST  2 VIEW  COMPARISON:  02/04/2016 .  FINDINGS: Mediastinum hilar structures normal. Lungs are clear. Interim clearing of previously identified pulmonary interstitial edema. No prominent pleural effusion. No pneumothorax.  IMPRESSION: No acute cardiopulmonary disease.   Cardiac Studies   Echo 09/18/16 Study Conclusions  - Left ventricle: The cavity size was normal. Wall thickness was   increased in a pattern of mild LVH. Systolic function was normal.   The estimated ejection fraction was in the range of 60% to 65%.   Wall motion was normal; there were no regional wall motion   abnormalities. Doppler parameters are consistent with abnormal   left ventricular relaxation (grade 1 diastolic dysfunction). - Left atrium: The atrium was mildly dilated. -  Atrial septum: No defect or patent foramen ovale was identified. - Pulmonary arteries: PA peak pressure: 37 mm Hg (S).  Patient Profile     83 y.o. female followed by Dr Acie Fredrickson with a history significant for mild LV outflow tract obstruction, HTN, CLL and Diabetes Type 2. The patient had fallen and was found on the floor in the morning by her daughter who also noted that the patient had not taken her meds since Sunday. In ED she had DKIA, acute renal insufficiency, hypotension requiring IV pressors, and AF with RVR (new). She is suspected to have a UTI that may have triggered this episode.     Assessment & Plan    Atrial fibrillation  -Has history of mild LV outflow tract obstruction. EF 55-60%. No history of afib. -She was noted to be tachycardiac with rates in the 130's in the ED. EKG shows atrial fibrillation with rate controlled. HR slowed with IV metoprolol and now sinus rhythm with intermittent afib in the low 100's. -TSH 1.285 -CHA2DS2/VAS Stroke Risk Score 6 (CHF, HTN, Age (2), DM, female) . Pt has frequent falls, confusion, and anemia. She may not be a good candidate for anticoagulation. Fall risk being assessed with PT per IM. If pt going back home she would be at increased risk for bleeding and at possible not taking her meds safely.  -This is likely reactionary in setting of UTI, dehydration, rhabdo after a fall. -Continue low dose beta blocker, work on correcting underlying issues of UTI, dehydration, DKA  Dehydration -Lasix stopped and pt being hydrated. I/O + 10L  Fall with rhabdomyolysis -CK 500--> 269, SCr 1.25-0.85 -Treated with IV fluids.  HTN -At home on carvedilol and valsartan -Currently BP 130/68 - ARB held on admission due to acute renal insufficiency and hypotension, consider resuming now  Renal insufficiency -SCr 1.92--->0.77. Her SCr in 06/2015 was normal <1 -Likely related to dehydration and her recent fall and not eating and drinking at home.    DM Type 2 with DKA -Glucose level >700 PTA  and 598 on arrival to ED. Management by IM  UTI, leukocytosis with left shift - Managed by IM   Plan- MD to see consider resuming Diovan at lower dose. Continue Coreg at low dose.  She has short runs of PAF but also periods of bradycardia. No felt to be a candidate for anticoagulation secondary to fall risk.   Signed, Kerin Ransom, PA-C  09/22/2016, 7:48 AM    Attending Note:   The patient was seen and examined.  Agree with assessment and plan as noted above.  Changes made to the above note as needed.  Patient seen and independently examined with Kerin Ransom PA .   We discussed all aspects of the encounter. I agree with the assessment and plan as stated above.  1.  Dynamic LVOT obstruction - stable Continue coreg  2. Essential HTN :   BP is still normal. I would wait and start valsartan as her BP increases.  3. Atrial fib:   Poor candidate for long term anticouaulation given her fall risk Continue with rate control    I have spent a total of 40 minutes with patient reviewing hospital  notes , telemetry, EKGs, labs and examining patient as well as establishing an assessment and plan that was discussed with the patient. > 50% of time was spent in direct patient care.    Thayer Headings, Brooke Bonito., MD, [facility] 09/22/2016, 11:40 AM [address],  [place] Pager [phone]

## 2016-09-22 NOTE — Progress Notes (Signed)
Inpatient Diabetes Program Recommendations  AACE/ADA: New Consensus Statement on Inpatient Glycemic Control (2015)  Target Ranges:  Prepandial:   less than 140 mg/dL      Peak postprandial:   less than 180 mg/dL (1-2 hours)      Critically ill patients:  140 - 180 mg/dL   Results for Carrie Atkins, Carrie Atkins (MRN [id number]) as of 09/22/2016 13:39  Ref. Range 09/21/2016 07:34 09/21/2016 12:06 09/21/2016 16:49 09/21/2016 22:00  Glucose-Capillary Latest Ref Range: 65 - 99 mg/dL 195 (H) 304 (H) 205 (H) 207 (H)   Results for Carrie Atkins, Carrie Atkins (MRN [id number]) as of 09/22/2016 13:39  Ref. Range 09/22/2016 08:13 09/22/2016 11:41  Glucose-Capillary Latest Ref Range: 65 - 99 mg/dL 134 (H) 242 (H)    Home DM Meds: Amaryl 8 mg daily       Actos 15 mg daily  Current Insulin Orders: Amaryl 8 mg daily         Actos 15 mg daily      Lantus 8 units QHS      Novolog Moderate Correction Scale/ SSI (0-15 units) TID AC     MD- Fasting glucose much improved today after the addition of Lantus at bedtime.  CBG down to 134 mg/dl this AM.  Still having postprandial glucose elevations.    Please consider the following in-hospital insulin adjustments:  Start Novolog Meal Coverage: Novolog 3 units TID with meals (hold if pt eats <50% of meal)     --Will follow patient during hospitalization--  Wyn Quaker RN, MSN, CDE Diabetes Coordinator Inpatient Glycemic Control Team Team Pager: [phone] (8a-5p)

## 2016-09-23 DIAGNOSIS — C9110 Chronic lymphocytic leukemia of B-cell type not having achieved remission: Secondary | ICD-10-CM

## 2016-09-23 LAB — CBC
HEMATOCRIT: 30.6 % — AB (ref 36.0–46.0)
Hemoglobin: 10.2 g/dL — ABNORMAL LOW (ref 12.0–15.0)
MCH: 32.1 pg (ref 26.0–34.0)
MCHC: 33.3 g/dL (ref 30.0–36.0)
MCV: 96.2 fL (ref 78.0–100.0)
Platelets: 176 10*3/uL (ref 150–400)
RBC: 3.18 MIL/uL — ABNORMAL LOW (ref 3.87–5.11)
RDW: 14.3 % (ref 11.5–15.5)
WBC: 10.8 10*3/uL — AB (ref 4.0–10.5)

## 2016-09-23 LAB — GLUCOSE, CAPILLARY
GLUCOSE-CAPILLARY: 242 mg/dL — AB (ref 65–99)
GLUCOSE-CAPILLARY: 207 mg/dL — AB (ref 65–99)
Glucose-Capillary: 162 mg/dL — ABNORMAL HIGH (ref 65–99)

## 2016-09-23 LAB — BASIC METABOLIC PANEL
Anion gap: 6 (ref 5–15)
BUN: 13 mg/dL (ref 6–20)
CHLORIDE: 110 mmol/L (ref 101–111)
CO2: 23 mmol/L (ref 22–32)
Calcium: 8.3 mg/dL — ABNORMAL LOW (ref 8.9–10.3)
Creatinine, Ser: 0.83 mg/dL (ref 0.44–1.00)
GFR calc Af Amer: >60 mL/min (ref >60)
GFR calc non Af Amer: >60 mL/min (ref >60)
GLUCOSE: 127 mg/dL — AB (ref 65–99)
POTASSIUM: 3.7 mmol/L (ref 3.5–5.1)
SODIUM: 139 mmol/L (ref 135–145)

## 2016-09-23 LAB — CULTURE, BLOOD (ROUTINE X 2)
Culture: NO GROWTH
SPECIAL REQUESTS: ADEQUATE

## 2016-09-23 MED ORDER — SODIUM CHLORIDE 0.9 % IV SOLN
1.0000 g | INTRAVENOUS | Status: DC
  Filled 2016-09-23: qty 1

## 2016-09-23 MED ORDER — INSULIN GLARGINE 100 UNIT/ML SOLOSTAR PEN: 8.0000 [IU] | PEN_INJECTOR | Freq: Every day | SUBCUTANEOUS | 0 refills | Status: DC

## 2016-09-23 MED ORDER — ERTAPENEM SODIUM 1 G IJ SOLR: 1.0000 g | INTRAMUSCULAR | Status: DC

## 2016-09-23 MED ORDER — SODIUM CHLORIDE 0.9% FLUSH: 10.0000 mL | INTRAVENOUS | Status: DC | PRN

## 2016-09-23 MED ORDER — ERTAPENEM IV (FOR PTA / DISCHARGE USE ONLY): 1.0000 g | INTRAVENOUS | 0 refills | Status: DC

## 2016-09-23 MED ORDER — MEROPENEM IV (FOR PTA / DISCHARGE USE ONLY): 1.0000 g | Freq: Two times a day (BID) | INTRAVENOUS | 0 refills | Status: AC

## 2016-09-23 NOTE — Progress Notes (Signed)
Peripherally Inserted Central Catheter/Midline Placement  The IV Nurse has discussed with the patient and/or persons authorized to consent for the patient, the purpose of this procedure and the potential benefits and risks involved with this procedure.  The benefits include less needle sticks, lab draws from the catheter, and the patient may be discharged home with the catheter. Risks include, but not limited to, infection, bleeding, blood clot (thrombus formation), and puncture of an artery; nerve damage and irregular heartbeat and possibility to perform a PICC exchange if needed/ordered by physician.  Alternatives to this procedure were also discussed.  Bard Power PICC patient education guide, fact sheet on infection prevention and patient information card has been provided to patient /or left at bedside.    PICC/Midline Placement Documentation  PICC Single Lumen 09/23/16 PICC Right Brachial 37 cm (Active)       Bertha, Lokken Horton 09/23/2016, 3:59 PM

## 2016-09-23 NOTE — Progress Notes (Signed)
PHARMACY CONSULT NOTE FOR:  OUTPATIENT  PARENTERAL ANTIBIOTIC THERAPY (OPAT)  Indication: ESBL UTI Regimen: Invanz 1 gm IV q24h End date: 09/30/16  IV antibiotic discharge orders are pended. To discharging provider:  please sign these orders via discharge navigator,  Select New Orders & click on the button choice - Manage This Unsigned Work.     Thank you for allowing pharmacy to be a part of this patient's care.  Lynelle Doctor 09/23/2016, 12:08 PM

## 2016-09-23 NOTE — Progress Notes (Signed)
PHARMACY CONSULT NOTE FOR:  OUTPATIENT  PARENTERAL ANTIBIOTIC THERAPY (OPAT)  Indication: ESBL UTI Regimen: Meropenem 1g IV q12h x 10 days (per discussion with Dr. Wyline Copas, asked to change discharge orders to SNF from Ertapenem back to Meropenem) End date: 09/30/2016  IV antibiotic discharge orders are pended. To discharging provider:  please sign these orders via discharge navigator,  Select New Orders & click on the button choice - Manage This Unsigned Work.     Thank you for allowing pharmacy to be a part of this patient's care.  Luiz Ochoa 09/23/2016, 5:53 PM

## 2016-09-23 NOTE — Progress Notes (Signed)
LCSW following for d/c plan.  Pt going to [facility] room 606A. Per contact, daughter has gone to sign admission paperwork. CSW provided FL2 and d/c summary.   Once PICC line complete, will call for transport ([facility]).  Provided report [phone] to RN.   Sharren Bridge, MSW, LCSW Clinical Social Work 09/23/2016 [phone]

## 2016-09-23 NOTE — Progress Notes (Signed)
Discharge report called to [facility] at [facility]. Condition stable. Transported via [facility]. Eulas Post, RN

## 2016-09-23 NOTE — Discharge Summary (Addendum)
Physician Discharge Summary  Carrie Atkins Ala TDD:[id number] DOB: 1934/04/05 DOA: 09/17/2016  PCP: [name]., MD  Admit date: 09/17/2016 Discharge date: 09/23/2016  Admitted From: Home Disposition:  SNF  Recommendations for Outpatient Follow-up:  1. Follow up with PCP in 1-2 weeks 2. Please monitor BP, resume Valsartan as blood pressure improves 3. PICC line maintenance 4. Continue antibiotic IV through 4/18, then remove picc after completing IV antibiotic  Discharge Condition:Stable CODE STATUS:Full Diet recommendation: Diabetic   Brief/Interim Summary: Please see dictated h an p for details. Briefly, 83 y.o.femalewith medical history significant or CLL, diabetes mellitus with neuropathy, hypertension and mild LV outflow tract obstruction follows with [name], presents with elevated blood sugar in the setting of not taking her own medications at home.  She cannot explain why she stopped her medications at home.  She also has had poor p.o. intake at home.  She was admitted to stepdown due to requirements for insulin infusion, she was otherwise stable on admission, however on 4/6 in early afternoon she started becoming more and more hypotensive.  The CCM was consulted, her antibiotic regimen was broadened however eventually needed to be started on pressors.  She has improved, she is off vasopressors and her blood pressures have remained stable. She has tolerated resumption of her Coreg and was transferred to floor on 4/9.  Urine cultures speciated ESBL E. coli, and was started on meropenem on 4/9  Septic shock likely due to ESBL UTI present on admit -Cortisol was appropriately elevated at 21 -she was initially placed on broad-spectrum antibiotics with vancomycin and cefepime, blood cultures with 1 out of 2 bottles of coag negative staph, likely contaminant, discontinued vancomycin -Most likely etiology for her septic shock in her second hospital day was due to ESBL UTI.  Culture  speciated on 4/9 and she was changed to meropenem on 4/9.  PICC line was placed and plan to continue IV antibiotics through 4/18  DKA (diabetic ketoacidoses) -On admission insulin infusion, now gap closed, she was placed on sliding scale and CBGs are better controlled this morning, continue current modified diet and her home medications -Hemoglobin A1c is 12.6 -according to the daughter when the patient left the rehab facility last year, she was given Insulin at home and became confused. They attributed the confusion to the insulin and then stopped giving it. She had previously been receiving insulin at the hospital and subsequently at rehab.   A-fib with RVR -new diagnosis, CHA2DS2-VASc Score at least 5, ECHO on 4/6 showed ejection fraction of 60-65%, mild LVH and grade 1 diastolic dysfunction -TSH, free T4 normal -Cardiology following, not a good candidate for anticoagulation, medications were adjusted, currently on Coreg.  Still has runs of paroxysmal A. Fib -Cardiology has since signed off  Acute kidney injury -Likely in the setting of DKA, dehydration, rhabdomyolysis, resolved -Remains off Valsartan secondary to soft BP  Fall with mild Rhabdomyolysis -PT eval recommended SNF  UTI, leukocytosis with left shift -Antibiotics broadened 4/9 as above, now on Meropenem per above  Acute encephalopathy -due to possible underlying infectious process versus DKA, improving -CT scan and MRI of the brain negative  HTN (hypertension) -now on metoprolol -Per cardiology, continue to hold valsartan secondary to soft BP, may resume in the future if BP imrpoves  CLL (chronic lymphocytic leukemia)   Discharge Diagnoses:  Principal Problem:   DKA (diabetic ketoacidoses) ([place]) Active Problems:   HTN (hypertension)   CLL (chronic lymphocytic leukemia) ([place])   Diabetic polyneuropathy associated with type  2 diabetes mellitus ([place])   Acute encephalopathy   Acute cystitis without hematuria    AKI (acute kidney injury) ([place])   Paroxysmal atrial fibrillation (HCC)   Hypotension   Severe sepsis with septic shock (HCC)   ESBL (extended spectrum beta-lactamase) producing bacteria infection    Discharge Instructions  Discharge Instructions    Home infusion instructions [facility] May follow [place] Dosing Protocol; May administer Cathflo as needed to maintain patency of vascular access device.; Flushing of vascular access device: per [facility] Protocol: 0.9% NaCl pre/post medica...    Complete by:  As directed    Instructions:  May follow [place] Dosing Protocol   Instructions:  May administer Cathflo as needed to maintain patency of vascular access device.   Instructions:  Flushing of vascular access device: per [facility] Protocol: 0.9% NaCl pre/post medication administration and prn patency; Heparin 100 u/ml, 463m for implanted ports and Heparin 10u/ml, 573mfor all other central venous catheters.   Instructions:  May follow [facility] Anaphylaxis Protocol for First Dose Administration in the home: 0.9% NaCl at 25-50 ml/hr to maintain IV access for protocol meds. Epinephrine 0.3 ml IV/IM PRN and Benadryl 25-50 IV/IM PRN s/s of anaphylaxis.   Instructions:  AdBowienfusion Coordinator (RN) to assist per patient IV care needs in the home PRN.   Home infusion instructions [facility] May follow [place] Protocol; May administer Cathflo as needed to maintain patency of vascular access device.; Flushing of vascular access device: [facility]: 0.9% NaCl pre/post medica...    Complete by:  As directed    Instructions:  May follow [facility] [place] Protocol   Instructions:  May administer Cathflo as needed to maintain patency of vascular access device.   Instructions:  Flushing of vascular access device: [facility]: 0.9% NaCl pre/post medication administration and prn patency; Heparin 100 u/ml, 63m1mor implanted ports and Heparin 10u/ml, 63ml49mr all  other central venous catheters.   Instructions:  May follow [facility] Anaphylaxis Protocol for First Dose Administration in the home: 0.9% NaCl at 25-50 ml/hr to maintain IV access for protocol meds. Epinephrine 0.3 ml IV/IM PRN and Benadryl 25-50 IV/IM PRN s/s of anaphylaxis.   Instructions:  AdvaCraneusion Coordinator (RN) to assist per patient IV care needs in the home PRN.     Allergies as of 09/23/2016      Reactions   Sulfonamide Derivatives Nausea And Vomiting   Hydrocodone Nausea And Vomiting   Sulfa Antibiotics Nausea And Vomiting      Medication List    STOP taking these medications   amoxicillin 500 MG capsule Commonly known as:  AMOXIL   valsartan 320 MG tablet Commonly known as:  DIOVAN     TAKE these medications   aspirin EC 81 MG tablet Take 81 mg by mouth daily.   CALCIUM-MAGNESIUM-ZINC PO Take 2 tablets by mouth 2 (two) times daily.   carvedilol 25 MG tablet Commonly known as:  COREG TAKE 1 TABLET BY MOUTH TWICE DAILY   ferrous sulfate 325 (65 FE) MG tablet Take 325 mg by mouth daily with breakfast.   furosemide 40 MG tablet Commonly known as:  LASIX Take 1 tablet (40 mg total) by mouth daily.   glimepiride 4 MG tablet Commonly known as:  AMARYL Take 8 mg by mouth daily with breakfast.   Insulin Glargine 100 UNIT/ML Solostar Pen Commonly known as:  LANTUS Inject 8 Units into the skin daily at  10 pm.   meropenem IVPB Commonly known as:  MERREM Inject 1 g into the vein every 12 (twelve) hours. Indication:  ESBL UTI Next dose due: 09/23/2016 at 2200 Last Day of Therapy:  09/30/2016 Labs - Once weekly:  CBC/D and BMP, Labs - Every other week:  ESR and CRP   pioglitazone 30 MG tablet Commonly known as:  ACTOS Take 15 mg by mouth daily.   ranitidine 150 MG tablet Commonly known as:  ZANTAC Take 150 mg by mouth 2 (two) times daily as needed for heartburn.   TYLENOL ARTHRITIS PAIN 650 MG CR tablet Generic drug:  acetaminophen Take 1,300 mg by  mouth 2 (two) times daily.            Home Infusion Instuctions        Start     Ordered   09/23/16 0000  Home infusion instructions [facility] May follow [place] Dosing Protocol; May administer Cathflo as needed to maintain patency of vascular access device.; Flushing of vascular access device: per [facility] Protocol: 0.9% NaCl pre/post medica...    Question Answer Comment  Instructions May follow [place] Dosing Protocol   Instructions May administer Cathflo as needed to maintain patency of vascular access device.   Instructions Flushing of vascular access device: per [facility] Protocol: 0.9% NaCl pre/post medication administration and prn patency; Heparin 100 u/ml, 50m for implanted ports and Heparin 10u/ml, 56mfor all other central venous catheters.   Instructions May follow [facility] Anaphylaxis Protocol for First Dose Administration in the home: 0.9% NaCl at 25-50 ml/hr to maintain IV access for protocol meds. Epinephrine 0.3 ml IV/IM PRN and Benadryl 25-50 IV/IM PRN s/s of anaphylaxis.   Instructions [facility] Infusion Coordinator (RN) to assist per patient IV care needs in the home PRN.      09/23/16 1338   09/23/16 0000  Home infusion instructions [facility] May follow [place] Protocol; May administer Cathflo as needed to maintain patency of vascular access device.; Flushing of vascular access device: per [facility]: 0.9% NaCl pre/post medica...    Question Answer Comment  Instructions May follow [facility]osing Protocol   Instructions May administer Cathflo as needed to maintain patency of vascular access device.   Instructions Flushing of vascular access device: per [facility]: 0.9% NaCl pre/post medication administration and prn patency; Heparin 100 u/ml, 31m14mor implanted ports and Heparin 10u/ml, 31ml69mr all other central venous catheters.   Instructions May follow [facility] Anaphylaxis Protocol for First Dose Administration in the home: 0.9%  NaCl at 25-50 ml/hr to maintain IV access for protocol meds. Epinephrine 0.3 ml IV/IM PRN and Benadryl 25-50 IV/IM PRN s/s of anaphylaxis.   Instructions [facility] Infusion Coordinator (RN) to assist per patient IV care needs in the home PRN.      09/23/16 1753     Contact information for after-discharge care    [facility]   Specialty:  Skilled Nursing Facility Contact information: [phone]         Allergies  Allergen Reactions  . Sulfonamide Derivatives Nausea And Vomiting  . Hydrocodone Nausea And Vomiting  . Sulfa Antibiotics Nausea And Vomiting    Consultations:  Cardiology  Procedures/Studies: Dg Chest 2 View  Result Date: 09/17/2016 CLINICAL DATA:  Fall. EXAM: CHEST  2 VIEW COMPARISON:  02/04/2016 . FINDINGS: Mediastinum hilar structures  normal. Lungs are clear. Interim clearing of previously identified pulmonary interstitial edema. No prominent pleural effusion. No pneumothorax. IMPRESSION: No acute cardiopulmonary disease. Electronically Signed   By: Marcello Moores  Register   On: 09/17/2016 12:12   Ct Head Wo Contrast  Result Date: 09/17/2016 CLINICAL DATA:  Status post fall at 1 a.m. this morning. Patient found down. EXAM: CT HEAD WITHOUT CONTRAST CT CERVICAL SPINE WITHOUT CONTRAST TECHNIQUE: Multidetector CT imaging of the head and cervical spine was performed following the standard protocol without intravenous contrast. Multiplanar CT image reconstructions of the cervical spine were also generated. COMPARISON:  Head CT scan 12/31/2015 and brain MRI 01/01/2016. FINDINGS: CT HEAD FINDINGS Brain: There is some cortical atrophy and chronic microvascular ischemic change. No evidence of acute intracranial abnormality including hemorrhage, infarct, mass lesion, mass effect, midline shift or abnormal extra-axial fluid collection. No hydrocephalus or pneumocephalus.  Vascular: Atherosclerosis noted. Skull: Intact. Sinuses/Orbits: Negative. Other: None. CT CERVICAL SPINE FINDINGS Alignment: There is mild reversal the normal cervical lordosis from C2-C5. No traumatic listhesis is identified. Skull base and vertebrae: No acute fracture. No primary bone lesion or focal pathologic process. Soft tissues and spinal canal: No prevertebral fluid or swelling. No visible canal hematoma. Disc levels: Loss of disc space height appears worst at C4-5 and C6-7. Endplate spurring at O9-6 is also noted. Upper chest: Lung apices are clear. Other: None. IMPRESSION: No acute abnormality head or cervical spine. Mild atrophy and chronic microvascular ischemic change. Atherosclerosis. Cervical spondylosis. Electronically Signed   By: Inge Rise M.D.   On: 09/17/2016 12:32   Ct Cervical Spine Wo Contrast  Result Date: 09/17/2016 CLINICAL DATA:  Status post fall at 1 a.m. this morning. Patient found down. EXAM: CT HEAD WITHOUT CONTRAST CT CERVICAL SPINE WITHOUT CONTRAST TECHNIQUE: Multidetector CT imaging of the head and cervical spine was performed following the standard protocol without intravenous contrast. Multiplanar CT image reconstructions of the cervical spine were also generated. COMPARISON:  Head CT scan 12/31/2015 and brain MRI 01/01/2016. FINDINGS: CT HEAD FINDINGS Brain: There is some cortical atrophy and chronic microvascular ischemic change. No evidence of acute intracranial abnormality including hemorrhage, infarct, mass lesion, mass effect, midline shift or abnormal extra-axial fluid collection. No hydrocephalus or pneumocephalus. Vascular: Atherosclerosis noted. Skull: Intact. Sinuses/Orbits: Negative. Other: None. CT CERVICAL SPINE FINDINGS Alignment: There is mild reversal the normal cervical lordosis from C2-C5. No traumatic listhesis is identified. Skull base and vertebrae: No acute fracture. No primary bone lesion or focal pathologic process. Soft tissues and spinal canal:  No prevertebral fluid or swelling. No visible canal hematoma. Disc levels: Loss of disc space height appears worst at C4-5 and C6-7. Endplate spurring at E9-5 is also noted. Upper chest: Lung apices are clear. Other: None. IMPRESSION: No acute abnormality head or cervical spine. Mild atrophy and chronic microvascular ischemic change. Atherosclerosis. Cervical spondylosis. Electronically Signed   By: Inge Rise M.D.   On: 09/17/2016 12:32   Mr Brain Wo Contrast  Result Date: 09/18/2016 CLINICAL DATA:  Weakness.  Found on floor. EXAM: MRI HEAD WITHOUT CONTRAST TECHNIQUE: Multiplanar, multiecho pulse sequences of the brain and surrounding structures were obtained without intravenous contrast. COMPARISON:  CT head 09/17/2016 FINDINGS: Brain: Negative for acute infarct. Mild chronic changes in the white matter. Generalized atrophy without hydrocephalus. Negative for hemorrhage or mass lesion. Vascular: Normal arterial flow void. Skull and upper cervical spine: Negative Sinuses/Orbits: Negative Other: None IMPRESSION: No acute abnormality. Atrophy and mild chronic white matter ischemia. Electronically Signed   By: Juanda Crumble  Carlis Abbott M.D.   On: 09/18/2016 08:58    Subjective: No complaints today  Discharge Exam: Vitals:   09/23/16 0639 09/23/16 1520  BP: 126/89 (!) 129/53  Pulse: 62 77  Resp: 20 18  Temp: 97.7 F (36.5 C) 97.8 F (36.6 C)   Vitals:   09/22/16 1255 09/22/16 2025 09/23/16 0639 09/23/16 1520  BP: (!) 116/46 113/60 126/89 (!) 129/53  Pulse: 64 72 62 77  Resp: _0 Temp: 98.4 F (36.9 C) 98 F (36.7 C) 97.7 F (36.5 C) 97.8 F (36.6 C)  TempSrc: Oral Oral Oral Oral  SpO2: 100% 100% 100% 100%  Weight:      Height:        General: Pt is alert, awake, not in acute distress Cardiovascular: RRR, S1/S2 +, no rubs, no gallops Respiratory: CTA bilaterally, no wheezing, no rhonchi Abdominal: Soft, NT, ND, bowel sounds + Extremities: no edema, no cyanosis   The results  of significant diagnostics from this hospitalization (including imaging, microbiology, ancillary and laboratory) are listed below for reference.     Microbiology: Recent Results (from the past 240 hour(s))  Urine culture     Status: Abnormal   Collection Time: 09/17/16 12:23 PM  Result Value Ref Range Status   Specimen Description URINE, RANDOM  Final   Special Requests NONE  Final   Culture MULTIPLE SPECIES PRESENT, SUGGEST RECOLLECTION (A)  Final   Report Status 09/18/2016 FINAL  Final  MRSA PCR Screening     Status: None   Collection Time: 09/17/16  5:16 PM  Result Value Ref Range Status   MRSA by PCR NEGATIVE NEGATIVE Final    Comment:        The GeneXpert MRSA Assay (FDA approved for NASAL specimens only), is one component of a comprehensive MRSA colonization surveillance program. It is not intended to diagnose MRSA infection nor to guide or monitor treatment for MRSA infections.   Culture, blood (Routine X 2) w Reflex to ID Panel     Status: Abnormal   Collection Time: 09/18/16  6:42 PM  Result Value Ref Range Status   Specimen Description BLOOD RIGHT HAND  Final   Special Requests IN PEDIATRIC BOTTLE Blood Culture adequate volume  Final   Culture  Setup Time   Final    GRAM POSITIVE COCCI IN CLUSTERS IN PEDIATRIC BOTTLE CRITICAL RESULT CALLED TO, READ BACK BY AND VERIFIED WITH: D. WOFFORD, PHARM AT [facility], 09/19/16 AT 2028 BY J FUDESCO    Culture (A)  Final    STAPHYLOCOCCUS SPECIES (COAGULASE NEGATIVE) THE SIGNIFICANCE OF ISOLATING THIS ORGANISM FROM A SINGLE SET OF BLOOD CULTURES WHEN MULTIPLE SETS ARE DRAWN IS UNCERTAIN. PLEASE NOTIFY THE MICROBIOLOGY DEPARTMENT WITHIN ONE WEEK IF SPECIATION AND SENSITIVITIES ARE REQUIRED. Performed at [facility], [place] [address], [place], [place] [address]    Report Status 09/21/2016 FINAL  Final  Blood Culture ID Panel (Reflexed)     Status: Abnormal   Collection Time: 09/18/16  6:42 PM  Result Value Ref Range Status    Enterococcus species NOT DETECTED NOT DETECTED Final   Listeria monocytogenes NOT DETECTED NOT DETECTED Final   Staphylococcus species DETECTED (A) NOT DETECTED Final    Comment: Methicillin (oxacillin) resistant coagulase negative staphylococcus. Possible blood culture contaminant (unless isolated from more than one blood culture draw or clinical case suggests pathogenicity). No antibiotic treatment is indicated for blood  culture contaminants. CRITICAL RESULT CALLED TO, READ BACK BY AND VERIFIED WITH: D WOFFORD,PHARMD  AT 2028 09/19/16 BY J FUDESCO    Staphylococcus aureus NOT DETECTED NOT DETECTED Final   Methicillin resistance DETECTED (A) NOT DETECTED Final    Comment: CRITICAL RESULT CALLED TO, READ BACK BY AND VERIFIED WITH: D WOFFORD, PHARM AT [facility], 09/19/16 AT 2028 BY J FUDESCO    Streptococcus species NOT DETECTED NOT DETECTED Final   Streptococcus agalactiae NOT DETECTED NOT DETECTED Final   Streptococcus pneumoniae NOT DETECTED NOT DETECTED Final   Streptococcus pyogenes NOT DETECTED NOT DETECTED Final   Acinetobacter baumannii NOT DETECTED NOT DETECTED Final   Enterobacteriaceae species NOT DETECTED NOT DETECTED Final   Enterobacter cloacae complex NOT DETECTED NOT DETECTED Final   Escherichia coli NOT DETECTED NOT DETECTED Final   Klebsiella oxytoca NOT DETECTED NOT DETECTED Final   Klebsiella pneumoniae NOT DETECTED NOT DETECTED Final   Proteus species NOT DETECTED NOT DETECTED Final   Serratia marcescens NOT DETECTED NOT DETECTED Final   Haemophilus influenzae NOT DETECTED NOT DETECTED Final   Neisseria meningitidis NOT DETECTED NOT DETECTED Final   Pseudomonas aeruginosa NOT DETECTED NOT DETECTED Final   Candida albicans NOT DETECTED NOT DETECTED Final   Candida glabrata NOT DETECTED NOT DETECTED Final   Candida krusei NOT DETECTED NOT DETECTED Final   Candida parapsilosis NOT DETECTED NOT DETECTED Final   Candida tropicalis NOT DETECTED NOT DETECTED Final    Comment:  Performed at [facility], [place]. [address], [place], [place] [address]  Culture, blood (Routine X 2) w Reflex to ID Panel     Status: None   Collection Time: 09/18/16  7:51 PM  Result Value Ref Range Status   Specimen Description BLOOD RIGHT HAND  Final   Special Requests IN PEDIATRIC BOTTLE Blood Culture adequate volume  Final   Culture   Final    NO GROWTH 5 DAYS Performed at [facility], [place] [address] [place], [place] [address]    Report Status 09/23/2016 FINAL  Final  Urine culture     Status: Abnormal   Collection Time: 09/19/16 11:30 AM  Result Value Ref Range Status   Specimen Description URINE, CATHETERIZED  Final   Special Requests NONE  Final   Culture (A)  Final    10,000 COLONIES/mL ESCHERICHIA COLI Confirmed Extended Spectrum Beta-Lactamase Producer (ESBL) Performed at [facility], [place] [address], [place],  [address]    Report Status 09/21/2016 FINAL  Final   Organism ID, Bacteria ESCHERICHIA COLI (A)  Final      Susceptibility   Escherichia coli - MIC*    AMPICILLIN >=32 RESISTANT Resistant     CEFAZOLIN >=64 RESISTANT Resistant     CEFTRIAXONE >=64 RESISTANT Resistant     CIPROFLOXACIN >=4 RESISTANT Resistant     GENTAMICIN >=16 RESISTANT Resistant     IMIPENEM <=0.25 SENSITIVE Sensitive     NITROFURANTOIN <=16 SENSITIVE Sensitive     TRIMETH/SULFA >=320 RESISTANT Resistant     AMPICILLIN/SULBACTAM >=32 RESISTANT Resistant     PIP/TAZO <=4 SENSITIVE Sensitive     Extended ESBL POSITIVE Resistant     * 10,000 COLONIES/mL ESCHERICHIA COLI     Labs: BNP (last 3 results) No results for input(s): BNP in the last 8760 hours. Basic Metabolic Panel:  Recent Labs Lab 09/19/16 0332 09/20/16 0620 09/21/16 0339 09/22/16 0525 09/23/16 0548  NA 138 140 138 139 139  K 3.2* 2.7* 3.9 3.9 3.7  CL 113* 117* 114* 112* 110  CO2 18* 18* 17* 19* 23  GLUCOSE  97 111* 196* 132* 127*  BUN 31* _0 CREATININE 0.99 0.78 0.85 0.77 0.83   CALCIUM 7.3* 7.2* 7.9* 8.3* 8.3*   Liver Function Tests:  Recent Labs Lab 09/17/16 1222 09/20/16 0620  AST 29 23  ALT 21 19  ALKPHOS 95 41  BILITOT 1.3* 0.6  PROT 8.0 4.6*  ALBUMIN 4.2 2.2*   No results for input(s): LIPASE, AMYLASE in the last 168 hours. No results for input(s): AMMONIA in the last 168 hours. CBC:  Recent Labs Lab 09/17/16 1222  09/19/16 0332 09/20/16 0342 09/21/16 0339 09/22/16 0525 09/23/16 0548  WBC 36.6*  < > 21.0* 9.1 10.3 11.7* 10.8*  NEUTROABS 22.7*  --   --   --   --   --   --   HGB 13.2  < > 8.8* 7.9* 9.7* 9.7* 10.2*  HCT 38.4  < > 25.9* 22.9* 29.6* 28.9* 30.6*  MCV 92.3  < > 93.5 90.5 91.6 93.5 96.2  PLT 346  < > 214 146* 151 167 176  < > = values in this interval not displayed. Cardiac Enzymes:  Recent Labs Lab 09/17/16 1329 09/18/16 0402  CKTOTAL 500* 269*   BNP: Invalid input(s): POCBNP CBG:  Recent Labs Lab 09/22/16 1141 09/22/16 1653 09/22/16 2023 09/23/16 0808 09/23/16 1202  GLUCAP 242* 212* 184* 162* 242*   D-Dimer No results for input(s): DDIMER in the last 72 hours. Hgb A1c No results for input(s): HGBA1C in the last 72 hours. Lipid Profile No results for input(s): CHOL, HDL, LDLCALC, TRIG, CHOLHDL, LDLDIRECT in the last 72 hours. Thyroid function studies No results for input(s): TSH, T4TOTAL, T3FREE, THYROIDAB in the last 72 hours.  Invalid input(s): FREET3 Anemia work up No results for input(s): VITAMINB12, FOLATE, FERRITIN, TIBC, IRON, RETICCTPCT in the last 72 hours. Urinalysis    Component Value Date/Time   COLORURINE STRAW (A) 09/17/2016 1222   APPEARANCEUR CLEAR 09/17/2016 1222   LABSPEC 1.007 09/17/2016 1222   PHURINE 5.0 09/17/2016 1222   GLUCOSEU >=500 (A) 09/17/2016 1222   HGBUR MODERATE (A) 09/17/2016 1222   BILIRUBINUR NEGATIVE 09/17/2016 1222   KETONESUR 5 (A) 09/17/2016 1222   PROTEINUR NEGATIVE 09/17/2016 1222   NITRITE POSITIVE (A) 09/17/2016 1222   LEUKOCYTESUR SMALL (A) 09/17/2016  1222   Sepsis Labs Invalid input(s): PROCALCITONIN,  WBC,  LACTICIDVEN Microbiology Recent Results (from the past 240 hour(s))  Urine culture     Status: Abnormal   Collection Time: 09/17/16 12:23 PM  Result Value Ref Range Status   Specimen Description URINE, RANDOM  Final   Special Requests NONE  Final   Culture MULTIPLE SPECIES PRESENT, SUGGEST RECOLLECTION (A)  Final   Report Status 09/18/2016 FINAL  Final  MRSA PCR Screening     Status: None   Collection Time: 09/17/16  5:16 PM  Result Value Ref Range Status   MRSA by PCR NEGATIVE NEGATIVE Final    Comment:        The GeneXpert MRSA Assay (FDA approved for NASAL specimens only), is one component of a comprehensive MRSA colonization surveillance program. It is not intended to diagnose MRSA infection nor to guide or monitor treatment for MRSA infections.   Culture, blood (Routine X 2) w Reflex to ID Panel     Status: Abnormal   Collection Time: 09/18/16  6:42 PM  Result Value Ref Range Status   Specimen Description BLOOD RIGHT HAND  Final   Special Requests IN PEDIATRIC BOTTLE Blood Culture adequate volume  Final   Culture  Setup Time   Final    GRAM POSITIVE COCCI IN CLUSTERS IN PEDIATRIC BOTTLE CRITICAL RESULT CALLED TO, READ BACK BY AND VERIFIED WITH: D. WOFFORD, PHARM AT [facility], 09/19/16 AT 2028 BY J FUDESCO    Culture (A)  Final    STAPHYLOCOCCUS SPECIES (COAGULASE NEGATIVE) THE SIGNIFICANCE OF ISOLATING THIS ORGANISM FROM A SINGLE SET OF BLOOD CULTURES WHEN MULTIPLE SETS ARE DRAWN IS UNCERTAIN. PLEASE NOTIFY THE MICROBIOLOGY DEPARTMENT WITHIN ONE WEEK IF SPECIATION AND SENSITIVITIES ARE REQUIRED. Performed at [facility], [place] [address], [place], [place] [address]    Report Status 09/21/2016 FINAL  Final  Blood Culture ID Panel (Reflexed)     Status: Abnormal   Collection Time: 09/18/16  6:42 PM  Result Value Ref Range Status   Enterococcus species NOT DETECTED NOT DETECTED Final   Listeria monocytogenes NOT  DETECTED NOT DETECTED Final   Staphylococcus species DETECTED (A) NOT DETECTED Final    Comment: Methicillin (oxacillin) resistant coagulase negative staphylococcus. Possible blood culture contaminant (unless isolated from more than one blood culture draw or clinical case suggests pathogenicity). No antibiotic treatment is indicated for blood  culture contaminants. CRITICAL RESULT CALLED TO, READ BACK BY AND VERIFIED WITH: D WOFFORD,PHARMD AT 2028 09/19/16 BY J FUDESCO    Staphylococcus aureus NOT DETECTED NOT DETECTED Final   Methicillin resistance DETECTED (A) NOT DETECTED Final    Comment: CRITICAL RESULT CALLED TO, READ BACK BY AND VERIFIED WITH: D WOFFORD, PHARM AT [facility], 09/19/16 AT 2028 BY J FUDESCO    Streptococcus species NOT DETECTED NOT DETECTED Final   Streptococcus agalactiae NOT DETECTED NOT DETECTED Final   Streptococcus pneumoniae NOT DETECTED NOT DETECTED Final   Streptococcus pyogenes NOT DETECTED NOT DETECTED Final   Acinetobacter baumannii NOT DETECTED NOT DETECTED Final   Enterobacteriaceae species NOT DETECTED NOT DETECTED Final   Enterobacter cloacae complex NOT DETECTED NOT DETECTED Final   Escherichia coli NOT DETECTED NOT DETECTED Final   Klebsiella oxytoca NOT DETECTED NOT DETECTED Final   Klebsiella pneumoniae NOT DETECTED NOT DETECTED Final   Proteus species NOT DETECTED NOT DETECTED Final   Serratia marcescens NOT DETECTED NOT DETECTED Final   Haemophilus influenzae NOT DETECTED NOT DETECTED Final   Neisseria meningitidis NOT DETECTED NOT DETECTED Final   Pseudomonas aeruginosa NOT DETECTED NOT DETECTED Final   Candida albicans NOT DETECTED NOT DETECTED Final   Candida glabrata NOT DETECTED NOT DETECTED Final   Candida krusei NOT DETECTED NOT DETECTED Final   Candida parapsilosis NOT DETECTED NOT DETECTED Final   Candida tropicalis NOT DETECTED NOT DETECTED Final    Comment: Performed at [facility], [place]. [address], [place], [place] [address]  Culture,  blood (Routine X 2) w Reflex to ID Panel     Status: None   Collection Time: 09/18/16  7:51 PM  Result Value Ref Range Status   Specimen Description BLOOD RIGHT HAND  Final   Special Requests IN PEDIATRIC BOTTLE Blood Culture adequate volume  Final   Culture   Final    NO GROWTH 5 DAYS Performed at [facility], [place] [address], [place], [place] [address]    Report Status 09/23/2016 FINAL  Final  Urine culture     Status: Abnormal   Collection Time: 09/19/16 11:30 AM  Result Value Ref Range Status   Specimen Description URINE, CATHETERIZED  Final   Special Requests NONE  Final   Culture (A)  Final  10,000 COLONIES/mL ESCHERICHIA COLI Confirmed Extended Spectrum Beta-Lactamase Producer (ESBL) Performed at [facility], [place] [address], [place], [place] [address]    Report Status 09/21/2016 FINAL  Final   Organism ID, Bacteria ESCHERICHIA COLI (A)  Final      Susceptibility   Escherichia coli - MIC*    AMPICILLIN >=32 RESISTANT Resistant     CEFAZOLIN >=64 RESISTANT Resistant     CEFTRIAXONE >=64 RESISTANT Resistant     CIPROFLOXACIN >=4 RESISTANT Resistant     GENTAMICIN >=16 RESISTANT Resistant     IMIPENEM <=0.25 SENSITIVE Sensitive     NITROFURANTOIN <=16 SENSITIVE Sensitive     TRIMETH/SULFA >=320 RESISTANT Resistant     AMPICILLIN/SULBACTAM >=32 RESISTANT Resistant     PIP/TAZO <=4 SENSITIVE Sensitive     Extended ESBL POSITIVE Resistant     * 10,000 COLONIES/mL ESCHERICHIA COLI     SIGNED:   Donne Hazel, MD  [facility] 09/23/2016, 5:54 PM  If 7PM-7AM, please contact night-coverage [email or web address] Password [email or web address]

## 2016-09-23 NOTE — Progress Notes (Addendum)
Nutrition Follow-up  DOCUMENTATION CODES:   Not applicable  INTERVENTION:   Premier Protein BID, each supplement provides 160kcal and 30g protein.   MVI  NUTRITION DIAGNOSIS:   Inadequate oral intake related to acute illness, chronic illness, vomiting, nausea as evidenced by per patient/family report. Resolved   GOAL:   Patient will meet greater than or equal to 90% of their needs  MONITOR:   PO intake, Supplement acceptance, Weight trends, Labs  ASSESSMENT:   83 y.o. female with medical history significant or CLL, diabetes mellitus with neuropathy, hypertension and mild LV outflow tract obstruction follows with Dr. Nahser resents with elevated blood sugar as mentioned above. The patient's daughter gives most of the history. The patient lives in an apartment which is an addition on the daughter's home. She states that she noted that her mother had not taken pills since Sunday when she looked at her pill box. She took her to see her PCP yesterday for this reason and gave her Actos and Amaryl last night. The patient fell on the floor last night and was found there by her daughter today.    Met with pt in room today. Pt continues to do well and eating 85% meals. Pt denies nausea and vomiting today. Pt drinking one Premier Protein a day. No new weight since admit.   Medications reviewed and include: aspirin, lovenox, pepcid, ferrous sulfate, insulin, MVI, senokot  Labs reviewed: Ca 8.3(L) cbgs- 10.8(H) cbgs- 132, 127 x 24hrs AIC 12.6 (H) 4/6  Nutrition-Focused physical exam completed. Findings are no fat depletion, no muscle depletion, and no edema.   Diet Order:  Diet Carb Modified Fluid consistency: Thin; Room service appropriate? Yes  Skin:  Reviewed, no issues  Last BM:  4/10  Height:   Ht Readings from Last 1 Encounters:  09/17/16 5' 1" (1.549 m)    Weight:   Wt Readings from Last 1 Encounters:  09/17/16 146 lb 6 oz (66.4 kg)    Ideal Body Weight:  47.73  kg  BMI:  Body mass index is 27.66 kg/m.  Estimated Nutritional Needs:   Kcal:  1530-1725 (23-26 kcal/kg)  Protein:  65-80 grams (1-1.2 grams/kg)  Fluid:  >/= 1.6 L/day  EDUCATION NEEDS:   No education needs identified at this time   , RD, LDN Pager #- [phone]  

## 2016-09-23 NOTE — Progress Notes (Signed)
Progress Note  Patient Name: Carrie Atkins Date of Encounter: 09/23/2016  Primary Cardiologist: [name]  Subjective   Reports feeling well today and eating breakfast during interview., we discussed risk vs benefits of starting blood thinners and she agrees its best not to start on anticoagulation and understands the risks. No SOB or CP.   Inpatient Medications    Scheduled Meds: . aspirin EC  81 mg Oral Daily  . carvedilol  3.125 mg Oral BID WC  . enoxaparin (LOVENOX) injection  40 mg Subcutaneous Q24H  . famotidine  20 mg Oral Daily  . ferrous sulfate  325 mg Oral Q breakfast  . glimepiride  8 mg Oral Q breakfast  . insulin aspart  0-15 Units Subcutaneous TID WC  . insulin glargine  8 Units Subcutaneous QHS  . meropenem (MERREM) IV  1 g Intravenous Q12H  . multivitamin with minerals  1 tablet Oral Daily  . pioglitazone  15 mg Oral Daily  . protein supplement shake  11 oz Oral BID BM  . senna  1 tablet Oral BID  . sodium chloride flush  3 mL Intravenous Q12H   Continuous Infusions: . phenylephrine (NEO-SYNEPHRINE) Adult infusion Stopped (09/19/16 1343)   PRN Meds: acetaminophen **OR** acetaminophen, calcium carbonate, lip balm, ondansetron **OR** ondansetron (ZOFRAN) IV, polyvinyl alcohol, simethicone   Vital Signs    Vitals:   09/22/16 0700 09/22/16 1255 09/22/16 2025 09/23/16 0639  BP: 130/68 (!) 116/46 113/60 126/89  Pulse: 96 64 72 62  Resp: 18 18 20 20   Temp: 97.9 F (36.6 C) 98.4 F (36.9 C) 98 F (36.7 C) 97.7 F (36.5 C)  TempSrc: Oral Oral Oral Oral  SpO2: 100% 100% 100% 100%  Weight:      Height:        Intake/Output Summary (Last 24 hours) at 09/23/16 0745 Last data filed at 09/22/16 1800  Gross per 24 hour  Intake              740 ml  Output              300 ml  Net              440 ml   Filed Weights   09/17/16 1053  Weight: 146 lb 6 oz (66.4 kg)    Telemetry    Predominantly NSR with periods of PAF with RVR, PACs,  and periods  of tachycardia- Personally Reviewed  ECG    09/21/16- NSR, SB-HR 58 with PACs- Personally Reviewed  Physical Exam   GEN: No acute distress.   Neck: No JVD Cardiac: RRR, no murmurs, rubs, or gallops.  Respiratory: Clear to auscultation bilaterally.   GI: Soft, nontender, non-distended  MS: No edema; No deformity. Neuro:  Nonfocal  Psych: Normal affect   Labs    Chemistry Recent Labs Lab 09/17/16 1222  09/20/16 0620 09/21/16 0339 09/22/16 0525 09/23/16 0548  NA 132*  < > 140 138 139 139  K 4.1  < > 2.7* 3.9 3.9 3.7  CL 92*  < > 117* 114* 112* 110  CO2 15*  < > 18* 17* 19* 23  GLUCOSE 598*  < > 111* 196* 132* 127*  BUN 72*  < > 15 13 11 13   CREATININE 1.92*  < > 0.78 0.85 0.77 0.83  CALCIUM 9.8  < > 7.2* 7.9* 8.3* 8.3*  PROT 8.0  --  4.6*  --   --   --   ALBUMIN 4.2  --  2.2*  --   --   --   AST 29  --  23  --   --   --   ALT 21  --  19  --   --   --   ALKPHOS 95  --  41  --   --   --   BILITOT 1.3*  --  0.6  --   --   --   GFRNONAA 23*  < > >60 >60 >60 >60  GFRAA 27*  < > >60 >60 >60 >60  ANIONGAP 25*  < > 5 7 8 6   < > = values in this interval not displayed.   Hematology Recent Labs Lab 09/21/16 0339 09/22/16 0525 09/23/16 0548  WBC 10.3 11.7* 10.8*  RBC 3.23* 3.09* 3.18*  HGB 9.7* 9.7* 10.2*  HCT 29.6* 28.9* 30.6*  MCV 91.6 93.5 96.2  MCH 30.0 31.4 32.1  MCHC 32.8 33.6 33.3  RDW 13.6 14.0 14.3  PLT 151 167 176    Radiology    CXR 09/17/16 CHEST 2 VIEW  COMPARISON: 02/04/2016 .  FINDINGS: Mediastinum hilar structures normal. Lungs are clear. Interim clearing of previously identified pulmonary interstitial edema. No prominent pleural effusion. No pneumothorax.  IMPRESSION: No acute cardiopulmonary disease.   Cardiac Studies   Echo 09/18/16 Study Conclusions  - Left ventricle: The cavity size was normal. Wall thickness was increased in a pattern of mild LVH. Systolic function was normal. The estimated ejection fraction was in the  range of 60% to 65%. Wall motion was normal; there were no regional wall motion abnormalities. Doppler parameters are consistent with abnormal left ventricular relaxation (grade 1 diastolic dysfunction). - Left atrium: The atrium was mildly dilated. - Atrial septum: No defect or patent foramen ovale was identified. - Pulmonary arteries: PA peak pressure: 37 mm Hg (S).  Patient Profile     83 y.o. female followed by Dr Acie Fredrickson with ahistory significant for mild LV outflow tract obstruction, HTN, CLL and Diabetes Type 2.The patient hadfallen and was found on the floor in the morning by her daughter who also noted that the patient had not taken her meds since Sunday. In ED she had DKIA, acute renal insufficiency, hypotension requiring IV pressors, and AF with RVR (new). She is suspected to have a UTI that may have triggered this episode.    Assessment & Plan    Atrial fibrillation  -Has history of mild LV outflow tract obstruction. EF 55-60%. No history of afib. -She was noted to be tachycardiac with rates in the 130's in the ED. EKG shows atrial fibrillation with rate controlled. HR slowed with IV metoprolol and now sinus rhythm with intermittent afib in the low 100's. -TSH 1.285 -CHA2DS2/VAS Stroke Risk Score 6 (CHF, HTN, Age (2), DM, female) . Pt has frequent falls,confusion, and anemia. She may not be a good candidate for anticoagulation. Fall risk being assessed with PT per IM. If pt going back home she would be at increased risk for bleeding and at possible not taking her meds safely.  -This is likely reactionary in setting of UTI, dehydration, rhabdo after a fall. -Continue Coreg -DKA has resolved -on IV abx for treatment of UTI  Dehydration -Lasix stopped and pt being hydrated. I/O + 11.4 L  Fall with rhabdomyolysis -CK 500--> 269, SCr 1.25-0.83 -Treated with IV fluids.  HTN -At home on carvedilol and valsartan -Currently BP 130/68 - ARB held on admission due to  acute renal insufficiency and hypotension, her blood pressure  is   Renal insufficiency -SCr 1.92--->0.77. Her SCr in 06/2015 was normal <1 -Likely related to dehydration and her recent fall and not eating and drinking at home.   DM Type 2 with DKA -Glucose level >700 PTA and 598 on arrival to ED. Management by IM  UTI, leukocytosis with left shift - Managed by IM   Plan- To discuss with MD: Per chart review it appears pt is scheduled to be discharged today to [facility]. PT evaluation resulted in marked weakness, no anticoagulation recommended for PAF due to risk of fall. Her lasix has been discontinued during stay while being rehydrated, recommend restarting her back on low PO dose at discharge. Her ARB has been withheld due to AKI and dehydration, her BP is too low to restart right now-- consider restarting ARB back at a follow-up visit for now.   Kristopher Glee, PA-C  09/23/2016, 7:45 AM    Attending Note:   The patient was seen and examined.  Agree with assessment and plan as noted above.  Changes made to the above note as needed.  Patient seen and independently examined with Delos Haring, PA .   We discussed all aspects of the encounter. I agree with the assessment and plan as stated above.  1. Atrial fib:   Patient has a hx of frequent falls and is at high risk for bleeding issues. Agree with assessment by physical therapy to see how steady she is. If she becomes more steady, I would agree to [address].   2. HTN :  BP is good. Will wait and start Valsartan if her BP increases  She is stable . Will sign off.  Call for questions     I have spent a total of 40 minutes with patient reviewing hospital  notes , telemetry, EKGs, labs and examining patient as well as establishing an assessment and plan that was discussed with the patient. > 50% of time was spent in direct patient care.     Thayer Headings, Brooke Bonito., MD, [facility] 09/23/2016, 12:31 PM [address],  [place] Pager [phone]

## 2016-09-23 NOTE — Progress Notes (Signed)
Physical Therapy Treatment Patient Details Name: Carrie Atkins MRN: [id number] DOB: [birth year] Today's Date: 09/23/2016    History of Present Illness 83 y.o. female with medical history significant for CLL, diabetes mellitus with neuropathy, hypertension and mild LV outflow tract obstruction and admitted for DKA, concern for sepsis, acute encephalopathy    PT Comments    Assisted OOB to [facility], assisted with hygiene then amb twice in hallway with walker + 2 assist for safety.    Follow Up Recommendations  SNF ([facility])     Equipment Recommendations       Recommendations for Other Services       Precautions / Restrictions Precautions Precautions: Fall Precaution Comments: monitor VS, DM Restrictions Weight Bearing Restrictions: No    Mobility  Bed Mobility Overal bed mobility: Needs Assistance Bed Mobility: Supine to Sit;Sit to Supine     Supine to sit: Min assist;Min guard Sit to supine: Min assist   General bed mobility comments: assisted out of bed with use of rail and HOB elevated.  Increased assist back to bed for b LE's.  Transfers Overall transfer level: Needs assistance Equipment used: None;Rolling walker (2 wheeled) Transfers: Sit to/from [employer] Transfers   Stand pivot transfers: Min assist;Mod assist       General transfer comment: 50% VC's on proper hand placement and turn completion from elevated bed to [facility].  Increased assist from sit to stand from lower BSC level.    Ambulation/Gait Ambulation/Gait assistance: Min assist;+2 safety/equipment Ambulation Distance (Feet): 40 Feet (20 feet x 2 one sitting rest break) Assistive device: Rolling walker (2 wheeled) Gait Pattern/deviations: Step-to pattern;Step-through pattern;Decreased step length - right;Decreased step length - left;Trunk flexed;Shuffle Gait velocity: Decreased   General Gait Details: tolerated an increased distance.  HR increased from 84 to 109.  + 2 for safety such that  recliner was following.  Unsteady gait.  C/o B LE weakness.     Stairs            Wheelchair Mobility    Modified Rankin (Stroke Patients Only)       Balance                                            Cognition Arousal/Alertness: Awake/alert Behavior During Therapy: WFL for tasks assessed/performed Overall Cognitive Status: Within Functional Limits for tasks assessed                                 General Comments: pt aware of her current situation and remembered me from 2 days ago      Exercises      General Comments        Pertinent Vitals/Pain Pain Assessment: No/denies pain    Home Living                      Prior Function            PT Goals (current goals can now be found in the care plan section) Progress towards PT goals: Progressing toward goals    Frequency    Min 3X/week      PT Plan Current plan remains appropriate    Co-evaluation             End of Session Equipment Utilized During Treatment: Gait belt  Activity Tolerance: Patient limited by fatigue Patient left: in bed;with bed alarm set;with call bell/phone within reach Nurse Communication:  (pt requesting her eye drops for dry eye) PT Visit Diagnosis: Difficulty in walking, not elsewhere classified (R26.2);Muscle weakness (generalized) (M62.81)     Time: 7255-0016 PT Time Calculation (min) (ACUTE ONLY): 28 min  Charges:  $Gait Training: 8-22 mins $Therapeutic Activity: 8-22 mins                    G Codes:       Carrie Atkins  PTA [facility]  Acute  Rehab Pager      [phone]

## 2016-11-26 ENCOUNTER — Other Ambulatory Visit: Payer: Self-pay | Admitting: Cardiovascular Disease

## 2016-11-30 ENCOUNTER — Emergency Department (HOSPITAL_COMMUNITY): Payer: Medicare Other

## 2016-11-30 ENCOUNTER — Inpatient Hospital Stay (HOSPITAL_COMMUNITY)
Admission: EM | Admit: 2016-11-30 | Discharge: 2016-12-09 | DRG: 871 | Disposition: A | Discharge status: Skilled Nursing Facility | Payer: Medicare Other | Attending: Internal Medicine | Admitting: Internal Medicine

## 2016-11-30 ENCOUNTER — Encounter (HOSPITAL_COMMUNITY): Payer: Self-pay | Admitting: Emergency Medicine

## 2016-11-30 DIAGNOSIS — Z882 Allergy status to sulfonamides status: Secondary | ICD-10-CM

## 2016-11-30 DIAGNOSIS — Z7982 Long term (current) use of aspirin: Secondary | ICD-10-CM

## 2016-11-30 DIAGNOSIS — E872 Acidosis, unspecified: Secondary | ICD-10-CM

## 2016-11-30 DIAGNOSIS — Z9049 Acquired absence of other specified parts of digestive tract: Secondary | ICD-10-CM

## 2016-11-30 DIAGNOSIS — R6521 Severe sepsis with septic shock: Secondary | ICD-10-CM | POA: Diagnosis present

## 2016-11-30 DIAGNOSIS — E785 Hyperlipidemia, unspecified: Secondary | ICD-10-CM | POA: Diagnosis present

## 2016-11-30 DIAGNOSIS — K219 Gastro-esophageal reflux disease without esophagitis: Secondary | ICD-10-CM | POA: Diagnosis present

## 2016-11-30 DIAGNOSIS — F0390 Unspecified dementia without behavioral disturbance: Secondary | ICD-10-CM | POA: Diagnosis present

## 2016-11-30 DIAGNOSIS — Z8679 Personal history of other diseases of the circulatory system: Secondary | ICD-10-CM

## 2016-11-30 DIAGNOSIS — Z888 Allergy status to other drugs, medicaments and biological substances status: Secondary | ICD-10-CM

## 2016-11-30 DIAGNOSIS — A419 Sepsis, unspecified organism: Secondary | ICD-10-CM | POA: Diagnosis not present

## 2016-11-30 DIAGNOSIS — R7881 Bacteremia: Secondary | ICD-10-CM

## 2016-11-30 DIAGNOSIS — Z6834 Body mass index (BMI) 34.0-34.9, adult: Secondary | ICD-10-CM

## 2016-11-30 DIAGNOSIS — R197 Diarrhea, unspecified: Secondary | ICD-10-CM | POA: Diagnosis present

## 2016-11-30 DIAGNOSIS — G9340 Encephalopathy, unspecified: Secondary | ICD-10-CM | POA: Diagnosis present

## 2016-11-30 DIAGNOSIS — E11649 Type 2 diabetes mellitus with hypoglycemia without coma: Secondary | ICD-10-CM | POA: Diagnosis present

## 2016-11-30 DIAGNOSIS — R0602 Shortness of breath: Secondary | ICD-10-CM

## 2016-11-30 DIAGNOSIS — N390 Urinary tract infection, site not specified: Secondary | ICD-10-CM | POA: Diagnosis present

## 2016-11-30 DIAGNOSIS — R062 Wheezing: Secondary | ICD-10-CM

## 2016-11-30 DIAGNOSIS — E1142 Type 2 diabetes mellitus with diabetic polyneuropathy: Secondary | ICD-10-CM | POA: Diagnosis present

## 2016-11-30 DIAGNOSIS — Z1612 Extended spectrum beta lactamase (ESBL) resistance: Secondary | ICD-10-CM | POA: Diagnosis present

## 2016-11-30 DIAGNOSIS — B964 Proteus (mirabilis) (morganii) as the cause of diseases classified elsewhere: Secondary | ICD-10-CM | POA: Diagnosis present

## 2016-11-30 DIAGNOSIS — A4151 Sepsis due to Escherichia coli [E. coli]: Secondary | ICD-10-CM | POA: Diagnosis present

## 2016-11-30 DIAGNOSIS — I480 Paroxysmal atrial fibrillation: Secondary | ICD-10-CM | POA: Diagnosis present

## 2016-11-30 DIAGNOSIS — Z96659 Presence of unspecified artificial knee joint: Secondary | ICD-10-CM | POA: Diagnosis present

## 2016-11-30 DIAGNOSIS — D630 Anemia in neoplastic disease: Secondary | ICD-10-CM | POA: Diagnosis present

## 2016-11-30 DIAGNOSIS — Z79899 Other long term (current) drug therapy: Secondary | ICD-10-CM

## 2016-11-30 DIAGNOSIS — Z66 Do not resuscitate: Secondary | ICD-10-CM | POA: Diagnosis present

## 2016-11-30 DIAGNOSIS — F329 Major depressive disorder, single episode, unspecified: Secondary | ICD-10-CM | POA: Diagnosis present

## 2016-11-30 DIAGNOSIS — E669 Obesity, unspecified: Secondary | ICD-10-CM | POA: Diagnosis present

## 2016-11-30 DIAGNOSIS — N179 Acute kidney failure, unspecified: Secondary | ICD-10-CM | POA: Diagnosis present

## 2016-11-30 DIAGNOSIS — Z794 Long term (current) use of insulin: Secondary | ICD-10-CM

## 2016-11-30 DIAGNOSIS — D72829 Elevated white blood cell count, unspecified: Secondary | ICD-10-CM

## 2016-11-30 DIAGNOSIS — E860 Dehydration: Secondary | ICD-10-CM | POA: Diagnosis present

## 2016-11-30 DIAGNOSIS — E1165 Type 2 diabetes mellitus with hyperglycemia: Secondary | ICD-10-CM | POA: Diagnosis present

## 2016-11-30 DIAGNOSIS — E871 Hypo-osmolality and hyponatremia: Secondary | ICD-10-CM | POA: Diagnosis present

## 2016-11-30 DIAGNOSIS — I10 Essential (primary) hypertension: Secondary | ICD-10-CM | POA: Diagnosis present

## 2016-11-30 DIAGNOSIS — R0682 Tachypnea, not elsewhere classified: Secondary | ICD-10-CM

## 2016-11-30 DIAGNOSIS — C9110 Chronic lymphocytic leukemia of B-cell type not having achieved remission: Secondary | ICD-10-CM | POA: Diagnosis present

## 2016-11-30 DIAGNOSIS — I110 Hypertensive heart disease with heart failure: Secondary | ICD-10-CM | POA: Diagnosis present

## 2016-11-30 DIAGNOSIS — I5032 Chronic diastolic (congestive) heart failure: Secondary | ICD-10-CM | POA: Diagnosis present

## 2016-11-30 LAB — I-STAT CG4 LACTIC ACID, ED: Lactic Acid, Venous: 3.55 mmol/L (ref 0.5–1.9)

## 2016-11-30 NOTE — ED Notes (Signed)
Pt placed on 2L O2 VIA [place] and oxygen saturation increased to 100%

## 2016-11-30 NOTE — ED Triage Notes (Signed)
PT presents by EMS from home for evaluation of altered mental status per family. Pt also reports lower abd pain and fever that began today. Pt is currently alert and oriented to person, place, and situation but confused to time. EMS reported a temp of 100.2 tempanically.

## 2016-12-01 ENCOUNTER — Encounter (HOSPITAL_COMMUNITY): Payer: Self-pay | Admitting: Family Medicine

## 2016-12-01 ENCOUNTER — Emergency Department (HOSPITAL_COMMUNITY): Payer: Medicare Other

## 2016-12-01 DIAGNOSIS — D72829 Elevated white blood cell count, unspecified: Secondary | ICD-10-CM | POA: Diagnosis not present

## 2016-12-01 DIAGNOSIS — E1142 Type 2 diabetes mellitus with diabetic polyneuropathy: Secondary | ICD-10-CM | POA: Diagnosis present

## 2016-12-01 DIAGNOSIS — I480 Paroxysmal atrial fibrillation: Secondary | ICD-10-CM | POA: Diagnosis present

## 2016-12-01 DIAGNOSIS — F0390 Unspecified dementia without behavioral disturbance: Secondary | ICD-10-CM | POA: Diagnosis present

## 2016-12-01 DIAGNOSIS — Z1612 Extended spectrum beta lactamase (ESBL) resistance: Secondary | ICD-10-CM | POA: Diagnosis present

## 2016-12-01 DIAGNOSIS — R6521 Severe sepsis with septic shock: Secondary | ICD-10-CM | POA: Diagnosis present

## 2016-12-01 DIAGNOSIS — E785 Hyperlipidemia, unspecified: Secondary | ICD-10-CM | POA: Diagnosis present

## 2016-12-01 DIAGNOSIS — A4151 Sepsis due to Escherichia coli [E. coli]: Secondary | ICD-10-CM | POA: Diagnosis not present

## 2016-12-01 DIAGNOSIS — A419 Sepsis, unspecified organism: Secondary | ICD-10-CM | POA: Diagnosis not present

## 2016-12-01 DIAGNOSIS — C9190 Lymphoid leukemia, unspecified not having achieved remission: Secondary | ICD-10-CM | POA: Diagnosis not present

## 2016-12-01 DIAGNOSIS — E860 Dehydration: Secondary | ICD-10-CM | POA: Diagnosis present

## 2016-12-01 DIAGNOSIS — E1165 Type 2 diabetes mellitus with hyperglycemia: Secondary | ICD-10-CM | POA: Diagnosis present

## 2016-12-01 DIAGNOSIS — Z66 Do not resuscitate: Secondary | ICD-10-CM | POA: Diagnosis present

## 2016-12-01 DIAGNOSIS — E669 Obesity, unspecified: Secondary | ICD-10-CM | POA: Diagnosis present

## 2016-12-01 DIAGNOSIS — Z8679 Personal history of other diseases of the circulatory system: Secondary | ICD-10-CM | POA: Diagnosis not present

## 2016-12-01 DIAGNOSIS — D630 Anemia in neoplastic disease: Secondary | ICD-10-CM | POA: Diagnosis present

## 2016-12-01 DIAGNOSIS — R197 Diarrhea, unspecified: Secondary | ICD-10-CM | POA: Diagnosis present

## 2016-12-01 DIAGNOSIS — N179 Acute kidney failure, unspecified: Secondary | ICD-10-CM | POA: Diagnosis not present

## 2016-12-01 DIAGNOSIS — I5032 Chronic diastolic (congestive) heart failure: Secondary | ICD-10-CM | POA: Diagnosis present

## 2016-12-01 DIAGNOSIS — Z96659 Presence of unspecified artificial knee joint: Secondary | ICD-10-CM | POA: Diagnosis present

## 2016-12-01 DIAGNOSIS — N390 Urinary tract infection, site not specified: Secondary | ICD-10-CM | POA: Diagnosis not present

## 2016-12-01 DIAGNOSIS — E872 Acidosis: Secondary | ICD-10-CM | POA: Diagnosis present

## 2016-12-01 DIAGNOSIS — C9110 Chronic lymphocytic leukemia of B-cell type not having achieved remission: Secondary | ICD-10-CM | POA: Diagnosis present

## 2016-12-01 DIAGNOSIS — G9340 Encephalopathy, unspecified: Secondary | ICD-10-CM

## 2016-12-01 DIAGNOSIS — R0602 Shortness of breath: Secondary | ICD-10-CM | POA: Diagnosis not present

## 2016-12-01 DIAGNOSIS — E11649 Type 2 diabetes mellitus with hypoglycemia without coma: Secondary | ICD-10-CM | POA: Diagnosis present

## 2016-12-01 DIAGNOSIS — R7881 Bacteremia: Secondary | ICD-10-CM | POA: Diagnosis not present

## 2016-12-01 DIAGNOSIS — I110 Hypertensive heart disease with heart failure: Secondary | ICD-10-CM | POA: Diagnosis present

## 2016-12-01 DIAGNOSIS — E871 Hypo-osmolality and hyponatremia: Secondary | ICD-10-CM | POA: Diagnosis present

## 2016-12-01 DIAGNOSIS — I10 Essential (primary) hypertension: Secondary | ICD-10-CM | POA: Diagnosis not present

## 2016-12-01 LAB — URINALYSIS, ROUTINE W REFLEX MICROSCOPIC
Bilirubin Urine: NEGATIVE
GLUCOSE, UA: NEGATIVE mg/dL
Ketones, ur: NEGATIVE mg/dL
Nitrite: NEGATIVE
Protein, ur: NEGATIVE mg/dL
Specific Gravity, Urine: 1.02 (ref 1.005–1.030)
pH: 5 (ref 5.0–8.0)

## 2016-12-01 LAB — CBC WITH DIFFERENTIAL/PLATELET
BASOS ABS: 0 10*3/uL (ref 0.0–0.1)
Basophils Relative: 0 %
Eosinophils Absolute: 0 10*3/uL (ref 0.0–0.7)
Eosinophils Relative: 0 %
HEMATOCRIT: 34.6 % — AB (ref 36.0–46.0)
Hemoglobin: 11.4 g/dL — ABNORMAL LOW (ref 12.0–15.0)
LYMPHS ABS: 1.6 10*3/uL (ref 0.7–4.0)
Lymphocytes Relative: 6 %
MCH: 30.3 pg (ref 26.0–34.0)
MCHC: 32.9 g/dL (ref 30.0–36.0)
MCV: 92 fL (ref 78.0–100.0)
MONO ABS: 1.1 10*3/uL — AB (ref 0.1–1.0)
MONOS PCT: 4 %
NEUTROS ABS: 23.8 10*3/uL — AB (ref 1.7–7.7)
Neutrophils Relative %: 90 %
PLATELETS: 142 10*3/uL — AB (ref 150–400)
RBC: 3.76 MIL/uL — AB (ref 3.87–5.11)
RDW: 13.4 % (ref 11.5–15.5)
WBC Morphology: INCREASED
WBC: 26.5 10*3/uL — AB (ref 4.0–10.5)

## 2016-12-01 LAB — CBC
HEMATOCRIT: 32.9 % — AB (ref 36.0–46.0)
Hemoglobin: 10.8 g/dL — ABNORMAL LOW (ref 12.0–15.0)
MCH: 30.3 pg (ref 26.0–34.0)
MCHC: 32.8 g/dL (ref 30.0–36.0)
MCV: 92.4 fL (ref 78.0–100.0)
PLATELETS: 127 10*3/uL — AB (ref 150–400)
RBC: 3.56 MIL/uL — ABNORMAL LOW (ref 3.87–5.11)
RDW: 13.4 % (ref 11.5–15.5)
WBC: 23.1 10*3/uL — ABNORMAL HIGH (ref 4.0–10.5)

## 2016-12-01 LAB — GLUCOSE, CAPILLARY
GLUCOSE-CAPILLARY: 202 mg/dL — AB (ref 65–99)
GLUCOSE-CAPILLARY: 178 mg/dL — AB (ref 65–99)

## 2016-12-01 LAB — COMPREHENSIVE METABOLIC PANEL
ALBUMIN: 3.6 g/dL (ref 3.5–5.0)
ALK PHOS: 68 U/L (ref 38–126)
ALT: 20 U/L (ref 14–54)
AST: 25 U/L (ref 15–41)
Anion gap: 12 (ref 5–15)
BILIRUBIN TOTAL: 0.7 mg/dL (ref 0.3–1.2)
BUN: 40 mg/dL — AB (ref 6–20)
CO2: 23 mmol/L (ref 22–32)
Calcium: 9 mg/dL (ref 8.9–10.3)
Chloride: 99 mmol/L — ABNORMAL LOW (ref 101–111)
Creatinine, Ser: 2 mg/dL — ABNORMAL HIGH (ref 0.44–1.00)
GFR calc Af Amer: 26 mL/min — ABNORMAL LOW (ref >60)
GFR, EST NON AFRICAN AMERICAN: 22 mL/min — AB (ref >60)
GLUCOSE: 282 mg/dL — AB (ref 65–99)
POTASSIUM: 4.2 mmol/L (ref 3.5–5.1)
Sodium: 134 mmol/L — ABNORMAL LOW (ref 135–145)
TOTAL PROTEIN: 6.9 g/dL (ref 6.5–8.1)

## 2016-12-01 LAB — BLOOD CULTURE ID PANEL (REFLEXED)
ACINETOBACTER BAUMANNII: NOT DETECTED
CANDIDA ALBICANS: NOT DETECTED
CANDIDA GLABRATA: NOT DETECTED
CANDIDA KRUSEI: NOT DETECTED
CANDIDA TROPICALIS: NOT DETECTED
Candida parapsilosis: NOT DETECTED
Carbapenem resistance: NOT DETECTED
ENTEROBACTER CLOACAE COMPLEX: NOT DETECTED
ESCHERICHIA COLI: NOT DETECTED
Enterobacteriaceae species: DETECTED — AB
Enterococcus species: NOT DETECTED
Haemophilus influenzae: NOT DETECTED
KLEBSIELLA OXYTOCA: NOT DETECTED
KLEBSIELLA PNEUMONIAE: NOT DETECTED
Listeria monocytogenes: NOT DETECTED
NEISSERIA MENINGITIDIS: NOT DETECTED
PROTEUS SPECIES: DETECTED — AB
Pseudomonas aeruginosa: NOT DETECTED
STREPTOCOCCUS AGALACTIAE: NOT DETECTED
Serratia marcescens: NOT DETECTED
Staphylococcus aureus (BCID): NOT DETECTED
Staphylococcus species: NOT DETECTED
Streptococcus pneumoniae: NOT DETECTED
Streptococcus pyogenes: NOT DETECTED
Streptococcus species: NOT DETECTED

## 2016-12-01 LAB — LACTIC ACID, PLASMA: Lactic Acid, Venous: 2.3 mmol/L (ref 0.5–1.9)

## 2016-12-01 LAB — BASIC METABOLIC PANEL
ANION GAP: 10 (ref 5–15)
BUN: 39 mg/dL — ABNORMAL HIGH (ref 6–20)
CALCIUM: 8 mg/dL — AB (ref 8.9–10.3)
CO2: 22 mmol/L (ref 22–32)
Chloride: 105 mmol/L (ref 101–111)
Creatinine, Ser: 2.02 mg/dL — ABNORMAL HIGH (ref 0.44–1.00)
GFR, EST AFRICAN AMERICAN: 25 mL/min — AB (ref >60)
GFR, EST NON AFRICAN AMERICAN: 22 mL/min — AB (ref >60)
Glucose, Bld: 233 mg/dL — ABNORMAL HIGH (ref 65–99)
Potassium: 4.2 mmol/L (ref 3.5–5.1)
SODIUM: 137 mmol/L (ref 135–145)

## 2016-12-01 LAB — CREATININE, URINE, RANDOM: Creatinine, Urine: 142.21 mg/dL

## 2016-12-01 LAB — CBG MONITORING, ED: Glucose-Capillary: 230 mg/dL — ABNORMAL HIGH (ref 65–99)

## 2016-12-01 LAB — PROTIME-INR
INR: 1.07
Prothrombin Time: 13.9 seconds (ref 11.4–15.2)

## 2016-12-01 MED ORDER — PIPERACILLIN-TAZOBACTAM 3.375 G IVPB 30 MIN
3.3750 g | Freq: Once | INTRAVENOUS | Status: AC
  Administered 2016-12-01: 3.375 g via INTRAVENOUS
  Filled 2016-12-01: qty 50

## 2016-12-01 MED ORDER — FAMOTIDINE 20 MG PO TABS
20.0000 mg | ORAL_TABLET | Freq: Every day | ORAL | Status: DC
  Administered 2016-12-01 – 2016-12-08 (×8): 20 mg via ORAL
  Filled 2016-12-01 (×8): qty 1

## 2016-12-01 MED ORDER — VANCOMYCIN HCL IN DEXTROSE 1-5 GM/200ML-% IV SOLN
1000.0000 mg | Freq: Once | INTRAVENOUS | Status: AC
  Administered 2016-12-01: 1000 mg via INTRAVENOUS
  Filled 2016-12-01: qty 200

## 2016-12-01 MED ORDER — FERROUS SULFATE 325 (65 FE) MG PO TABS
325.0000 mg | ORAL_TABLET | Freq: Every day | ORAL | Status: DC
  Administered 2016-12-01 – 2016-12-09 (×9): 325 mg via ORAL
  Filled 2016-12-01 (×9): qty 1

## 2016-12-01 MED ORDER — SERTRALINE HCL 100 MG PO TABS
100.0000 mg | ORAL_TABLET | Freq: Every day | ORAL | Status: DC
  Administered 2016-12-02 – 2016-12-09 (×8): 100 mg via ORAL
  Filled 2016-12-01 (×8): qty 1

## 2016-12-01 MED ORDER — ACETAMINOPHEN 325 MG PO TABS
650.0000 mg | ORAL_TABLET | Freq: Four times a day (QID) | ORAL | Status: DC | PRN
  Administered 2016-12-01 – 2016-12-04 (×3): 650 mg via ORAL
  Filled 2016-12-01 (×3): qty 2

## 2016-12-01 MED ORDER — PIPERACILLIN-TAZOBACTAM 3.375 G IVPB
3.3750 g | Freq: Three times a day (TID) | INTRAVENOUS | Status: DC
  Administered 2016-12-01 – 2016-12-03 (×8): 3.375 g via INTRAVENOUS
  Filled 2016-12-01 (×9): qty 50

## 2016-12-01 MED ORDER — ACETAMINOPHEN 650 MG RE SUPP: 650.0000 mg | Freq: Four times a day (QID) | RECTAL | Status: DC | PRN

## 2016-12-01 MED ORDER — ENOXAPARIN SODIUM 30 MG/0.3ML ~~LOC~~ SOLN
30.0000 mg | SUBCUTANEOUS | Status: DC
  Administered 2016-12-02 – 2016-12-09 (×8): 30 mg via SUBCUTANEOUS
  Filled 2016-12-01 (×9): qty 0.3

## 2016-12-01 MED ORDER — INSULIN ASPART 100 UNIT/ML ~~LOC~~ SOLN
0.0000 [IU] | Freq: Three times a day (TID) | SUBCUTANEOUS | Status: DC
  Administered 2016-12-01 (×2): 5 [IU] via SUBCUTANEOUS
  Administered 2016-12-01: 3 [IU] via SUBCUTANEOUS
  Administered 2016-12-02 – 2016-12-03 (×2): 2 [IU] via SUBCUTANEOUS
  Administered 2016-12-04: 5 [IU] via SUBCUTANEOUS
  Administered 2016-12-05: 2 [IU] via SUBCUTANEOUS
  Administered 2016-12-05: 8 [IU] via SUBCUTANEOUS
  Administered 2016-12-06: 5 [IU] via SUBCUTANEOUS
  Administered 2016-12-06 – 2016-12-07 (×2): 3 [IU] via SUBCUTANEOUS
  Administered 2016-12-07 – 2016-12-08 (×4): 2 [IU] via SUBCUTANEOUS
  Administered 2016-12-08 – 2016-12-09 (×2): 5 [IU] via SUBCUTANEOUS
  Administered 2016-12-09 (×2): 2 [IU] via SUBCUTANEOUS
  Filled 2016-12-01: qty 1

## 2016-12-01 MED ORDER — SODIUM CHLORIDE 0.9 % IV BOLUS (SEPSIS)
1000.0000 mL | Freq: Once | INTRAVENOUS | Status: AC
  Administered 2016-12-01: 1000 mL via INTRAVENOUS

## 2016-12-01 MED ORDER — ONDANSETRON HCL 4 MG PO TABS: 4.0000 mg | ORAL_TABLET | Freq: Four times a day (QID) | ORAL | Status: DC | PRN

## 2016-12-01 MED ORDER — SODIUM CHLORIDE 0.9 % IV SOLN
INTRAVENOUS | Status: DC
  Administered 2016-12-01 – 2016-12-02 (×4): via INTRAVENOUS

## 2016-12-01 MED ORDER — SODIUM CHLORIDE 0.9% FLUSH
3.0000 mL | Freq: Two times a day (BID) | INTRAVENOUS | Status: DC
  Administered 2016-12-01 – 2016-12-03 (×5): 3 mL via INTRAVENOUS

## 2016-12-01 MED ORDER — INSULIN GLARGINE 100 UNIT/ML ~~LOC~~ SOLN
35.0000 [IU] | Freq: Every day | SUBCUTANEOUS | Status: DC
  Administered 2016-12-01 (×2): 35 [IU] via SUBCUTANEOUS
  Filled 2016-12-01 (×3): qty 0.35

## 2016-12-01 MED ORDER — ONDANSETRON HCL 4 MG/2ML IJ SOLN: 4.0000 mg | Freq: Four times a day (QID) | INTRAMUSCULAR | Status: DC | PRN

## 2016-12-01 MED ORDER — ASPIRIN EC 81 MG PO TBEC
81.0000 mg | DELAYED_RELEASE_TABLET | Freq: Every day | ORAL | Status: DC
  Administered 2016-12-02 – 2016-12-09 (×8): 81 mg via ORAL
  Filled 2016-12-01 (×10): qty 1

## 2016-12-01 MED ORDER — INSULIN ASPART 100 UNIT/ML ~~LOC~~ SOLN
0.0000 [IU] | Freq: Every day | SUBCUTANEOUS | Status: DC
  Administered 2016-12-06 – 2016-12-08 (×3): 2 [IU] via SUBCUTANEOUS

## 2016-12-01 MED ORDER — SODIUM CHLORIDE 0.9 % IV BOLUS (SEPSIS)
500.0000 mL | Freq: Once | INTRAVENOUS | Status: AC
  Administered 2016-12-01: 500 mL via INTRAVENOUS

## 2016-12-01 MED ORDER — CARVEDILOL 6.25 MG PO TABS
6.2500 mg | ORAL_TABLET | Freq: Two times a day (BID) | ORAL | Status: DC
  Administered 2016-12-01 – 2016-12-03 (×5): 6.25 mg via ORAL
  Filled 2016-12-01 (×5): qty 1

## 2016-12-01 MED ORDER — ACETAMINOPHEN 325 MG PO TABS
650.0000 mg | ORAL_TABLET | Freq: Once | ORAL | Status: AC
  Administered 2016-12-01: 650 mg via ORAL
  Filled 2016-12-01: qty 2

## 2016-12-01 NOTE — ED Provider Notes (Signed)
[facility] DEPT Provider Note   CSN: [id number] Arrival date & time: 11/30/16  2308     History   Chief Complaint Chief Complaint  Patient presents with  . Fever  . Altered Mental Status  . Abdominal Pain    HPI Carrie Atkins is a 83 y.o. female.  HPI  83 y.o. female with a hx of CLL, DM, HTN, HLD, presents to the Emergency Department today due to generalized weakness and AMS noted this afternoon. Pt baseline yesterday with ambulation and good mentation. Pt notes abdominal pain that is suprapubic. Noted dysuria as well as odor. Fever noted this afternoon as well in 100sF. Notes cough x 1 week that was non productive. No CP/SOB. Rates pain 5/10 in abdomen. No N/V/D. Normal BMs. No other symptoms noted.   Past Medical History:  Diagnosis Date  . Chest pain   . Chronic lymphocytic leukemia ([place])   . CLL (chronic lymphocytic leukemia) ([place]) 06/05/2013  . Diabetes mellitus   . Hyperlipidemia   . Hypertension   . Leukemia ([place])   . SOB (shortness of breath)     Patient Active Problem List   Diagnosis Date Noted  . Severe sepsis with septic shock ([place]) 09/22/2016  . ESBL (extended spectrum beta-lactamase) producing bacteria infection 09/22/2016  . Hypotension   . Paroxysmal atrial fibrillation ([place]) 09/17/2016  . E. coli UTI   . Leukocytosis   . AKI (acute kidney injury) ([place])   . Acute cystitis without hematuria   . Sepsis ([place])   . Hyperglycemia   . Acute encephalopathy 12/31/2015  . DKA (diabetic ketoacidoses) ([place]) 12/31/2015  . Seizure ([place])   . Spinal stenosis, lumbar region, with neurogenic claudication 01/30/2015  . Cervical myelopathy ([place]) 01/30/2015  . Gait difficulty 10/24/2014  . Hyperreflexia 10/24/2014  . Weakness of both lower extremities 10/24/2014  . Diabetic polyneuropathy associated with type 2 diabetes mellitus ([place]) 10/24/2014  . Dyspnea 07/14/2013  . CLL (chronic lymphocytic leukemia) ([place]) 06/05/2013  . HTN (hypertension) 02/03/2011  .  Hyperlipidemia 02/03/2011    Past Surgical History:  Procedure Laterality Date  . CARDIOVASCULAR STRESS TEST  05/29/2010   EF 83%  . CHOLECYSTECTOMY    . TONSILLECTOMY    . TOTAL KNEE ARTHROPLASTY    . US ECHOCARDIOGRAPHY  01/10/2007   EF 55-60%  . US ECHOCARDIOGRAPHY  02/23/2006    EF 55-60%    OB History    No data available       Home Medications    Prior to Admission medications   Medication Sig Start Date End Date Taking? Authorizing Provider  acetaminophen (TYLENOL ARTHRITIS PAIN) 650 MG CR tablet Take 1,300 mg by mouth 2 (two) times daily.     Yes [provider]  aspirin EC 81 MG tablet Take 81 mg by mouth daily.   Yes [provider]  CALCIUM-MAGNESIUM-ZINC PO Take 2 tablets by mouth 2 (two) times daily.    Yes [provider]  carvedilol (COREG) 25 MG tablet Take 1 tablet by mouth two times daily.*Patient is overdue and needs to call and schedule an appt for further refills 1st attempt* 11/26/16  Yes Nahser, Wonda Cheng, MD  ferrous sulfate 325 (65 FE) MG tablet Take 325 mg by mouth daily with breakfast.     Yes [provider]  furosemide (LASIX) 40 MG tablet Take 1 tablet (40 mg total) by mouth daily. 01/04/16  Yes Robbie Lis, MD  glimepiride (AMARYL) 4 MG tablet Take 8 mg by  mouth daily with breakfast.   Yes [provider]  Insulin Glargine (LANTUS) 100 UNIT/ML Solostar Pen Inject 8 Units into the skin daily at 10 pm. Patient taking differently: Inject 35 Units into the skin daily at 10 pm.  09/23/16  Yes Donne Hazel, MD  ranitidine (ZANTAC) 150 MG tablet Take 150 mg by mouth 2 (two) times daily as needed for heartburn.   Yes [provider]  sertraline (ZOLOFT) 100 MG tablet Take 100 mg by mouth daily after breakfast. 10/26/16  Yes [provider]  pioglitazone (ACTOS) 30 MG tablet Take 15 mg by mouth daily.    [provider]    Family History Family History  Problem Relation Age of Onset    . Stroke Mother   . Lung cancer Father   . COPD Brother     Social History Social History  Substance Use Topics  . Smoking status: Never Smoker  . Smokeless tobacco: Never Used  . Alcohol use No     Allergies   Sulfonamide derivatives; Hydrocodone; and Sulfa antibiotics   Review of Systems Review of Systems ROS reviewed and all are negative for acute change except as noted in the HPI.  Physical Exam Updated Vital Signs BP (!) 110/48 (BP Location: Left Arm)   Pulse 73   Temp (!) 101.7 F (38.7 C) (Rectal)   Resp (!) 30   Ht 5\' 1"  (1.549 m)   Wt 81.6 kg (180 lb)   SpO2 (!) 89%   BMI 34.01 kg/m   Physical Exam  Constitutional: Vital signs are normal. She appears well-developed and well-nourished. No distress.  HENT:  Head: Normocephalic and atraumatic.  Right Ear: Hearing, tympanic membrane, external ear and ear canal normal.  Left Ear: Hearing, tympanic membrane, external ear and ear canal normal.  Nose: Nose normal.  Mouth/Throat: Uvula is midline, oropharynx is clear and moist and mucous membranes are normal. No trismus in the jaw. No oropharyngeal exudate, posterior oropharyngeal erythema or tonsillar abscesses.  Eyes: Conjunctivae and EOM are normal. Pupils are equal, round, and reactive to light.  Neck: Normal range of motion. Neck supple. No tracheal deviation present.  Cardiovascular: Normal rate, regular rhythm, S1 normal, S2 normal, normal heart sounds, intact distal pulses and normal pulses.   Pulmonary/Chest: Effort normal. No respiratory distress. She has decreased breath sounds in the right upper field and the left upper field. She has no wheezes. She has no rhonchi. She has no rales.  Abdominal: Soft. Normal appearance and bowel sounds are normal. There is tenderness in the suprapubic area. There is no rigidity, no rebound, no guarding, no CVA tenderness, no tenderness at McBurney's point and negative Murphy's sign.  Musculoskeletal: Normal range of  motion.  Neurological: She is alert. She is disoriented (to time).  Skin: Skin is warm and dry.  Psychiatric: She has a normal mood and affect. Her speech is normal and behavior is normal. Thought content normal.  Nursing note and vitals reviewed.   ED Treatments / Results  Labs (all labs ordered are listed, but only abnormal results are displayed) Labs Reviewed  COMPREHENSIVE METABOLIC PANEL - Abnormal; Notable for the following:       Result Value   Sodium 134 (*)    Chloride 99 (*)    Glucose, Bld 282 (*)    BUN 40 (*)    Creatinine, Ser 2.00 (*)    GFR calc non Af Amer 22 (*)    GFR calc Af Amer 26 (*)  All other components within normal limits  CBC WITH DIFFERENTIAL/PLATELET - Abnormal; Notable for the following:    WBC 26.5 (*)    RBC 3.76 (*)    Hemoglobin 11.4 (*)    HCT 34.6 (*)    Platelets 142 (*)    Neutro Abs 23.8 (*)    Monocytes Absolute 1.1 (*)    All other components within normal limits  URINALYSIS, ROUTINE W REFLEX MICROSCOPIC - Abnormal; Notable for the following:    APPearance HAZY (*)    Hgb urine dipstick MODERATE (*)    Leukocytes, UA LARGE (*)    Bacteria, UA MANY (*)    Squamous Epithelial / LPF 0-5 (*)    All other components within normal limits  I-STAT CG4 LACTIC ACID, ED - Abnormal; Notable for the following:    Lactic Acid, Venous 3.55 (*)    All other components within normal limits  CULTURE, BLOOD (ROUTINE X 2)  CULTURE, BLOOD (ROUTINE X 2)  URINE CULTURE  PROTIME-INR    EKG  EKG Interpretation None       Radiology Dg Chest 2 View  Result Date: 2020-12-316 CLINICAL DATA:  83 year old female with shortness of breath. EXAM: CHEST  2 VIEW COMPARISON:  Chest radiograph dated 09/17/2016 FINDINGS: There is shallow inspiration with minimal bibasilar atelectatic changes. No focal consolidation, pleural effusion, or pneumothorax. Top-normal cardiac size. No acute osseous pathology. IMPRESSION: Shallow inspiration.  No focal  consolidation. Electronically Signed   By: Anner Crete M.D.   On: 02020-12-316 00:24    Procedures Procedures (including critical care time)  Medications Ordered in ED Medications  sodium chloride 0.9 % bolus 1,000 mL (1,000 mLs Intravenous New Bag/Given 12/01/16 0058)    And  sodium chloride 0.9 % bolus 1,000 mL (0 mLs Intravenous Stopped 12/01/16 0136)    And  sodium chloride 0.9 % bolus 500 mL (not administered)  vancomycin (VANCOCIN) IVPB 1000 mg/200 mL premix (not administered)  piperacillin-tazobactam (ZOSYN) IVPB 3.375 g (0 g Intravenous Stopped 12/01/16 0126)  acetaminophen (TYLENOL) tablet 650 mg (650 mg Oral Given 12/01/16 0053)     Initial Impression / Assessment and Plan / ED Course  I have reviewed the triage vital signs and the nursing notes.  Pertinent labs & imaging results that were available during my care of the patient were reviewed by me and considered in my medical decision making (see chart for details).  Final Clinical Impressions(s) / ED Diagnoses  {I have reviewed and evaluated the relevant laboratory values. {I have reviewed and evaluated the relevant imaging studies. {I have interpreted the relevant EKG. {I have reviewed the relevant previous healthcare records. {I have reviewed EMS Documentation. {I obtained HPI from historian. {Patient discussed with supervising physician.  ED Course:  Assessment: Pt is a 83 y.o. female with hx CLL, DM, HTN, HLD who presents with AMS and fever this afternoon. Generalized weakness noted. Cough x 1 week. Also noted dysuria and odor today. On exam, pt in Nontoxic/nonseptic appearing. VSS. Temp 101.65F Rectal. Lungs CTA. Heart RRR. Abdomen nontender soft. iStat Lactate 3.55. WBC 26. Code Sepsis initiated. Made NPO. Given Vanc/Zosyn abx to cover Unknown source as could be pulmonary vs urinary. Noted O2 saturations 88% on RA on arrival. 2L [place] with O2 sat 100%. NS given as per Sepsis protocol. CXR unremarkable.Marland Kitchen UA shows UTI. Urine  culture sent. Plan is to Admit to medicine. Repeat sepsis assessment completed.  Disposition/Plan:  Admit Pt acknowledges and agrees with plan  Supervising Physician Ripley Fraise,  MD  Final diagnoses:  Urinary tract infection without hematuria, site unspecified  Leukocytosis, unspecified type  Lactic acid acidosis    New Prescriptions New Prescriptions   No medications on file     Shary Decamp, Hershal Coria 12/01/16 0141    Ripley Fraise, MD 12/02/16 [phone]

## 2016-12-01 NOTE — Progress Notes (Signed)
PROGRESS NOTE    Carrie Atkins  PIR:[id number] DOB: 1933/11/13 DOA: 11/30/2016 PCP: [name], MD   Chief Complaint  Patient presents with  . Fever  . Altered Mental Status  . Abdominal Pain    Brief Narrative:  HPI on 11/04/2016 by Dr. Myrene Buddy Carrie Atkins is a 83 y.o. female with a past medical history significant for CLL, IDDM, HTN, and ESBL UTI who presents with confusion for 1 day, dysuria and malaise. The patient was in her usual state of health until about the last few days when she started to notice some dysuria and suprapubic or periumbilical vague mild abdominal discomfort. Then today, throughout the day, her daughter noticed that she was intermittently confused, and finally this evening she was febrile and too weak to get out of a chair, and so her daughter called EMS.  Assessment & Plan   Severe sepsis secondary to urinary tract infection -Upon admission, patient was tachypneic, febrile with leukocytosis and had elevated lactic acid level. Patient also had acute kidney injury -Lactic acid improving, currently 2.3 (3.55 upon admission) -CXR unremarkable for infection -UA: Many bacteria, TNTC WBC, large leukocytes -Pending urine and blood cultures -Continue IV fluids, IV antibiotics with Zosyn  Acute kidney injury -Baseline creatinine 0.8, upon admission creatinine was 2 -Suspect secondary to sepsis and prerenal causes -Continue IV fluids and monitor BMP  Hyponatremia -Resolved, with IV fluids  -continue to monitor BMP  Normocytic anemia -Hemoglobin currently 10.8, baseline approximately 8-9 -Continue iron supplementation and monitor CBC  Diabetes mellitus, type II -Hold humalog, amaryl -Continue Lantus, insulin sliding scale, CBG monitoring  Essential hypertension -Continue Coreg  Chronic diastolic heart failure -Lasix currently held -Echocardiogram 09/18/2016 showed an EF of 63-01%, grade 1 diastolic dysfunction -Monitor intake and  output, daily weights  Atrial fibrillation, paroxysmal -Continue Coreg, aspirin -Currently patient in sinus rhythm -CHADSVASC 5 -Not on anticoagulation  Depression -Continue sertraline  GERD -Continue H2 blocker  DVT Prophylaxis  lovenox  Code Status: DNR  Family Communication: None at bedside  Disposition Plan: Admitted. Continue to treat sepsis.   Consultants None  Procedures  None  Antibiotics   Anti-infectives    Start     Dose/Rate Route Frequency Ordered Stop   12/01/16 0600  piperacillin-tazobactam (ZOSYN) IVPB 3.375 g     3.375 g 12.5 mL/hr over 240 Minutes Intravenous Every 8 hours 12/01/16 0501     12/01/16 0015  piperacillin-tazobactam (ZOSYN) IVPB 3.375 g     3.375 g 100 mL/hr over 30 Minutes Intravenous  Once 12/01/16 0012 12/01/16 0126   12/01/16 0015  vancomycin (VANCOCIN) IVPB 1000 mg/200 mL premix     1,000 mg 200 mL/hr over 60 Minutes Intravenous  Once 12/01/16 0012 12/01/16 0241      Subjective:   Carrie Atkins seen and examined today.  Not very interactive, somewhat sleepy. Has no complaints at this time.   Objective:   Vitals:   12/01/16 0730 12/01/16 0822 12/01/16 0830 12/01/16 1049  BP: (!) 122/48  (!) 150/74 (!) 120/45  Pulse: 95  99 76  Resp: (!) 46  (!) 35 (!) 28  Temp:  (!) 103.5 F (39.7 C)  (!) 100.5 F (38.1 C)  TempSrc:  Axillary  Oral  SpO2: 98%  96% 95%  Weight:    87.2 kg (192 lb 3.9 oz)  Height:    5\' 1"  (1.549 m)   No intake or output data in the 24 hours ending 12/01/16 [place]  11/30/16 2330 12/01/16 1049  Weight: 81.6 kg (180 lb) 87.2 kg (192 lb 3.9 oz)    Exam  General: Well developed,Elderly, no apparent distress  HEENT: NCAT, mucous membranes dry  Cardiovascular: S1 S2 auscultated, no rubs, murmurs or gallops. Regular rate and rhythm.  Respiratory: Clear to auscultation bilaterally with equal chest rise  Abdomen: Soft, nontender, nondistended, + bowel sounds  Extremities: warm dry  without cyanosis clubbing or edema  Neuro: AAOx1, currently nonfocal, not very interactive.  Psych: unable to truly assess given patient's current state   Data Reviewed: I have personally reviewed following labs and imaging studies  CBC:  Recent Labs Lab 11/30/16 2342 12/01/16 0515  WBC 26.5* 23.1*  NEUTROABS 23.8*  --   HGB 11.4* 10.8*  HCT 34.6* 32.9*  MCV 92.0 92.4  PLT 142* 756*   Basic Metabolic Panel:  Recent Labs Lab 11/30/16 2342 12/01/16 0515  NA 134* 137  K 4.2 4.2  CL 99* 105  CO2 23 22  GLUCOSE 282* 233*  BUN 40* 39*  CREATININE 2.00* 2.02*  CALCIUM 9.0 8.0*   GFR: Estimated Creatinine Clearance: 21.9 mL/min (A) (by C-G formula based on SCr of 2.02 mg/dL (H)). Liver Function Tests:  Recent Labs Lab 11/30/16 2342  AST 25  ALT 20  ALKPHOS 68  BILITOT 0.7  PROT 6.9  ALBUMIN 3.6   No results for input(s): LIPASE, AMYLASE in the last 168 hours. No results for input(s): AMMONIA in the last 168 hours. Coagulation Profile:  Recent Labs Lab 11/30/16 2342  INR 1.07   Cardiac Enzymes: No results for input(s): CKTOTAL, CKMB, CKMBINDEX, TROPONINI in the last 168 hours. BNP (last 3 results) No results for input(s): PROBNP in the last 8760 hours. HbA1C: No results for input(s): HGBA1C in the last 72 hours. CBG:  Recent Labs Lab 12/01/16 0744  GLUCAP 230*   Lipid Profile: No results for input(s): CHOL, HDL, LDLCALC, TRIG, CHOLHDL, LDLDIRECT in the last 72 hours. Thyroid Function Tests: No results for input(s): TSH, T4TOTAL, FREET4, T3FREE, THYROIDAB in the last 72 hours. Anemia Panel: No results for input(s): VITAMINB12, FOLATE, FERRITIN, TIBC, IRON, RETICCTPCT in the last 72 hours. Urine analysis:    Component Value Date/Time   COLORURINE YELLOW 11/30/2016 2333   APPEARANCEUR HAZY (A) 11/30/2016 2333   LABSPEC 1.020 11/30/2016 2333   PHURINE 5.0 11/30/2016 2333   GLUCOSEU NEGATIVE 11/30/2016 2333   HGBUR MODERATE (A) 11/30/2016 2333     BILIRUBINUR NEGATIVE 11/30/2016 2333   KETONESUR NEGATIVE 11/30/2016 2333   PROTEINUR NEGATIVE 11/30/2016 2333   NITRITE NEGATIVE 11/30/2016 2333   LEUKOCYTESUR LARGE (A) 11/30/2016 2333   Sepsis Labs: @LABRCNTIP (procalcitonin:4,lacticidven:4)  )No results found for this or any previous visit (from the past 240 hour(s)).    Radiology Studies: Dg Chest 2 View  Result Date: July 26, 202018 CLINICAL DATA:  83 year old female with shortness of breath. EXAM: CHEST  2 VIEW COMPARISON:  Chest radiograph dated 09/17/2016 FINDINGS: There is shallow inspiration with minimal bibasilar atelectatic changes. No focal consolidation, pleural effusion, or pneumothorax. Top-normal cardiac size. No acute osseous pathology. IMPRESSION: Shallow inspiration.  No focal consolidation. Electronically Signed   By: Anner Crete M.D.   On: 0July 26, 202018 00:24     Scheduled Meds: . aspirin EC  81 mg Oral Daily  . carvedilol  6.25 mg Oral BID WC  . enoxaparin (LOVENOX) injection  30 mg Subcutaneous Q24H  . famotidine  20 mg Oral QHS  . ferrous sulfate  325 mg Oral Q breakfast  .  insulin aspart  0-15 Units Subcutaneous TID WC  . insulin aspart  0-5 Units Subcutaneous QHS  . insulin glargine  35 Units Subcutaneous Q2200  . sertraline  100 mg Oral QPC breakfast  . sodium chloride flush  3 mL Intravenous Q12H   Continuous Infusions: . sodium chloride 100 mL/hr at 12/01/16 0530  . piperacillin-tazobactam (ZOSYN)  IV Stopped (12/01/16 1007)     LOS: 0 days   Time Spent in minutes   30 minutes  Dickey Caamano D.O. on 07-10-2016 at 1:07 PM  Between 7am to 7pm - Pager - [phone]  After 7pm go to [email or web address] - password [email or web address]  And look for the night coverage person covering for me after hours  [facility] Office  [phone]

## 2016-12-01 NOTE — H&P (Signed)
History and Physical  Patient Name: Carrie Atkins     YDX:[id number]    DOB: [birth year]    DOA: 11/30/2016 PCP: [name], MD  Patient coming from: Home  Chief Complaint: Confusion, weakness, dysuria      HPI: Carrie Atkins is a 83 y.o. female with a past medical history significant for CLL, IDDM, HTN, and ESBL UTI who presents with confusion for 1 day, dysuria and malaise.  The patient was in her usual state of health until about the last few days when she started to notice some dysuria and suprapubic or periumbilical vague mild abdominal discomfort. Then today, throughout the day, her daughter noticed that she was intermittently confused, and finally this evening she was febrile and too weak to get out of a chair, and so her daughter called EMS.  ED course: -Temperature 101.33F, heart rate 73, respirations 30, blood pressure 110/48, pulse oximetry 89% on room air -Na 134, K 4.2, Cr 2.0 (baseline 0.8), WBC 26.5 K, Hgb 11.4 -Glucose 282 -Transaminases normal -INR normal -Urinalysis showed full field WBCs, bacteria, RBCs -Lactate 3.55 -Chest x-ray poor quality, no focal opacity -Cultures were obtained, she was given vancomycin and Zosyn and IV fluids and TRH was asked to evaluate for sepsis from UTI  Of note, the patient was admitted for septic shock twice in the last year, both times for UTI.  The first time in July of 2017 was with mostly susceptible E coli, more recently two months ago she was admitted with UTI sepsis, this time with ESBL organisms, Zosyn susceptible.        ROS: Review of Systems  Unable to perform ROS: Medical condition  Constitutional: Positive for fever and malaise/fatigue.  Respiratory: Positive for cough.   Genitourinary: Positive for dysuria.  Neurological: Positive for weakness.    ROS unreliable due to patient medical condition, although she is mostly able to articulate answers to questions, daughter corroborates history.      Past  Medical History:  Diagnosis Date  . Chest pain   . Chronic lymphocytic leukemia ([place])   . CLL (chronic lymphocytic leukemia) ([place]) 06/05/2013  . Diabetes mellitus   . Hyperlipidemia   . Hypertension   . Leukemia ([place])   . SOB (shortness of breath)     Past Surgical History:  Procedure Laterality Date  . CARDIOVASCULAR STRESS TEST  05/29/2010   EF 83%  . CHOLECYSTECTOMY    . TONSILLECTOMY    . TOTAL KNEE ARTHROPLASTY    . US ECHOCARDIOGRAPHY  01/10/2007   EF 55-60%  . US ECHOCARDIOGRAPHY  02/23/2006    EF 55-60%    Social History: Patient lives in an apartment on the back of her daughters house.  The patient walks with a walker.  Nonsmoker.  Mild dementia per daughter.  Allergies  Allergen Reactions  . Sulfonamide Derivatives Nausea And Vomiting  . Hydrocodone Nausea And Vomiting  . Sulfa Antibiotics Nausea And Vomiting    Family history: family history includes COPD in her brother; Lung cancer in her father; Stroke in her mother.  Prior to Admission medications   Medication Sig Start Date End Date Taking? Authorizing Provider  acetaminophen (TYLENOL ARTHRITIS PAIN) 650 MG CR tablet Take 1,300 mg by mouth 2 (two) times daily.     Yes [provider]  aspirin EC 81 MG tablet Take 81 mg by mouth daily.   Yes [provider]  CALCIUM-MAGNESIUM-ZINC PO Take 2 tablets by mouth 2 (two) times daily.  Yes [provider]  carvedilol (COREG) 25 MG tablet Take 1 tablet by mouth two times daily.*Patient is overdue and needs to call and schedule an appt for further refills 1st attempt* 11/26/16  Yes Nahser, Wonda Cheng, MD  ferrous sulfate 325 (65 FE) MG tablet Take 325 mg by mouth daily with breakfast.     Yes [provider]  furosemide (LASIX) 40 MG tablet Take 1 tablet (40 mg total) by mouth daily. 01/04/16  Yes Robbie Lis, MD  glimepiride (AMARYL) 4 MG tablet Take 8 mg by mouth daily with breakfast.   Yes [provider]  HUMALOG  KWIKPEN 100 UNIT/ML KiwkPen Inject 0-11 Units into the skin 4 (four) times daily -  before meals and at bedtime. <100=0 units 100-150= 3 units 151-200=5 units 201-250= 7 units 251-300=9 units 301-350= 11 units 11/19/16  Yes [provider]  Insulin Glargine (LANTUS) 100 UNIT/ML Solostar Pen Inject 8 Units into the skin daily at 10 pm. Patient taking differently: Inject 35 Units into the skin daily at 10 pm.  09/23/16  Yes Donne Hazel, MD  ranitidine (ZANTAC) 150 MG tablet Take 150 mg by mouth 2 (two) times daily as needed for heartburn.   Yes [provider]  sertraline (ZOLOFT) 100 MG tablet Take 100 mg by mouth daily after breakfast. 10/26/16  Yes [provider]       Physical Exam: BP (!) 105/53 (BP Location: Left Arm)   Pulse 76   Temp (!) 101 F (38.3 C) (Rectal)   Resp (!) 28   Ht 5\' 1"  (1.549 m)   Wt 81.6 kg (180 lb)   SpO2 97%   BMI 34.01 kg/m  General appearance: Well-developed, obese elderly adult female, awake and in no acute distress.  Appears tired.   Eyes: Anicteric, conjunctiva pink, lids and lashes normal. PERRL.    ENT: No nasal deformity, discharge, epistaxis.  Hearing slightly dimiinshed. OP with dry MM without lesions.  Poor dentition. Neck: No neck masses.  Trachea midline.  No thyromegaly/tenderness. Lymph: No cervical or supraclavicular lymphadenopathy. Skin: Warm and dry.  No jaundice.  No suspicious rashes or lesions. Cardiac: Tachycardia, nl S1-S2, no murmurs appreciated.  Capillary refill is brisk.  JVP not visible.  No LE edema.  Radial pulses 2+ and symmetric. Respiratory: Normal respiratory rate and rhythm.  CTAB without rales or wheezes.  Coarse throughout. Abdomen: Abdomen soft.  Mild nonfocal TTP without guarding or rebound. No ascites, distension, hepatosplenomegaly.   MSK: No deformities or effusions.  No cyanosis or clubbing. Neuro: Cranial nerves normal.  Sensation intact to light touch. Speech is fluent.  Muscle strength  globally weak.    Psych: Sensorium intact and responding to questions, somewhat confused, attention diminished.  Behavior appropriate.  Affect blunted.  Judgment and insight appear poor.     Labs on Admission:  I have personally reviewed following labs and imaging studies: CBC:  Recent Labs Lab 11/30/16 2342  WBC 26.5*  NEUTROABS 23.8*  HGB 11.4*  HCT 34.6*  MCV 92.0  PLT 400*   Basic Metabolic Panel:  Recent Labs Lab 11/30/16 2342  NA 134*  K 4.2  CL 99*  CO2 23  GLUCOSE 282*  BUN 40*  CREATININE 2.00*  CALCIUM 9.0   GFR: Estimated Creatinine Clearance: 21.3 mL/min (A) (by C-G formula based on SCr of 2 mg/dL (H)).  Liver Function Tests:  Recent Labs Lab 11/30/16 2342  AST 25  ALT 20  ALKPHOS 68  BILITOT  0.7  PROT 6.9  ALBUMIN 3.6   Coagulation Profile:  Recent Labs Lab 11/30/16 2342  INR 1.07   Sepsis Labs: Lactate 3.55        Radiological Exams on Admission: Personally reviewed CXR is poor quality, no lobar opacity: Dg Chest 2 View  Result Date: 05-22-2017 CLINICAL DATA:  83 year old female with shortness of breath. EXAM: CHEST  2 VIEW COMPARISON:  Chest radiograph dated 09/17/2016 FINDINGS: There is shallow inspiration with minimal bibasilar atelectatic changes. No focal consolidation, pleural effusion, or pneumothorax. Top-normal cardiac size. No acute osseous pathology. IMPRESSION: Shallow inspiration.  No focal consolidation. Electronically Signed   By: Anner Crete M.D.   On: 012-01-2017 00:24    EKG: Independently reviewed. Rate 73, QTc 453, no ST depressions.  Echocardiogram Apr 2018: Report reviewed  EF 60-65% Mild LVH Grade I DD         Assessment/Plan Principal Problem:   Sepsis ([place]) Active Problems:   HTN (hypertension)   CLL (chronic lymphocytic leukemia) ([facility])   Diabetic polyneuropathy associated with type 2 diabetes mellitus ([facility])   Acute encephalopathy   Paroxysmal atrial fibrillation ([place])  1. Sepsis:    Suspected source urinary tract. Organism unknown, but hx of ESBL.  Patient meets criteria given tachypnea, fever, leukocytosis, and evidence of organ dysfunction.  Lactate 3.55 mmol/L and repeat ordered within 6 hours.  This patient is at high risk of poor outcomes with a qSOFA score of 3 (at least 2 of the following clinical criteria: respiratory rate of 22/min or greater, altered mentation, or systolic blood pressure of 100 mm Hg or less).  Antibiotics delivered in the ED.    -Sepsis bundle utilized:  -Blood and urine cultures drawn  -30 ml/kg bolus given in ED, will repeat lactic acid  -Antibiotics: Zosyn  -Repeat renal function and complete blood count in AM  -Code SEPSIS called to E-link    2. Acute kidney injury:  Baseline creatinine 0.8, current creatinine 2.0.  Presumably from sepsis and pre-renal injury. -Check FeURea -Fluids and trend.  3. Hyponatremia:  Mild -Trend Na  4. Normocytic anemia:  Stable -Continue iron  5. Insulin-dependent diabetes:  Poorly controlled -Hold orals -Continue glargine -SSI with meals  6. Hypertension and HFpEF: No signs of active congestive heart failure, although chronic diastolic CHF is listed in chart.  -Hold furosemide until hemodynamics clearer -Continue BB  7. Atrial fibrillation:  CHADS2-VASc at least 5. Not on anticoagulation. -Continue beta blocker, lower dose temporarily while septic until hemodynamics clearer  8. Other medications: -Continue sertraline -Continue ranitidine    DVT prophylaxis: Lovenox  Code Status: DO NOT RESUSCITATE  Family Communication: Daughter at bedside  Disposition Plan: Anticipate IV fluids and empiric antibiotics and follow culture data Consults called: None Admission status: INPATIENT        Medical decision making: Patient seen at 2:25 AM on 05-22-2017.  The patient was discussed with Shary Decamp, PA-C.  What exists of the patient's chart was reviewed in depth and summarized above.   Clinical condition: HR good, BP stable with IV fluids, mentation stable to improving.        Edwin Dada [facility] Pager [phone]     At the time of admission, it appears that the appropriate admission status for this patient is INPATIENT. This is judged to be reasonable and necessary in order to provide the required intensity of service to ensure the patient's safety given the presenting symptoms, physical exam findings, and initial radiographic and laboratory data  in the context of their chronic comorbidities.  Together, these circumstances are felt to place her at high risk for further clinical deterioration threatening life, limb, or organ.   Patient requires inpatient status due to high intensity of service, high risk for further deterioration and high frequency of surveillance required because of this acute illness that poses a threat to life, limb or bodily function.  I certify that at the point of admission it is my clinical judgment that the patient will require inpatient hospital care spanning beyond 2 midnights from the point of admission and that early discharge would result in unnecessary risk of decompensation and readmission or threat to life, limb or bodily function.

## 2016-12-01 NOTE — ED Notes (Signed)
Dr. Danford at bedside  

## 2016-12-01 NOTE — Progress Notes (Signed)
PHARMACY - PHYSICIAN COMMUNICATION CRITICAL VALUE ALERT - BLOOD CULTURE IDENTIFICATION (BCID)  Results for orders placed or performed during the hospital encounter of 09/17/16  Blood Culture ID Panel (Reflexed) (Collected: 09/18/2016  6:42 PM)  Result Value Ref Range   Enterococcus species NOT DETECTED NOT DETECTED   Listeria monocytogenes NOT DETECTED NOT DETECTED   Staphylococcus species DETECTED (A) NOT DETECTED   Staphylococcus aureus NOT DETECTED NOT DETECTED   Methicillin resistance DETECTED (A) NOT DETECTED   Streptococcus species NOT DETECTED NOT DETECTED   Streptococcus agalactiae NOT DETECTED NOT DETECTED   Streptococcus pneumoniae NOT DETECTED NOT DETECTED   Streptococcus pyogenes NOT DETECTED NOT DETECTED   Acinetobacter baumannii NOT DETECTED NOT DETECTED   Enterobacteriaceae species NOT DETECTED NOT DETECTED   Enterobacter cloacae complex NOT DETECTED NOT DETECTED   Escherichia coli NOT DETECTED NOT DETECTED   Klebsiella oxytoca NOT DETECTED NOT DETECTED   Klebsiella pneumoniae NOT DETECTED NOT DETECTED   Proteus species NOT DETECTED NOT DETECTED   Serratia marcescens NOT DETECTED NOT DETECTED   Haemophilus influenzae NOT DETECTED NOT DETECTED   Neisseria meningitidis NOT DETECTED NOT DETECTED   Pseudomonas aeruginosa NOT DETECTED NOT DETECTED   Candida albicans NOT DETECTED NOT DETECTED   Candida glabrata NOT DETECTED NOT DETECTED   Candida krusei NOT DETECTED NOT DETECTED   Candida parapsilosis NOT DETECTED NOT DETECTED   Candida tropicalis NOT DETECTED NOT DETECTED    Name of physician (or Provider) ContactedMarland Kitchen Tylene Fantasia, NP  Changes to prescribed antibiotics required: none, await final sensitivities since hx ESBL  Biagio Borg 06-04-2017  8:02 PM

## 2016-12-01 NOTE — Progress Notes (Signed)
Pharmacy Antibiotic Note  Carrie Atkins is a 83 y.o. female admitted on 11/30/2016 with sepsis.  Pharmacy has been consulted for zosyn dosing.  Plan: Zosyn 3.375g IV q8h (4 hour infusion).  Height: 5\' 1"  (154.9 cm) Weight: 180 lb (81.6 kg) IBW/kg (Calculated) : 47.8  Temp (24hrs), Avg:101.4 F (38.6 C), Min:101 F (38.3 C), Max:101.7 F (38.7 C)   Recent Labs Lab 11/30/16 2342 11/30/16 2355  WBC 26.5*  --   CREATININE 2.00*  --   LATICACIDVEN  --  3.55*    Estimated Creatinine Clearance: 21.3 mL/min (A) (by C-G formula based on SCr of 2 mg/dL (H)).    Allergies  Allergen Reactions  . Sulfonamide Derivatives Nausea And Vomiting  . Hydrocodone Nausea And Vomiting  . Sulfa Antibiotics Nausea And Vomiting    Antimicrobials this admission: Vancomycin 12-12-2016 x1 Zosyn 12-12-2016 >>   Dose adjustments this admission: -  Microbiology results: pending  Thank you for allowing pharmacy to be a part of this patient's care.  Nani Skillern Crowford 12-12-2016 5:03 AM

## 2016-12-01 NOTE — ED Provider Notes (Signed)
Patient seen/examined in the Emergency Department in conjunction with Midlevel Provider [facility] Patient reports fever, abdominal pain.  Family reports recent confusion Exam : awake/alert, no confusion noted.  No focal abdominal tenderness.    Plan: admit.  Sepsis workup initiated    Ripley Fraise, MD 12/01/16 [phone]

## 2016-12-01 NOTE — ED Notes (Signed)
Pharmacy called to verify medications.

## 2016-12-01 NOTE — ED Notes (Signed)
Pt moved from er stretcher to hospital bed.

## 2016-12-02 ENCOUNTER — Inpatient Hospital Stay (HOSPITAL_COMMUNITY): Payer: Medicare Other

## 2016-12-02 DIAGNOSIS — R062 Wheezing: Secondary | ICD-10-CM

## 2016-12-02 DIAGNOSIS — Z8679 Personal history of other diseases of the circulatory system: Secondary | ICD-10-CM

## 2016-12-02 DIAGNOSIS — R0682 Tachypnea, not elsewhere classified: Secondary | ICD-10-CM

## 2016-12-02 DIAGNOSIS — E872 Acidosis, unspecified: Secondary | ICD-10-CM

## 2016-12-02 DIAGNOSIS — N390 Urinary tract infection, site not specified: Secondary | ICD-10-CM

## 2016-12-02 DIAGNOSIS — R7881 Bacteremia: Secondary | ICD-10-CM

## 2016-12-02 DIAGNOSIS — R0602 Shortness of breath: Secondary | ICD-10-CM

## 2016-12-02 DIAGNOSIS — D72829 Elevated white blood cell count, unspecified: Secondary | ICD-10-CM

## 2016-12-02 LAB — CBC
HEMATOCRIT: 30.5 % — AB (ref 36.0–46.0)
Hemoglobin: 10 g/dL — ABNORMAL LOW (ref 12.0–15.0)
MCH: 30.7 pg (ref 26.0–34.0)
MCHC: 32.8 g/dL (ref 30.0–36.0)
MCV: 93.6 fL (ref 78.0–100.0)
PLATELETS: 100 10*3/uL — AB (ref 150–400)
RBC: 3.26 MIL/uL — ABNORMAL LOW (ref 3.87–5.11)
RDW: 13.9 % (ref 11.5–15.5)
WBC: 20.5 10*3/uL — AB (ref 4.0–10.5)

## 2016-12-02 LAB — C DIFFICILE QUICK SCREEN W PCR REFLEX
C DIFFICILE (CDIFF) TOXIN: NEGATIVE
C Diff antigen: NEGATIVE
C Diff interpretation: NOT DETECTED

## 2016-12-02 LAB — GLUCOSE, CAPILLARY
GLUCOSE-CAPILLARY: 193 mg/dL — AB (ref 65–99)
GLUCOSE-CAPILLARY: 41 mg/dL — AB (ref 65–99)
GLUCOSE-CAPILLARY: 49 mg/dL — AB (ref 65–99)
GLUCOSE-CAPILLARY: 150 mg/dL — AB (ref 65–99)
GLUCOSE-CAPILLARY: 103 mg/dL — AB (ref 65–99)
Glucose-Capillary: 128 mg/dL — ABNORMAL HIGH (ref 65–99)
Glucose-Capillary: 102 mg/dL — ABNORMAL HIGH (ref 65–99)

## 2016-12-02 LAB — BASIC METABOLIC PANEL
ANION GAP: 9 (ref 5–15)
BUN: 47 mg/dL — ABNORMAL HIGH (ref 6–20)
CALCIUM: 8.1 mg/dL — AB (ref 8.9–10.3)
CO2: 22 mmol/L (ref 22–32)
Chloride: 108 mmol/L (ref 101–111)
Creatinine, Ser: 2.16 mg/dL — ABNORMAL HIGH (ref 0.44–1.00)
GFR, EST AFRICAN AMERICAN: 23 mL/min — AB (ref >60)
GFR, EST NON AFRICAN AMERICAN: 20 mL/min — AB (ref >60)
Glucose, Bld: 58 mg/dL — ABNORMAL LOW (ref 65–99)
Potassium: 3.5 mmol/L (ref 3.5–5.1)
Sodium: 139 mmol/L (ref 135–145)

## 2016-12-02 LAB — LACTIC ACID, PLASMA: Lactic Acid, Venous: 1 mmol/L (ref 0.5–1.9)

## 2016-12-02 LAB — UREA NITROGEN, URINE: UREA NITROGEN UR: 386 mg/dL

## 2016-12-02 MED ORDER — ALBUTEROL SULFATE (2.5 MG/3ML) 0.083% IN NEBU
2.5000 mg | INHALATION_SOLUTION | RESPIRATORY_TRACT | Status: DC | PRN
  Administered 2016-12-02 – 2016-12-03 (×3): 2.5 mg via RESPIRATORY_TRACT
  Filled 2016-12-02 (×3): qty 3

## 2016-12-02 MED ORDER — FUROSEMIDE 10 MG/ML IJ SOLN
20.0000 mg | Freq: Once | INTRAMUSCULAR | Status: AC
  Administered 2016-12-02: 20 mg via INTRAVENOUS
  Filled 2016-12-02: qty 2

## 2016-12-02 MED ORDER — INSULIN GLARGINE 100 UNIT/ML ~~LOC~~ SOLN
25.0000 [IU] | Freq: Every day | SUBCUTANEOUS | Status: DC
  Administered 2016-12-02: 25 [IU] via SUBCUTANEOUS
  Filled 2016-12-02: qty 0.25

## 2016-12-02 MED ORDER — ALBUTEROL SULFATE (2.5 MG/3ML) 0.083% IN NEBU
2.5000 mg | INHALATION_SOLUTION | Freq: Four times a day (QID) | RESPIRATORY_TRACT | Status: DC
  Administered 2016-12-02 – 2016-12-03 (×4): 2.5 mg via RESPIRATORY_TRACT
  Filled 2016-12-02 (×4): qty 3

## 2016-12-02 NOTE — Progress Notes (Signed)
Inpatient Diabetes Program Recommendations  AACE/ADA: New Consensus Statement on Inpatient Glycemic Control (2015)  Target Ranges:  Prepandial:   less than 140 mg/dL      Peak postprandial:   less than 180 mg/dL (1-2 hours)      Critically ill patients:  140 - 180 mg/dL   Lab Results  Component Value Date   GLUCAP 150 (H) 12/02/2016   HGBA1C 12.6 (H) 09/18/2016    Review of Glycemic Control  Home meds: Amaryl 8 mg QD, Lantus 35 units QHS Humalog 0-11 units tidwc and hs  Hypoglycemia in am x 2 days. Received Lantus 35 units at 0630 and 2252, on 6/19. Received Lantus 25 units QHS on 6/20.  HgbA1C reveals poor glycemic control at home, although questionable accuracy with H/H low.(9.6 Hgb) Lantus decreased to 15 units QHS to start 6/21. Eating 100% of meals.  Will likely need insulin adjustments at discharge.  Thank you. Lorenda Peck, RD, LDN, CDE Inpatient Diabetes Coordinator [phone]

## 2016-12-02 NOTE — Progress Notes (Addendum)
PROGRESS NOTE    Carrie Atkins  GYB:[id number] DOB: 07/16/1933 DOA: 11/30/2016 PCP: [name], MD   Chief Complaint  Patient presents with  . Fever  . Altered Mental Status  . Abdominal Pain   Brief Narrative:  on 06/09/202018 by Dr. Sherlon Handing Siegneris a 83 y.o.femalewith a past medical history significant for CLL, IDDM, HTN, and ESBL UTIwho presents with confusion for 1 day, dysuria and malaise. The patient was in her usual state of health until about the last few days when she started to notice some dysuria and suprapubic or periumbilical vague mild abdominal discomfort. Then today, throughout the day, her daughter noticed that she was intermittently confused, and finally this evening she was febrile and too weak to get out of a chair, and so her daughter called EMS. Assessment & Plan   Severe sepsis secondary to [facility] urinary tract infection/Proteus bacteremia  -Upon admission, patient was tachypneic, febrile with leukocytosis and had elevated lactic acid level. Patient also had acute kidney injury -Lactic acid improving, currently 1 (3.55 upon admission) -CXR unremarkable for infection -UA: Many bacteria, TNTC WBC, large leukocytes -6/18: Blood Culture showed 2/2 Proteus -Urine culture >100 Ecoli -Will repeat blood cultures -Continue IV fluids, IV antibiotics with Zosyn  Acute kidney injury -Baseline creatinine 0.8, upon admission creatinine was 2 -Despite IV fluids, creatinine 2.16 -Will order renal ultrasound -Will obtain urine electrolytes -Suspect secondary to sepsis and prerenal causes -Continue IV fluids and monitor BMP  Acute on Chronic diastolic heart failure -oral lasix held -Echocardiogram 09/18/2016 showed an EF of 87-68%, grade 1 diastolic dysfunction -Monitor intake and output, daily weights -patient given IVF for sepsis, developed wheezing and SOB overnight -CXR obtained showing Mild CHF                   -Will give one dose of  lasix (20mg ) IV, given AKI                                                                                                                             Hyponatremia -Resolved, with IV fluids  -continue to monitor BMP  Normocytic anemia -Hemoglobin currently 10, baseline approximately 8-9 -Continue iron supplementation and monitor CBC  Diabetes mellitus, type II -Hold humalog, amaryl -Continue Lantus, insulin sliding scale, CBG monitoring  Essential hypertension -Continue Coreg  Atrial fibrillation, paroxysmal -Continue Coreg, aspirin -Currently patient in sinus rhythm -CHADSVASC 5 -Not on anticoagulation  Depression -Continue sertraline  GERD -Continue H2 blocker  Diarrhea -C. difficile negative  DVT Prophylaxis  lovenox  Code Status: DNR  Family Communication: None at bedside  Disposition Plan: Admitted. Continue to treat sepsis.   Consultants None  Procedures  Renal US   Antibiotics   Anti-infectives    Start     Dose/Rate Route Frequency Ordered Stop   12/01/16 0600  piperacillin-tazobactam (ZOSYN) IVPB 3.375 g     3.375 g 12.5 mL/hr over 240 Minutes Intravenous Every  8 hours 12/01/16 0501     12/01/16 0015  piperacillin-tazobactam (ZOSYN) IVPB 3.375 g     3.375 g 100 mL/hr over 30 Minutes Intravenous  Once 12/01/16 0012 12/01/16 0126   12/01/16 0015  vancomycin (VANCOCIN) IVPB 1000 mg/200 mL premix     1,000 mg 200 mL/hr over 60 Minutes Intravenous  Once 12/01/16 0012 12/01/16 0241      Subjective:   Bill Salinas seen and examined today.  Patient states she's feeling mildly better today. Denies any chest pain, shortness breath, abdominal pain, nausea vomiting, diarrhea or constipation. Does continue to feel weak..    Objective:   Vitals:   12/01/16 2117 12/02/16 0357 12/02/16 0646 12/02/16 1512  BP: (!) 107/54 (!) 126/58  121/67  Pulse: 69 69  68  Resp: (!) 25 (!) 26    Temp: 98.4 F (36.9 C) 98.5 F (36.9 C)  99.2 F (37.3 C)   TempSrc: Oral Oral  Oral  SpO2: 99% 97% 96% 100%  Weight:      Height:        Intake/Output Summary (Last 24 hours) at 12/02/16 1711 Last data filed at 12/02/16 1333  Gross per 24 hour  Intake             2140 ml  Output                0 ml  Net             2140 ml   Filed Weights   11/30/16 2330 12/01/16 1049  Weight: 81.6 kg (180 lb) 87.2 kg (192 lb 3.9 oz)   Exam  General: Well developed, well nourished, NAD, appears stated age  HEENT: NCAT, mucous membranes moist.   Cardiovascular: S1 S2 auscultated, soft SEM, RRR  Respiratory: Clear to auscultation bilaterally with equal chest rise  Abdomen: Soft, nontender, nondistended, + bowel sounds  Extremities: warm dry without cyanosis clubbing or edema  Neuro: AAOx3, nonfocal  Psych: Appropriate   Data Reviewed: I have personally reviewed following labs and imaging studies  CBC:  Recent Labs Lab 11/30/16 2342 12/01/16 0515 12/02/16 0710  WBC 26.5* 23.1* 20.5*  NEUTROABS 23.8*  --   --   HGB 11.4* 10.8* 10.0*  HCT 34.6* 32.9* 30.5*  MCV 92.0 92.4 93.6  PLT 142* 127* 578*   Basic Metabolic Panel:  Recent Labs Lab 11/30/16 2342 12/01/16 0515 12/02/16 0710  NA 134* 137 139  K 4.2 4.2 3.5  CL 99* 105 108  CO2 23 22 22   GLUCOSE 282* 233* 58*  BUN 40* 39* 47*  CREATININE 2.00* 2.02* 2.16*  CALCIUM 9.0 8.0* 8.1*   GFR: Estimated Creatinine Clearance: 20.5 mL/min (A) (by C-G formula based on SCr of 2.16 mg/dL (H)). Liver Function Tests:  Recent Labs Lab 11/30/16 2342  AST 25  ALT 20  ALKPHOS 68  BILITOT 0.7  PROT 6.9  ALBUMIN 3.6   No results for input(s): LIPASE, AMYLASE in the last 168 hours. No results for input(s): AMMONIA in the last 168 hours. Coagulation Profile:  Recent Labs Lab 11/30/16 2342  INR 1.07   Cardiac Enzymes: No results for input(s): CKTOTAL, CKMB, CKMBINDEX, TROPONINI in the last 168 hours. BNP (last 3 results) No results for input(s): PROBNP in the last 8760  hours. HbA1C: No results for input(s): HGBA1C in the last 72 hours. CBG:  Recent Labs Lab 12/01/16 2115 12/02/16 0806 12/02/16 0841 12/02/16 0950 12/02/16 1127  GLUCAP 178* 41* 49* 150* 128*  Lipid Profile: No results for input(s): CHOL, HDL, LDLCALC, TRIG, CHOLHDL, LDLDIRECT in the last 72 hours. Thyroid Function Tests: No results for input(s): TSH, T4TOTAL, FREET4, T3FREE, THYROIDAB in the last 72 hours. Anemia Panel: No results for input(s): VITAMINB12, FOLATE, FERRITIN, TIBC, IRON, RETICCTPCT in the last 72 hours. Urine analysis:    Component Value Date/Time   COLORURINE YELLOW 11/30/2016 2333   APPEARANCEUR HAZY (A) 11/30/2016 2333   LABSPEC 1.020 11/30/2016 2333   PHURINE 5.0 11/30/2016 2333   GLUCOSEU NEGATIVE 11/30/2016 2333   HGBUR MODERATE (A) 11/30/2016 2333   BILIRUBINUR NEGATIVE 11/30/2016 2333   KETONESUR NEGATIVE 11/30/2016 2333   PROTEINUR NEGATIVE 11/30/2016 2333   NITRITE NEGATIVE 11/30/2016 2333   LEUKOCYTESUR LARGE (A) 11/30/2016 2333   Sepsis Labs: @LABRCNTIP (procalcitonin:4,lacticidven:4)  ) Recent Results (from the past 240 hour(s))  Urine culture     Status: Abnormal (Preliminary result)   Collection Time: 11/30/16 11:33 PM  Result Value Ref Range Status   Specimen Description URINE, CATHETERIZED  Final   Special Requests NONE  Final   Culture >=100,000 COLONIES/mL ESCHERICHIA COLI (A)  Final   Report Status PENDING  Incomplete  Culture, blood (Routine x 2)     Status: Abnormal (Preliminary result)   Collection Time: 11/30/16 11:42 PM  Result Value Ref Range Status   Specimen Description BLOOD LEFT WRIST  Final   Special Requests   Final    BOTTLES DRAWN AEROBIC AND ANAEROBIC Blood Culture adequate volume   Culture  Setup Time   Final    GRAM NEGATIVE RODS IN BOTH AEROBIC AND ANAEROBIC BOTTLES CRITICAL RESULT CALLED TO, READ BACK BY AND VERIFIED WITH: [place] 12/01/16 A BROWNING    Culture (A)  Final    PROTEUS  MIRABILIS SUSCEPTIBILITIES TO FOLLOW Performed at [facility], [place] [address], [place], [place] [address]    Report Status PENDING  Incomplete  Blood Culture ID Panel (Reflexed)     Status: Abnormal   Collection Time: 11/30/16 11:42 PM  Result Value Ref Range Status   Enterococcus species NOT DETECTED NOT DETECTED Final   Listeria monocytogenes NOT DETECTED NOT DETECTED Final   Staphylococcus species NOT DETECTED NOT DETECTED Final   Staphylococcus aureus NOT DETECTED NOT DETECTED Final   Streptococcus species NOT DETECTED NOT DETECTED Final   Streptococcus agalactiae NOT DETECTED NOT DETECTED Final   Streptococcus pneumoniae NOT DETECTED NOT DETECTED Final   Streptococcus pyogenes NOT DETECTED NOT DETECTED Final   Acinetobacter baumannii NOT DETECTED NOT DETECTED Final   Enterobacteriaceae species DETECTED (A) NOT DETECTED Final    Comment: Enterobacteriaceae represent a large family of gram-negative bacteria, not a single organism. CRITICAL RESULT CALLED TO, READ BACK BY AND VERIFIED WITH: [place] Sl[place] PHARMD [id number] 12/01/16 A BROWNING    Enterobacter cloacae complex NOT DETECTED NOT DETECTED Final   Escherichia coli NOT DETECTED NOT DETECTED Final   Klebsiella oxytoca NOT DETECTED NOT DETECTED Final   Klebsiella pneumoniae NOT DETECTED NOT DETECTED Final   Proteus species DETECTED (A) NOT DETECTED Final    Comment: CRITICAL RESULT CALLED TO, READ BACK BY AND VERIFIED WITH: [place] Slider PHARMD [id number] 12/01/16 A BROWNING    Serratia marcescens NOT DETECTED NOT DETECTED Final   Carbapenem resistance NOT DETECTED NOT DETECTED Final   Haemophilus influenzae NOT DETECTED NOT DETECTED Final   Neisseria meningitidis NOT DETECTED NOT DETECTED Final   Pseudomonas aeruginosa NOT DETECTED NOT DETECTED Final   Candida albicans NOT DETECTED NOT DETECTED Final  Candida glabrata NOT DETECTED NOT DETECTED Final   Candida krusei NOT DETECTED NOT DETECTED Final   Candida parapsilosis NOT  DETECTED NOT DETECTED Final   Candida tropicalis NOT DETECTED NOT DETECTED Final    Comment: Performed at [facility], [place] [address], [place], [place] [address]  Culture, blood (Routine x 2)     Status: Abnormal (Preliminary result)   Collection Time: 12/01/16 12:23 AM  Result Value Ref Range Status   Specimen Description BLOOD RIGHT HAND  Final   Special Requests IN PEDIATRIC BOTTLE Blood Culture adequate volume  Final   Culture  Setup Time   Final    GRAM NEGATIVE RODS IN PEDIATRIC BOTTLE CRITICAL RESULT CALLED TO, READ BACK BY AND VERIFIED WITH: [place] 12/01/16 A BROWNING Performed at [facility], [place] [address] [place], [place] [address]    Culture PROTEUS MIRABILIS (A)  Final   Report Status PENDING  Incomplete  C difficile quick scan w PCR reflex     Status: None   Collection Time: 12/02/16  1:25 PM  Result Value Ref Range Status   C Diff antigen NEGATIVE NEGATIVE Final   C Diff toxin NEGATIVE NEGATIVE Final   C Diff interpretation No C. difficile detected.  Final      Radiology Studies: Dg Chest 2 View  Result Date: 2020/04/2417 CLINICAL DATA:  83 year old female with shortness of breath. EXAM: CHEST  2 VIEW COMPARISON:  Chest radiograph dated 09/17/2016 FINDINGS: There is shallow inspiration with minimal bibasilar atelectatic changes. No focal consolidation, pleural effusion, or pneumothorax. Top-normal cardiac size. No acute osseous pathology. IMPRESSION: Shallow inspiration.  No focal consolidation. Electronically Signed   By: Anner Crete M.D.   On: 02020/04/2417 00:24   Dg Chest Port 1 View  Result Date: 12/02/2016 CLINICAL DATA:  Increased wheezing and shortness of breath. Tachypnea. History CHF. EXAM: PORTABLE CHEST 1 VIEW COMPARISON:  Frontal and lateral views 02020/04/2417 FINDINGS: The heart is enlarged. There is developing vascular congestion. Minimal perihilar pulmonary edema suspected. No large pleural effusion, focal consolidation, or  pneumothorax. Stable osseous structures. IMPRESSION: Cardiomegaly with developing vascular congestion and mild perihilar edema, consistent with mild CHF. Electronically Signed   By: Jeb Levering M.D.   On: 12/02/2016 06:54     Scheduled Meds: . albuterol  2.5 mg Nebulization QID  . aspirin EC  81 mg Oral Daily  . carvedilol  6.25 mg Oral BID WC  . enoxaparin (LOVENOX) injection  30 mg Subcutaneous Q24H  . famotidine  20 mg Oral QHS  . ferrous sulfate  325 mg Oral Q breakfast  . insulin aspart  0-15 Units Subcutaneous TID WC  . insulin aspart  0-5 Units Subcutaneous QHS  . insulin glargine  35 Units Subcutaneous Q2200  . sertraline  100 mg Oral QPC breakfast  . sodium chloride flush  3 mL Intravenous Q12H   Continuous Infusions: . piperacillin-tazobactam (ZOSYN)  IV Stopped (12/02/16 1733)     LOS: 1 day   Time Spent in minutes   35 minutes  Filippa Yarbough D.O. on 12/02/2016 at 5:11 PM  Between 7am to 7pm - Pager - [phone]  After 7pm go to [email or web address] - password [email or web address]  And look for the night coverage person covering for me after hours  [facility] Office  [phone]

## 2016-12-02 NOTE — Progress Notes (Signed)
CBG 41 @ 0817, asymptomatic.  Juice given, recheck CBG 49. Gave more juice and pt eating breakfast.  Dr. Ree Kida aware.   Recheck @ 0950 CBG 150.   Will continue to monitor.

## 2016-12-03 LAB — CULTURE, BLOOD (ROUTINE X 2)
SPECIAL REQUESTS: ADEQUATE
Special Requests: ADEQUATE

## 2016-12-03 LAB — GLUCOSE, CAPILLARY
GLUCOSE-CAPILLARY: 60 mg/dL — AB (ref 65–99)
GLUCOSE-CAPILLARY: 86 mg/dL (ref 65–99)
GLUCOSE-CAPILLARY: 131 mg/dL — AB (ref 65–99)
Glucose-Capillary: 90 mg/dL (ref 65–99)
Glucose-Capillary: 109 mg/dL — ABNORMAL HIGH (ref 65–99)

## 2016-12-03 LAB — CBC
HEMATOCRIT: 28.8 % — AB (ref 36.0–46.0)
Hemoglobin: 9.4 g/dL — ABNORMAL LOW (ref 12.0–15.0)
MCH: 29.8 pg (ref 26.0–34.0)
MCHC: 32.6 g/dL (ref 30.0–36.0)
MCV: 91.4 fL (ref 78.0–100.0)
Platelets: 106 10*3/uL — ABNORMAL LOW (ref 150–400)
RBC: 3.15 MIL/uL — AB (ref 3.87–5.11)
RDW: 13.9 % (ref 11.5–15.5)
WBC: 16.3 10*3/uL — ABNORMAL HIGH (ref 4.0–10.5)

## 2016-12-03 LAB — BASIC METABOLIC PANEL
Anion gap: 9 (ref 5–15)
BUN: 39 mg/dL — AB (ref 6–20)
CHLORIDE: 107 mmol/L (ref 101–111)
CO2: 20 mmol/L — AB (ref 22–32)
CREATININE: 1.8 mg/dL — AB (ref 0.44–1.00)
Calcium: 8.2 mg/dL — ABNORMAL LOW (ref 8.9–10.3)
GFR calc Af Amer: 29 mL/min — ABNORMAL LOW (ref >60)
GFR calc non Af Amer: 25 mL/min — ABNORMAL LOW (ref >60)
Glucose, Bld: 65 mg/dL (ref 65–99)
POTASSIUM: 3.6 mmol/L (ref 3.5–5.1)
Sodium: 136 mmol/L (ref 135–145)

## 2016-12-03 LAB — NA AND K (SODIUM & POTASSIUM), RAND UR
POTASSIUM UR: 32 mmol/L
Sodium, Ur: 36 mmol/L

## 2016-12-03 LAB — URINE CULTURE

## 2016-12-03 LAB — CREATININE, URINE, RANDOM: Creatinine, Urine: 87.75 mg/dL

## 2016-12-03 MED ORDER — FUROSEMIDE 10 MG/ML IJ SOLN
40.0000 mg | Freq: Once | INTRAMUSCULAR | Status: AC
  Administered 2016-12-03: 40 mg via INTRAVENOUS
  Filled 2016-12-03: qty 4

## 2016-12-03 MED ORDER — IPRATROPIUM-ALBUTEROL 0.5-2.5 (3) MG/3ML IN SOLN
3.0000 mL | Freq: Four times a day (QID) | RESPIRATORY_TRACT | Status: DC
  Administered 2016-12-03 – 2016-12-05 (×9): 3 mL via RESPIRATORY_TRACT
  Filled 2016-12-03 (×9): qty 3

## 2016-12-03 MED ORDER — GUAIFENESIN ER 600 MG PO TB12
600.0000 mg | ORAL_TABLET | Freq: Two times a day (BID) | ORAL | Status: DC
  Administered 2016-12-03 – 2016-12-09 (×13): 600 mg via ORAL
  Filled 2016-12-03 (×13): qty 1

## 2016-12-03 MED ORDER — INSULIN GLARGINE 100 UNIT/ML ~~LOC~~ SOLN
15.0000 [IU] | Freq: Every day | SUBCUTANEOUS | Status: DC
  Administered 2016-12-03 – 2016-12-08 (×6): 15 [IU] via SUBCUTANEOUS
  Filled 2016-12-03 (×7): qty 0.15

## 2016-12-03 MED ORDER — SODIUM CHLORIDE 0.9 % IV SOLN
3.0000 g | Freq: Two times a day (BID) | INTRAVENOUS | Status: DC
  Administered 2016-12-03 – 2016-12-04 (×3): 3 g via INTRAVENOUS
  Filled 2016-12-03 (×4): qty 3

## 2016-12-03 NOTE — Care Management Note (Signed)
Case Management Note  Patient Details  Name: Carrie Atkins MRN: [id number] Date of Birth: 1934-06-03  Subjective/Objective:    Sepsis                Action/Plan: Will follow for discharge plan   Expected Discharge Date:   (unknown)               Expected Discharge Plan:  [place]  In-House Referral:  Clinical Social Work  Discharge planning Services  CM Consult  Post Acute Care Choice:    Choice offered to:     DME Arranged:    DME Agency:     HH Arranged:    [place] Agency:     Status of Service:  In process, will continue to follow  If discussed at Long Length of Stay Meetings, dates discussed:    Additional CommentsPurcell Mouton, RN 12/03/2016, 3:31 PM

## 2016-12-03 NOTE — Progress Notes (Signed)
PROGRESS NOTE    Carrie Atkins   PFX:[id number]  DOB: 03/05/1934  DOA: 11/30/2016 PCP: [name], MD   Brief Narrative:  Carrie Dockendorf Siegneris a 83 y.o.femalewith a past medical history significant for CLL, IDDM, HTN, and ESBL UTIwho presents with confusion for 1 day, dysuria and malaise. She was found to have a UTI and admitted to the hospital for further management. She has since grown eEColi in her urine and Proteus in her blood.   Subjective: Mildly congested. Has a cough. No shortness of breath. No dysuria or pain.   Assessment & Plan:   Principal Problem:   Severe Sepsis due to E coli UTI and Proteus bacteremia - fever 101.1, RR elevated, WBC 26.5, Lactic acid 3.55 - repeat cultures still negative, leukocytosis improved - lactic acid normalized - confusion resolved - can switch Zosyn to Unasyn to which both organisms are sensitive- can transition to Augmentin on discharge - on note she was admitted on 4/18 for septic shock from ESBL e coli  Active Problems:   Tachypnea   Wheezing - CXR suggestive of pulmonary edema- no focal infiltrates - probably acute bronchitis? - cont Nebs- add Mucinex - give another dose of Lasix today    AKI - baseline is about 0.8- Cr 2.00 on admission - Cr mildly improved today - cont to follow with diuretics - renal ultrasound shows mild R hydronephrosis, lower pole renal stone (non obstructing) and mild atropy of R kidney  Acute on chronic dCHF Echocardiogram 09/18/2016 showed an EF of 32-99%, grade 1 diastolic dysfunction - will give another dose of Lasix today and follow fluid status and Cr    HTN (hypertension) - hold Coreg today in setting of acute CHF and wheezing       Paroxysmal atrial fibrillation - Hold Coreg for today - not on anticoagulation at home  CLL  DM2- hypoglycemia due to poor oral intake - A1c 12.6 on 4/18 - Amaryl, Lantus and SSI at home - cut back on Lantus again today due to ongoing issues with  hypoglycemia- takes 35 U at home   Diarrhea - was neg for C diff   DVT prophylaxis: Lovenox  Code Status: DNR Family Communication:  Disposition Plan:  Has been weak at home-  Obtain PT eval for disposition Consultants:     Procedures:     Antimicrobials:  Anti-infectives    Start     Dose/Rate Route Frequency Ordered Stop   12/01/16 0600  piperacillin-tazobactam (ZOSYN) IVPB 3.375 g     3.375 g 12.5 mL/hr over 240 Minutes Intravenous Every 8 hours 12/01/16 0501     12/01/16 0015  piperacillin-tazobactam (ZOSYN) IVPB 3.375 g     3.375 g 100 mL/hr over 30 Minutes Intravenous  Once 12/01/16 0012 12/01/16 0126   12/01/16 0015  vancomycin (VANCOCIN) IVPB 1000 mg/200 mL premix     1,000 mg 200 mL/hr over 60 Minutes Intravenous  Once 12/01/16 0012 12/01/16 0241       Objective: Vitals:   12/03/16 0451 12/03/16 0744 12/03/16 1031 12/03/16 1455  BP: 124/71   120/70  Pulse: 66 65  72  Resp: (!) 23 (!) 22 20 20   Temp: 98.5 F (36.9 C)   98.6 F (37 C)  TempSrc: Oral   Oral  SpO2: 100% 98% 98% 100%  Weight: 90.2 kg (198 lb 13.7 oz)     Height:        Intake/Output Summary (Last 24 hours) at 12/03/16 1500 Last data  filed at 12/03/16 1232  Gross per 24 hour  Intake              770 ml  Output              300 ml  Net              470 ml   Filed Weights   11/30/16 2330 12/01/16 1049 12/03/16 0451  Weight: 81.6 kg (180 lb) 87.2 kg (192 lb 3.9 oz) 90.2 kg (198 lb 13.7 oz)    Examination: General exam: Appears comfortable  HEENT: PERRLA, oral mucosa moist, no sclera icterus or thrush Respiratory system: Clear to auscultation. Respiratory effort normal. Cardiovascular system: S1 & S2 heard, RRR.  No murmurs  Gastrointestinal system: Abdomen soft, non-tender, nondistended. Normal bowel sound. No organomegaly Central nervous system: Alert and oriented. No focal neurological deficits. Extremities: No cyanosis, clubbing or edema Skin: No rashes or ulcers Psychiatry:   Mood & affect appropriate.     Data Reviewed: I have personally reviewed following labs and imaging studies  CBC:  Recent Labs Lab 11/30/16 2342 12/01/16 0515 12/02/16 0710 12/03/16 0547  WBC 26.5* 23.1* 20.5* 16.3*  NEUTROABS 23.8*  --   --   --   HGB 11.4* 10.8* 10.0* 9.4*  HCT 34.6* 32.9* 30.5* 28.8*  MCV 92.0 92.4 93.6 91.4  PLT 142* 127* 100* 458*   Basic Metabolic Panel:  Recent Labs Lab 11/30/16 2342 12/01/16 0515 12/02/16 0710 12/03/16 0547  NA 134* 137 139 136  K 4.2 4.2 3.5 3.6  CL 99* 105 108 107  CO2 23 22 22  20*  GLUCOSE 282* 233* 58* 65  BUN 40* 39* 47* 39*  CREATININE 2.00* 2.02* 2.16* 1.80*  CALCIUM 9.0 8.0* 8.1* 8.2*   GFR: Estimated Creatinine Clearance: 25.1 mL/min (A) (by C-G formula based on SCr of 1.8 mg/dL (H)). Liver Function Tests:  Recent Labs Lab 11/30/16 2342  AST 25  ALT 20  ALKPHOS 68  BILITOT 0.7  PROT 6.9  ALBUMIN 3.6   No results for input(s): LIPASE, AMYLASE in the last 168 hours. No results for input(s): AMMONIA in the last 168 hours. Coagulation Profile:  Recent Labs Lab 11/30/16 2342  INR 1.07   Cardiac Enzymes: No results for input(s): CKTOTAL, CKMB, CKMBINDEX, TROPONINI in the last 168 hours. BNP (last 3 results) No results for input(s): PROBNP in the last 8760 hours. HbA1C: No results for input(s): HGBA1C in the last 72 hours. CBG:  Recent Labs Lab 12/02/16 1721 12/02/16 2105 12/03/16 0751 12/03/16 0814 12/03/16 1153  GLUCAP 102* 103* 60* 86 131*   Lipid Profile: No results for input(s): CHOL, HDL, LDLCALC, TRIG, CHOLHDL, LDLDIRECT in the last 72 hours. Thyroid Function Tests: No results for input(s): TSH, T4TOTAL, FREET4, T3FREE, THYROIDAB in the last 72 hours. Anemia Panel: No results for input(s): VITAMINB12, FOLATE, FERRITIN, TIBC, IRON, RETICCTPCT in the last 72 hours. Urine analysis:    Component Value Date/Time   COLORURINE YELLOW 11/30/2016 2333   APPEARANCEUR HAZY (A) 11/30/2016  2333   LABSPEC 1.020 11/30/2016 2333   PHURINE 5.0 11/30/2016 2333   GLUCOSEU NEGATIVE 11/30/2016 2333   HGBUR MODERATE (A) 11/30/2016 2333   BILIRUBINUR NEGATIVE 11/30/2016 2333   KETONESUR NEGATIVE 11/30/2016 2333   PROTEINUR NEGATIVE 11/30/2016 2333   NITRITE NEGATIVE 11/30/2016 2333   LEUKOCYTESUR LARGE (A) 11/30/2016 2333   Sepsis Labs: @LABRCNTIP (procalcitonin:4,lacticidven:4) ) Recent Results (from the past 240 hour(s))  Urine culture     Status: Abnormal  Collection Time: 11/30/16 11:33 PM  Result Value Ref Range Status   Specimen Description URINE, CATHETERIZED  Final   Special Requests NONE  Final   Culture (A)  Final    >=100,000 COLONIES/mL ESCHERICHIA COLI Confirmed Extended Spectrum Beta-Lactamase Producer (ESBL) Performed at [facility], [address], [place], [place] [address]    Report Status 12/03/2016 FINAL  Final   Organism ID, Bacteria ESCHERICHIA COLI (A)  Final      Susceptibility   Escherichia coli - MIC*    AMPICILLIN >=32 RESISTANT Resistant     CEFAZOLIN >=64 RESISTANT Resistant     CEFTRIAXONE >=64 RESISTANT Resistant     CIPROFLOXACIN >=4 RESISTANT Resistant     GENTAMICIN >=16 RESISTANT Resistant     IMIPENEM <=0.25 SENSITIVE Sensitive     NITROFURANTOIN <=16 SENSITIVE Sensitive     TRIMETH/SULFA >=320 RESISTANT Resistant     AMPICILLIN/SULBACTAM 8 SENSITIVE Sensitive     PIP/TAZO <=4 SENSITIVE Sensitive     Extended ESBL POSITIVE Resistant     * >=100,000 COLONIES/mL ESCHERICHIA COLI  Culture, blood (Routine x 2)     Status: Abnormal   Collection Time: 11/30/16 11:42 PM  Result Value Ref Range Status   Specimen Description BLOOD LEFT WRIST  Final   Special Requests   Final    BOTTLES DRAWN AEROBIC AND ANAEROBIC Blood Culture adequate volume   Culture  Setup Time   Final    GRAM NEGATIVE RODS IN BOTH AEROBIC AND ANAEROBIC BOTTLES CRITICAL RESULT CALLED TO, READ BACK BY AND VERIFIED WITH: [place] 12/01/16 A  BROWNING Performed at [facility], [address], [place],  [address]    Culture PROTEUS MIRABILIS (A)  Final   Report Status 12/03/2016 FINAL  Final   Organism ID, Bacteria PROTEUS MIRABILIS  Final      Susceptibility   Proteus mirabilis - MIC*    AMPICILLIN <=2 SENSITIVE Sensitive     CEFAZOLIN <=4 SENSITIVE Sensitive     CEFEPIME <=1 SENSITIVE Sensitive     CEFTAZIDIME <=1 SENSITIVE Sensitive     CEFTRIAXONE <=1 SENSITIVE Sensitive     CIPROFLOXACIN <=0.25 SENSITIVE Sensitive     GENTAMICIN <=1 SENSITIVE Sensitive     IMIPENEM 1 SENSITIVE Sensitive     TRIMETH/SULFA <=20 SENSITIVE Sensitive     AMPICILLIN/SULBACTAM <=2 SENSITIVE Sensitive     PIP/TAZO <=4 SENSITIVE Sensitive     * PROTEUS MIRABILIS  Blood Culture ID Panel (Reflexed)     Status: Abnormal   Collection Time: 11/30/16 11:42 PM  Result Value Ref Range Status   Enterococcus species NOT DETECTED NOT DETECTED Final   Listeria monocytogenes NOT DETECTED NOT DETECTED Final   Staphylococcus species NOT DETECTED NOT DETECTED Final   Staphylococcus aureus NOT DETECTED NOT DETECTED Final   Streptococcus species NOT DETECTED NOT DETECTED Final   Streptococcus agalactiae NOT DETECTED NOT DETECTED Final   Streptococcus pneumoniae NOT DETECTED NOT DETECTED Final   Streptococcus pyogenes NOT DETECTED NOT DETECTED Final   Acinetobacter baumannii NOT DETECTED NOT DETECTED Final   Enterobacteriaceae species DETECTED (A) NOT DETECTED Final    Comment: Enterobacteriaceae represent a large family of gram-negative bacteria, not a single organism. CRITICAL RESULT CALLED TO, READ BACK BY AND VERIFIED WITH: [place] Slider PHARMD [id number] 12/01/16 A BROWNING    Enterobacter cloacae complex NOT DETECTED NOT DETECTED Final   Escherichia coli NOT DETECTED NOT DETECTED Final   Klebsiella oxytoca NOT DETECTED NOT DETECTED Final  Klebsiella pneumoniae NOT DETECTED NOT DETECTED Final   Proteus species DETECTED (A) NOT DETECTED Final      Comment: CRITICAL RESULT CALLED TO, READ BACK BY AND VERIFIED WITH: [place] Slider PHARMD [id number] 12/01/16 A BROWNING    Serratia marcescens NOT DETECTED NOT DETECTED Final   Carbapenem resistance NOT DETECTED NOT DETECTED Final   Haemophilus influenzae NOT DETECTED NOT DETECTED Final   Neisseria meningitidis NOT DETECTED NOT DETECTED Final   Pseudomonas aeruginosa NOT DETECTED NOT DETECTED Final   Candida albicans NOT DETECTED NOT DETECTED Final   Candida glabrata NOT DETECTED NOT DETECTED Final   Candida krusei NOT DETECTED NOT DETECTED Final   Candida parapsilosis NOT DETECTED NOT DETECTED Final   Candida tropicalis NOT DETECTED NOT DETECTED Final    Comment: Performed at [facility], [place] [address] [place], [place] [address]  Culture, blood (Routine x 2)     Status: Abnormal   Collection Time: 12/01/16 12:23 AM  Result Value Ref Range Status   Specimen Description BLOOD RIGHT HAND  Final   Special Requests IN PEDIATRIC BOTTLE Blood Culture adequate volume  Final   Culture  Setup Time   Final    GRAM NEGATIVE RODS IN PEDIATRIC BOTTLE CRITICAL RESULT CALLED TO, READ BACK BY AND VERIFIED WITH: [place] 12/01/16 A BROWNING    Culture (A)  Final    PROTEUS MIRABILIS SUSCEPTIBILITIES PERFORMED ON PREVIOUS CULTURE WITHIN THE LAST 5 DAYS. Performed at [facility], [place] [address] [place], [place] [address]    Report Status 12/03/2016 FINAL  Final  C difficile quick scan w PCR reflex     Status: None   Collection Time: 12/02/16  1:25 PM  Result Value Ref Range Status   C Diff antigen NEGATIVE NEGATIVE Final   C Diff toxin NEGATIVE NEGATIVE Final   C Diff interpretation No C. difficile detected.  Final         Radiology Studies: US Renal  Result Date: 12/02/2016 CLINICAL DATA:  Acute kidney injury EXAM: RENAL / URINARY TRACT ULTRASOUND COMPLETE COMPARISON:  None. FINDINGS: Right Kidney: Length: Atrophic, 8.7 cm. Lower pole stone measures 5 mm. Mild  right hydronephrosis. Normal echotexture. Left Kidney: Length: 9.3 cm. Normal echotexture. No hydronephrosis. Midpole cyst measures up to 4 cm. Bladder: Appears normal for degree of bladder distention. IMPRESSION: Mildly atrophic right kidney with 5 mm lower pole stone. Mild right hydronephrosis noted. Electronically Signed   By: Rolm Baptise M.D.   On: 12/02/2016 18:47   Dg Chest Port 1 View  Result Date: 12/02/2016 CLINICAL DATA:  Increased wheezing and shortness of breath. Tachypnea. History CHF. EXAM: PORTABLE CHEST 1 VIEW COMPARISON:  Frontal and lateral views October 10, 202018 FINDINGS: The heart is enlarged. There is developing vascular congestion. Minimal perihilar pulmonary edema suspected. No large pleural effusion, focal consolidation, or pneumothorax. Stable osseous structures. IMPRESSION: Cardiomegaly with developing vascular congestion and mild perihilar edema, consistent with mild CHF. Electronically Signed   By: Jeb Levering M.D.   On: 12/02/2016 06:54      Scheduled Meds: . aspirin EC  81 mg Oral Daily  . enoxaparin (LOVENOX) injection  30 mg Subcutaneous Q24H  . famotidine  20 mg Oral QHS  . ferrous sulfate  325 mg Oral Q breakfast  . guaiFENesin  600 mg Oral BID  . insulin aspart  0-15 Units Subcutaneous TID WC  . insulin aspart  0-5 Units Subcutaneous QHS  . insulin glargine  15 Units Subcutaneous Q2200  .  ipratropium-albuterol  3 mL Nebulization Q6H  . sertraline  100 mg Oral QPC breakfast  . sodium chloride flush  3 mL Intravenous Q12H   Continuous Infusions: . piperacillin-tazobactam (ZOSYN)  IV 3.375 g (12/03/16 1255)     LOS: 2 days    Time spent in minutes: 35    Debbe Odea, MD [facility] Pager: [email or web address] Password [email or web address] 12/03/2016, 3:00 PM

## 2016-12-03 NOTE — Progress Notes (Addendum)
CBG @ 1771 is 60.  Asymptomatic.  Juice given, breakfast tray here and pt eating.  Will recheck in 15 minutes.    Recheck 0815= 86

## 2016-12-03 NOTE — Progress Notes (Signed)
PHARMACY NOTE:  ANTIMICROBIAL RENAL DOSAGE ADJUSTMENT  Current antimicrobial regimen includes a mismatch between antimicrobial dosage and estimated renal function.  As per policy approved by the Pharmacy & Therapeutics and Medical Executive Committees, the antimicrobial dosage will be adjusted accordingly.  Current antimicrobial dosage:  Unasyn 3gm q6h  Indication: UTI  Renal Function:   Estimated Creatinine Clearance: 25.1 mL/min (A) (by C-G formula based on SCr of 1.8 mg/dL (H)). []      On intermittent HD, scheduled: []      On CRRT    Antimicrobial dosage has been changed to:  Unasyn 3gm q12   Additional Comments:  Will adjust as needed for renal function   Thank you for allowing pharmacy to be a part of this patient's care.  Minda Ditto  12/03/2016 3:16 PM

## 2016-12-04 LAB — GLUCOSE, CAPILLARY
GLUCOSE-CAPILLARY: 111 mg/dL — AB (ref 65–99)
Glucose-Capillary: 79 mg/dL (ref 65–99)
Glucose-Capillary: 242 mg/dL — ABNORMAL HIGH (ref 65–99)
Glucose-Capillary: 169 mg/dL — ABNORMAL HIGH (ref 65–99)

## 2016-12-04 LAB — CBC
HEMATOCRIT: 28.7 % — AB (ref 36.0–46.0)
Hemoglobin: 9.6 g/dL — ABNORMAL LOW (ref 12.0–15.0)
MCH: 31.2 pg (ref 26.0–34.0)
MCHC: 33.4 g/dL (ref 30.0–36.0)
MCV: 93.2 fL (ref 78.0–100.0)
Platelets: 111 10*3/uL — ABNORMAL LOW (ref 150–400)
RBC: 3.08 MIL/uL — AB (ref 3.87–5.11)
RDW: 13.6 % (ref 11.5–15.5)
WBC: 15.8 10*3/uL — ABNORMAL HIGH (ref 4.0–10.5)

## 2016-12-04 LAB — BASIC METABOLIC PANEL
ANION GAP: 12 (ref 5–15)
BUN: 47 mg/dL — ABNORMAL HIGH (ref 6–20)
CHLORIDE: 107 mmol/L (ref 101–111)
CO2: 20 mmol/L — AB (ref 22–32)
Calcium: 8.5 mg/dL — ABNORMAL LOW (ref 8.9–10.3)
Creatinine, Ser: 1.7 mg/dL — ABNORMAL HIGH (ref 0.44–1.00)
GFR calc non Af Amer: 27 mL/min — ABNORMAL LOW (ref >60)
GFR, EST AFRICAN AMERICAN: 31 mL/min — AB (ref >60)
Glucose, Bld: 83 mg/dL (ref 65–99)
POTASSIUM: 3.4 mmol/L — AB (ref 3.5–5.1)
Sodium: 139 mmol/L (ref 135–145)

## 2016-12-04 LAB — TSH: TSH: 3.805 u[IU]/mL (ref 0.350–4.500)

## 2016-12-04 LAB — BRAIN NATRIURETIC PEPTIDE: B Natriuretic Peptide: 911.6 pg/mL — ABNORMAL HIGH (ref 0.0–100.0)

## 2016-12-04 MED ORDER — POTASSIUM CHLORIDE CRYS ER 20 MEQ PO TBCR
40.0000 meq | EXTENDED_RELEASE_TABLET | Freq: Once | ORAL | Status: AC
  Administered 2016-12-04: 40 meq via ORAL
  Filled 2016-12-04: qty 2

## 2016-12-04 MED ORDER — CARVEDILOL 3.125 MG PO TABS
3.1250 mg | ORAL_TABLET | Freq: Two times a day (BID) | ORAL | Status: DC
  Administered 2016-12-04 – 2016-12-09 (×11): 3.125 mg via ORAL
  Filled 2016-12-04 (×11): qty 1

## 2016-12-04 MED ORDER — POTASSIUM CHLORIDE CRYS ER 20 MEQ PO TBCR
20.0000 meq | EXTENDED_RELEASE_TABLET | Freq: Once | ORAL | Status: AC
  Administered 2016-12-04: 20 meq via ORAL
  Filled 2016-12-04: qty 1

## 2016-12-04 MED ORDER — FUROSEMIDE 10 MG/ML IJ SOLN
40.0000 mg | Freq: Once | INTRAMUSCULAR | Status: AC
  Administered 2016-12-04: 40 mg via INTRAVENOUS
  Filled 2016-12-04: qty 4

## 2016-12-04 NOTE — Evaluation (Signed)
Physical Therapy Evaluation Patient Details Name: Carrie Atkins MRN: [id number] DOB: 21-Mar-1934 Today's Date: 12/04/2016   History of Present Illness  83 y.o. female with a past medical history significant for CLL, IDDM with neuropathy, HTN, and ESBL UTI and admitted with Severe Sepsis due to E coli UTI and Proteus bacteremia    Clinical Impression  Pt admitted with above diagnosis. Pt currently with functional limitations due to the deficits listed below (see PT Problem List).  Pt will benefit from skilled PT to increase their independence and safety with mobility to allow discharge to the venue listed below.  Pt assisted OOB to recliner.  Pt felt unable to tolerate ambulating today due to weakness.  Pt would benefit from SNF upon d/c prior to home.     Follow Up Recommendations SNF    Equipment Recommendations  None recommended by PT    Recommendations for Other Services       Precautions / Restrictions Precautions Precautions: Fall      Mobility  Bed Mobility Overal bed mobility: Needs Assistance Bed Mobility: Supine to Sit     Supine to sit: HOB elevated;Mod assist     General bed mobility comments: assist for trunk upright and scooting to EOB, utilized bed pad  Transfers Overall transfer level: Needs assistance Equipment used: Rolling walker (2 wheeled) Transfers: Sit to/from [facility] Sit to Stand: Min assist Stand pivot transfers: Min assist       General transfer comment: verbal cues for safe technique, assist to rise and control descent, pt only able to tolerate short steps over to recliner, reports weak, shaky legs.  Ambulation/Gait                Stairs            Wheelchair Mobility    Modified Rankin (Stroke Patients Only)       Balance Overall balance assessment: Needs assistance         Standing balance support: Bilateral upper extremity supported Standing balance-Leahy Scale: Zero Standing balance  comment: requires UE support and external assist with standing                             Pertinent Vitals/Pain Pain Assessment: No/denies pain  SpO2 at rest: 95% room air SpO2 during transfer: 100% room air SpO2 upon sitting in recliner: 96% room air    Home Living Family/patient expects to be discharged to:: Private residence Living Arrangements: Alone Available Help at Discharge: Family;Available PRN/intermittently Type of Home: Apartment Home Access: Ramped entrance     Home Layout: One level Home Equipment: Walker - 4 wheels;Grab bars - tub/shower;Grab bars - toilet;Toilet riser      Prior Function Level of Independence: Independent with assistive device(s)         Comments: uses rollator, daughter available intermittently     Hand Dominance        Extremity/Trunk Assessment   Upper Extremity Assessment Upper Extremity Assessment: Generalized weakness    Lower Extremity Assessment Lower Extremity Assessment: Generalized weakness       Communication   Communication: No difficulties  Cognition Arousal/Alertness: Awake/alert Behavior During Therapy: WFL for tasks assessed/performed Overall Cognitive Status: Within Functional Limits for tasks assessed  General Comments      Exercises     Assessment/Plan    PT Assessment Patient needs continued PT services  PT Problem List Decreased strength;Decreased mobility;Decreased balance;Decreased knowledge of use of DME;Decreased activity tolerance       PT Treatment Interventions DME instruction;Gait training;Therapeutic exercise;Therapeutic activities;Functional mobility training;Patient/family education    PT Goals (Current goals can be found in the Care Plan section)  Acute Rehab PT Goals PT Goal Formulation: With patient Time For Goal Achievement: 12/18/16 Potential to Achieve Goals: Good    Frequency Min 3X/week   Barriers to  discharge        Co-evaluation               AM-PAC PT "6 Clicks" Daily Activity  Outcome Measure Difficulty turning over in bed (including adjusting bedclothes, sheets and blankets)?: Total Difficulty moving from lying on back to sitting on the side of the bed? : Total Difficulty sitting down on and standing up from a chair with arms (e.g., wheelchair, bedside commode, etc,.)?: Total Help needed moving to and from a bed to chair (including a wheelchair)?: A Little Help needed walking in hospital room?: A Lot Help needed climbing 3-5 steps with a railing? : A Lot 6 Click Score: 10    End of Session Equipment Utilized During Treatment: Gait belt Activity Tolerance: Patient tolerated treatment well Patient left: in chair;with call bell/phone within reach;with chair alarm set Nurse Communication: Mobility status PT Visit Diagnosis: Other abnormalities of gait and mobility (R26.89)    Time: 2229-7989 PT Time Calculation (min) (ACUTE ONLY): 15 min   Charges:   PT Evaluation $PT Eval Low Complexity: 1 Procedure     PT G CodesCarmelia Bake, PT, DPT 12/04/2016 Pager: [phone]   York Ram E 12/04/2016, 12:13 PM

## 2016-12-04 NOTE — Progress Notes (Signed)
CSW to follow for placement.

## 2016-12-04 NOTE — Clinical Social Work Note (Signed)
Clinical Social Work Assessment  Patient Details  Name: Carrie Atkins MRN: [id number] Date of Birth: 04-29-1934  Date of referral:  12/04/16               Reason for consult:  Facility Placement                Permission sought to share information with:  Family Supports Permission granted to share information::  Yes, Verbal Permission Granted  Name::     Daughter Melynda Keller [phone]  Agency::     Relationship::     Contact Information:     Housing/Transportation Living arrangements for the past 2 months:  Apartment Source of Information:  Patient, Adult Children, Facility Patient Interpreter Needed:  None Criminal Activity/Legal Involvement Pertinent to Current Situation/Hospitalization:  No - Comment as needed Significant Relationships:  Adult Children Lives with:  Self, Adult Children (Lives in apartment attached to daughter's home) Do you feel safe going back to the place where you live?  Yes Need for family participation in patient care:  No (Coment)  Care giving concerns:  Pt from home where she lives in an apartment attached to her daughter's home. She ambulated using walker and daughter assists her with transfers, meals, etc. Received SNF rehab in 09/2016 and states her ambulation improved.  Daughter and pt express hope that pt will benefit from [facility] rehab again in order to help her return home closer to prior level of functioning.    Social Worker assessment / plan:  CSW consulted for potential SNF placement. Met with pt at bedside and daughter via phone. Both agreeable to [facility] rehab, familiar with process as pt went to [facility] in April of this year. Will be in [facility] co-pay days upon returning to SNF, daughter aware.  Completed FL2 and made referrals to area facilities.   Plan: SNF at [place], will follow up for bed offers.  Employment status:  Retired Nurse, adult PT Recommendations:  [place] / Referral to community  resources:  [place]  Patient/Family's Response to care:  Appreciative of care. Pt not very responsive to CSW but throughout assessment became more interactive.   Patient/Family's Understanding of and Emotional Response to Diagnosis, Current Treatment, and Prognosis:  Appropriate emotional response, engaged and hopeful. Daughter and pt express adequate understanding of plan and potential barriers.  Emotional Assessment Appearance:  Appears stated age Attitude/Demeanor/Rapport:   (unremarkable, appropriate) Affect (typically observed):  Accepting, Calm Orientation:  Oriented to Self, Oriented to Place, Oriented to  Time, Oriented to Situation Alcohol / Substance use:  Not Applicable Psych involvement (Current and /or in the community):  No (Comment)  Discharge Needs  Concerns to be addressed:  Discharge Planning Concerns Readmission within the last 30 days:  No Current discharge risk:  Dependent with Mobility Barriers to Discharge:  Continued Medical Work up   [employer], LCSW 12/04/2016, 11:56 AM  [phone]

## 2016-12-04 NOTE — Progress Notes (Addendum)
PROGRESS NOTE    Carrie Atkins   OFB:[id number]  DOB: 04/02/1934  DOA: 11/30/2016 PCP: [name], MD   Brief Narrative:   Carrie Atkins a 83 y.o.femalewith a past medical history significant for CLL, IDDM, HTN, and ESBL UTIwho presents with confusion for 1 day, dysuria and malaise. She was found to have a UTI and admitted to the hospital for further management. She has since grown eEColi in her urine and Proteus in her blood.   Subjective:  Patient in bed, appears comfortable, denies any headache, no fever, no chest pain or pressure, no shortness of breath , no abdominal pain. No focal weakness.  Assessment & Plan:  Severe Sepsis due to E coli UTI and Proteus bacteremia  fever 101.1, RR elevated, WBC 26.5, Lactic acid 3.55 - culture and sensitivity data noted will change to [place], leukocytosis has improved and sepsis physiology has resolved- lactic acid normalized, continue present antibiotics, remains quite weak and will require SNF placement.  Acute on chronic diastolic CHF EF 78% -  recent echo noted with EF of 60% with grade 1 chronic diastolic dysfunction, check BNP, IV Lasix, stop fluids and monitor. We'll also give supportive care with nebulizer treatments and oxygen as needed.  AKI - baseline is about 0.8- Cr 2.00 on admission,  renal ultrasound shows mild R hydronephrosis, lower pole renal stone (non obstructing) and mild atropy of R kidney, will check post void Bladder Scan and monitor renal function on Lasix, renal function seems to be improving.  HTN (hypertension) - resume Coreg, I do not think this is contributing to wheezing as she is still wheezing today without Coreg for over 24-48 hours.  Paroxysmal atrial fibrillation - Hold Coreg for today - not on anticoagulation at home due to fall risk, defer this to PCP and primary cardiologist. Continue aspirin. [place] vasc 2 score of at least 3.  CLL history of. Supportive care for now  Diarrhea antibiotic  induced  - was neg for C diff  Generalized weakness and deconditioning. PT eval, SNF, check TSH.   DM2- hypoglycemia due to poor oral intake - A1c 12.6 on 4/18 - Amaryl, Lantus and SSI at home, due to ARF Amaryl discontinued, Lantus has been can back on SSI will monitor.  CBG (last 3)   Recent Labs  12/03/16 1646 12/03/16 2106 12/04/16 0813  GLUCAP 90 109* 79    DVT prophylaxis: Lovenox  Code Status: DNR Family Communication:  Disposition Plan:  Has been weak at home-  Obtain PT eval for disposition Consultants:     Procedures:     Antimicrobials:  Anti-infectives    Start     Dose/Rate Route Frequency Ordered Stop   12/03/16 2000  Ampicillin-Sulbactam (UNASYN) 3 g in sodium chloride 0.9 % 100 mL IVPB     3 g 200 mL/hr over 30 Minutes Intravenous Every 12 hours 12/03/16 1504     12/01/16 0600  piperacillin-tazobactam (ZOSYN) IVPB 3.375 g  Status:  Discontinued     3.375 g 12.5 mL/hr over 240 Minutes Intravenous Every 8 hours 12/01/16 0501 12/03/16 1504   12/01/16 0015  piperacillin-tazobactam (ZOSYN) IVPB 3.375 g     3.375 g 100 mL/hr over 30 Minutes Intravenous  Once 12/01/16 0012 12/01/16 0126   12/01/16 0015  vancomycin (VANCOCIN) IVPB 1000 mg/200 mL premix     1,000 mg 200 mL/hr over 60 Minutes Intravenous  Once 12/01/16 0012 12/01/16 0241       Objective: Vitals:   12/03/16  2219 12/04/16 0715 12/04/16 0816 12/04/16 0913  BP:  138/71    Pulse:  (!) 55    Resp:  20    Temp:  98.5 F (36.9 C)    TempSrc:  Oral    SpO2: 99% 100%  100%  Weight:   88.8 kg (195 lb 12.3 oz)   Height:        Intake/Output Summary (Last 24 hours) at 12/04/16 1036 Last data filed at 12/03/16 2200  Gross per 24 hour  Intake              340 ml  Output                0 ml  Net              340 ml   Filed Weights   12/01/16 1049 12/03/16 0451 12/04/16 0816  Weight: 87.2 kg (192 lb 3.9 oz) 90.2 kg (198 lb 13.7 oz) 88.8 kg (195 lb 12.3 oz)    Examination:  Awake, mildly  confused , appears frail, No new F.N deficits, Normal affect [place].AT,PERRAL Supple Neck,No JVD, No cervical lymphadenopathy appriciated.  Symmetrical Chest wall movement, Good air movement bilaterally, CTAB RRR,No Gallops,Rubs or new Murmurs, No Parasternal Heave +ve B.Sounds, Abd Soft, No tenderness, No organomegaly appriciated, No rebound - guarding or rigidity. No Cyanosis, Clubbing or edema, No new Rash or bruise     Data Reviewed: I have personally reviewed following labs and imaging studies  CBC:  Recent Labs Lab 11/30/16 2342 12/01/16 0515 12/02/16 0710 12/03/16 0547 12/04/16 0507  WBC 26.5* 23.1* 20.5* 16.3* 15.8*  NEUTROABS 23.8*  --   --   --   --   HGB 11.4* 10.8* 10.0* 9.4* 9.6*  HCT 34.6* 32.9* 30.5* 28.8* 28.7*  MCV 92.0 92.4 93.6 91.4 93.2  PLT 142* 127* 100* 106* 354*   Basic Metabolic Panel:  Recent Labs Lab 11/30/16 2342 12/01/16 0515 12/02/16 0710 12/03/16 0547 12/04/16 0507  NA 134* 137 139 136 139  K 4.2 4.2 3.5 3.6 3.4*  CL 99* 105 108 107 107  CO2 23 22 22  20* 20*  GLUCOSE 282* 233* 58* 65 83  BUN 40* 39* 47* 39* 47*  CREATININE 2.00* 2.02* 2.16* 1.80* 1.70*  CALCIUM 9.0 8.0* 8.1* 8.2* 8.5*   GFR: Estimated Creatinine Clearance: 26.3 mL/min (A) (by C-G formula based on SCr of 1.7 mg/dL (H)). Liver Function Tests:  Recent Labs Lab 11/30/16 2342  AST 25  ALT 20  ALKPHOS 68  BILITOT 0.7  PROT 6.9  ALBUMIN 3.6   No results for input(s): LIPASE, AMYLASE in the last 168 hours. No results for input(s): AMMONIA in the last 168 hours. Coagulation Profile:  Recent Labs Lab 11/30/16 2342  INR 1.07   Cardiac Enzymes: No results for input(s): CKTOTAL, CKMB, CKMBINDEX, TROPONINI in the last 168 hours. BNP (last 3 results) No results for input(s): PROBNP in the last 8760 hours. HbA1C: No results for input(s): HGBA1C in the last 72 hours. CBG:  Recent Labs Lab 12/03/16 0814 12/03/16 1153 12/03/16 1646 12/03/16 2106 12/04/16 0813   GLUCAP 86 131* 90 109* 79   Lipid Profile: No results for input(s): CHOL, HDL, LDLCALC, TRIG, CHOLHDL, LDLDIRECT in the last 72 hours. Thyroid Function Tests: No results for input(s): TSH, T4TOTAL, FREET4, T3FREE, THYROIDAB in the last 72 hours. Anemia Panel: No results for input(s): VITAMINB12, FOLATE, FERRITIN, TIBC, IRON, RETICCTPCT in the last 72 hours. Urine analysis:    Component  Value Date/Time   COLORURINE YELLOW 11/30/2016 2333   APPEARANCEUR HAZY (A) 11/30/2016 2333   LABSPEC 1.020 11/30/2016 2333   PHURINE 5.0 11/30/2016 2333   GLUCOSEU NEGATIVE 11/30/2016 2333   HGBUR MODERATE (A) 11/30/2016 2333   BILIRUBINUR NEGATIVE 11/30/2016 2333   KETONESUR NEGATIVE 11/30/2016 2333   PROTEINUR NEGATIVE 11/30/2016 2333   NITRITE NEGATIVE 11/30/2016 2333   LEUKOCYTESUR LARGE (A) 11/30/2016 2333   Sepsis Labs: @LABRCNTIP (procalcitonin:4,lacticidven:4) ) Recent Results (from the past 240 hour(s))  Urine culture     Status: Abnormal   Collection Time: 11/30/16 11:33 PM  Result Value Ref Range Status   Specimen Description URINE, CATHETERIZED  Final   Special Requests NONE  Final   Culture (A)  Final    >=100,000 COLONIES/mL ESCHERICHIA COLI Confirmed Extended Spectrum Beta-Lactamase Producer (ESBL) Performed at [facility], [place] [address] [place], [place] [address]    Report Status 12/03/2016 FINAL  Final   Organism ID, Bacteria ESCHERICHIA COLI (A)  Final      Susceptibility   Escherichia coli - MIC*    AMPICILLIN >=32 RESISTANT Resistant     CEFAZOLIN >=64 RESISTANT Resistant     CEFTRIAXONE >=64 RESISTANT Resistant     CIPROFLOXACIN >=4 RESISTANT Resistant     GENTAMICIN >=16 RESISTANT Resistant     IMIPENEM <=0.25 SENSITIVE Sensitive     NITROFURANTOIN <=16 SENSITIVE Sensitive     TRIMETH/SULFA >=320 RESISTANT Resistant     AMPICILLIN/SULBACTAM 8 SENSITIVE Sensitive     PIP/TAZO <=4 SENSITIVE Sensitive     Extended ESBL POSITIVE Resistant     * >=100,000  COLONIES/mL ESCHERICHIA COLI  Culture, blood (Routine x 2)     Status: Abnormal   Collection Time: 11/30/16 11:42 PM  Result Value Ref Range Status   Specimen Description BLOOD LEFT WRIST  Final   Special Requests   Final    BOTTLES DRAWN AEROBIC AND ANAEROBIC Blood Culture adequate volume   Culture  Setup Time   Final    GRAM NEGATIVE RODS IN BOTH AEROBIC AND ANAEROBIC BOTTLES CRITICAL RESULT CALLED TO, READ BACK BY AND VERIFIED WITH: [place] 12/01/16 A BROWNING Performed at [facility], [address] [place], [place] [address]    Culture PROTEUS MIRABILIS (A)  Final   Report Status 12/03/2016 FINAL  Final   Organism ID, Bacteria PROTEUS MIRABILIS  Final      Susceptibility   Proteus mirabilis - MIC*    AMPICILLIN <=2 SENSITIVE Sensitive     CEFAZOLIN <=4 SENSITIVE Sensitive     CEFEPIME <=1 SENSITIVE Sensitive     CEFTAZIDIME <=1 SENSITIVE Sensitive     CEFTRIAXONE <=1 SENSITIVE Sensitive     CIPROFLOXACIN <=0.25 SENSITIVE Sensitive     GENTAMICIN <=1 SENSITIVE Sensitive     IMIPENEM 1 SENSITIVE Sensitive     TRIMETH/SULFA <=20 SENSITIVE Sensitive     AMPICILLIN/SULBACTAM <=2 SENSITIVE Sensitive     PIP/TAZO <=4 SENSITIVE Sensitive     * PROTEUS MIRABILIS  Blood Culture ID Panel (Reflexed)     Status: Abnormal   Collection Time: 11/30/16 11:42 PM  Result Value Ref Range Status   Enterococcus species NOT DETECTED NOT DETECTED Final   Listeria monocytogenes NOT DETECTED NOT DETECTED Final   Staphylococcus species NOT DETECTED NOT DETECTED Final   Staphylococcus aureus NOT DETECTED NOT DETECTED Final   Streptococcus species NOT DETECTED NOT DETECTED Final   Streptococcus agalactiae NOT DETECTED NOT DETECTED Final   Streptococcus pneumoniae  NOT DETECTED NOT DETECTED Final   Streptococcus pyogenes NOT DETECTED NOT DETECTED Final   Acinetobacter baumannii NOT DETECTED NOT DETECTED Final   Enterobacteriaceae species DETECTED (A) NOT DETECTED Final     Comment: Enterobacteriaceae represent a large family of gram-negative bacteria, not a single organism. CRITICAL RESULT CALLED TO, READ BACK BY AND VERIFIED WITH: [place] Slider PHARMD [id number] 12/01/16 A BROWNING    Enterobacter cloacae complex NOT DETECTED NOT DETECTED Final   Escherichia coli NOT DETECTED NOT DETECTED Final   Klebsiella oxytoca NOT DETECTED NOT DETECTED Final   Klebsiella pneumoniae NOT DETECTED NOT DETECTED Final   Proteus species DETECTED (A) NOT DETECTED Final    Comment: CRITICAL RESULT CALLED TO, READ BACK BY AND VERIFIED WITH: [place] Slider PHARMD [id number] 12/01/16 A BROWNING    Serratia marcescens NOT DETECTED NOT DETECTED Final   Carbapenem resistance NOT DETECTED NOT DETECTED Final   Haemophilus influenzae NOT DETECTED NOT DETECTED Final   Neisseria meningitidis NOT DETECTED NOT DETECTED Final   Pseudomonas aeruginosa NOT DETECTED NOT DETECTED Final   Candida albicans NOT DETECTED NOT DETECTED Final   Candida glabrata NOT DETECTED NOT DETECTED Final   Candida krusei NOT DETECTED NOT DETECTED Final   Candida parapsilosis NOT DETECTED NOT DETECTED Final   Candida tropicalis NOT DETECTED NOT DETECTED Final    Comment: Performed at [facility], [place] [address] [place], [place] [address]  Culture, blood (Routine x 2)     Status: Abnormal   Collection Time: 12/01/16 12:23 AM  Result Value Ref Range Status   Specimen Description BLOOD RIGHT HAND  Final   Special Requests IN PEDIATRIC BOTTLE Blood Culture adequate volume  Final   Culture  Setup Time   Final    GRAM NEGATIVE RODS IN PEDIATRIC BOTTLE CRITICAL RESULT CALLED TO, READ BACK BY AND VERIFIED WITH: [place] 12/01/16 A BROWNING    Culture (A)  Final    PROTEUS MIRABILIS SUSCEPTIBILITIES PERFORMED ON PREVIOUS CULTURE WITHIN THE LAST 5 DAYS. Performed at [facility], [place] [address] [place], [place] [address]    Report Status 12/03/2016 FINAL  Final  Culture, blood (routine x 2)     Status:  None (Preliminary result)   Collection Time: 12/02/16 11:18 AM  Result Value Ref Range Status   Specimen Description BLOOD RIGHT ANTECUBITAL  Final   Special Requests IN PEDIATRIC BOTTLE Blood Culture adequate volume  Final   Culture   Final    NO GROWTH 1 DAY Performed at [facility], [place] [address], [place], [place] [address]    Report Status PENDING  Incomplete  Culture, blood (routine x 2)     Status: None (Preliminary result)   Collection Time: 12/02/16 11:23 AM  Result Value Ref Range Status   Specimen Description BLOOD LEFT HAND  Final   Special Requests IN PEDIATRIC BOTTLE Blood Culture adequate volume  Final   Culture   Final    NO GROWTH 1 DAY Performed at [facility], [place] [address], [place], [place] [address]    Report Status PENDING  Incomplete  C difficile quick scan w PCR reflex     Status: None   Collection Time: 12/02/16  1:25 PM  Result Value Ref Range Status   C Diff antigen NEGATIVE NEGATIVE Final   C Diff toxin NEGATIVE NEGATIVE Final   C Diff interpretation No C. difficile detected.  Final     Radiology Studies: US Renal  Result  Date: 12/02/2016 CLINICAL DATA:  Acute kidney injury EXAM: RENAL / URINARY TRACT ULTRASOUND COMPLETE COMPARISON:  None. FINDINGS: Right Kidney: Length: Atrophic, 8.7 cm. Lower pole stone measures 5 mm. Mild right hydronephrosis. Normal echotexture. Left Kidney: Length: 9.3 cm. Normal echotexture. No hydronephrosis. Midpole cyst measures up to 4 cm. Bladder: Appears normal for degree of bladder distention. IMPRESSION: Mildly atrophic right kidney with 5 mm lower pole stone. Mild right hydronephrosis noted. Electronically Signed   By: Rolm Baptise M.D.   On: 12/02/2016 18:47    Scheduled Meds: . aspirin EC  81 mg Oral Daily  . enoxaparin (LOVENOX) injection  30 mg Subcutaneous Q24H  . famotidine  20 mg Oral QHS  . ferrous sulfate  325 mg Oral Q breakfast  . guaiFENesin  600 mg Oral BID  . insulin aspart  0-15 Units  Subcutaneous TID WC  . insulin aspart  0-5 Units Subcutaneous QHS  . insulin glargine  15 Units Subcutaneous Q2200  . ipratropium-albuterol  3 mL Nebulization Q6H  . sertraline  100 mg Oral QPC breakfast   Continuous Infusions: . ampicillin-sulbactam (UNASYN) IV 3 g (12/04/16 0925)     LOS: 3 days    Time spent in minutes: 35  Signature  Lala Lund M.D on 12/04/2016 at 10:36 AM  Between 7am to 7pm - Pager - [phone] ( page via [email or web address], text pages only, please mention full 10 digit call back number).  After 7pm go to [email or web address] - password [facility]

## 2016-12-04 NOTE — NC FL2 (Signed)
[place] LEVEL OF CARE SCREENING TOOL     IDENTIFICATION  Patient Name: Carrie Atkins Birthdate: [birth year] Sex: female Admission Date (Current Location): 11/30/2016  [facility] and [place] Number:  Herbalist and Address:  [facility],  [place] [address] [place]      Provider Number: [id number]  Attending Physician Name and Address:  Thurnell Lose, MD  Relative Name and Phone Number:       Current Level of Care: Hospital Recommended Level of Care: [place] Prior Approval Number:    Date Approved/Denied:   PASRR Number: [id number]  Discharge Plan: SNF    Current Diagnoses: Patient Active Problem List   Diagnosis Date Noted  . History of CHF (congestive heart failure)   . Lactic acid acidosis   . Tachypnea   . Wheezing   . Acute lower UTI   . Bacteremia   . Severe sepsis with septic shock ([place]) 09/22/2016  . ESBL (extended spectrum beta-lactamase) producing bacteria infection 09/22/2016  . Hypotension   . Paroxysmal atrial fibrillation ([place]) 09/17/2016  . Urinary tract infection without hematuria   . Leukocytosis   . AKI (acute kidney injury) ([place])   . Acute cystitis without hematuria   . Sepsis ([place])   . Hyperglycemia   . Acute encephalopathy 12/31/2015  . DKA (diabetic ketoacidoses) ([place]) 12/31/2015  . Seizure ([place])   . Spinal stenosis, lumbar region, with neurogenic claudication 01/30/2015  . Cervical myelopathy ([place]) 01/30/2015  . Gait difficulty 10/24/2014  . Hyperreflexia 10/24/2014  . Weakness of both lower extremities 10/24/2014  . Diabetic polyneuropathy associated with type 2 diabetes mellitus ([place]) 10/24/2014  . SOB (shortness of breath) 07/14/2013  . CLL (chronic lymphocytic leukemia) ([place]) 06/05/2013  . HTN (hypertension) 02/03/2011  . Hyperlipidemia 02/03/2011    Orientation RESPIRATION BLADDER Height & Weight     Self, Time, Situation, Place  O2 (2L) Incontinent Weight:  195 lb 12.3 oz (88.8 kg) Height:  5\' 1"  (154.9 cm)  BEHAVIORAL SYMPTOMS/MOOD NEUROLOGICAL BOWEL NUTRITION STATUS      Incontinent Diet (heart healthy, carb modified)  AMBULATORY STATUS COMMUNICATION OF NEEDS Skin   Extensive Assist Verbally Normal                       Personal Care Assistance Level of Assistance  Bathing, Feeding, Dressing Bathing Assistance: Limited assistance Feeding assistance: Independent Dressing Assistance: Limited assistance     Functional Limitations Info  Sight, Hearing, Speech Sight Info: Adequate Hearing Info: Adequate Speech Info: Adequate    SPECIAL CARE FACTORS FREQUENCY  PT (By licensed PT), OT (By licensed OT)     PT Frequency: 5x OT Frequency: 5x            Contractures Contractures Info: Not present    Additional Factors Info  Code Status, Allergies, Isolation Precautions Code Status Info: DNR Allergies Info: Sulfonamide Derivatives, Hydrocodone, Sulfa Antibiotics     Isolation Precautions Info: Contact precautions, ESBL     Current Medications (12/04/2016):  This is the current hospital active medication list Current Facility-Administered Medications  Medication Dose Route Frequency Provider Last Rate Last Dose  . acetaminophen (TYLENOL) tablet 650 mg  650 mg Oral Q6H PRN Edwin Dada, MD   650 mg at 12/04/16 0925  . albuterol (PROVENTIL) (2.5 MG/3ML) 0.083% nebulizer solution 2.5 mg  2.5 mg Nebulization Q4H PRN Gardiner Barefoot, NP   2.5 mg at 12/03/16 1030  . Ampicillin-Sulbactam (UNASYN)  3 g in sodium chloride 0.9 % 100 mL IVPB  3 g Intravenous Q12H Debbe Odea, MD   Stopped at 12/04/16 1030  . aspirin EC tablet 81 mg  81 mg Oral Daily Edwin Dada, MD   81 mg at 12/04/16 2956  . carvedilol (COREG) tablet 3.125 mg  3.125 mg Oral BID WC Lala Lund K, MD      . enoxaparin (LOVENOX) injection 30 mg  30 mg Subcutaneous Q24H Danford, Suann Larry, MD   30 mg at 12/04/16 0925  . famotidine  (PEPCID) tablet 20 mg  20 mg Oral QHS Edwin Dada, MD   20 mg at 12/03/16 2102  . ferrous sulfate tablet 325 mg  325 mg Oral Q breakfast Edwin Dada, MD   325 mg at 12/04/16 0925  . guaiFENesin (MUCINEX) 12 hr tablet 600 mg  600 mg Oral BID Debbe Odea, MD   600 mg at 12/04/16 0925  . insulin aspart (novoLOG) injection 0-15 Units  0-15 Units Subcutaneous TID WC Danford, Suann Larry, MD   2 Units at 12/03/16 1255  . insulin aspart (novoLOG) injection 0-5 Units  0-5 Units Subcutaneous QHS Danford, Christopher P, MD      . insulin glargine (LANTUS) injection 15 Units  15 Units Subcutaneous Q2200 Debbe Odea, MD   15 Units at 12/03/16 2113  . ipratropium-albuterol (DUONEB) 0.5-2.5 (3) MG/3ML nebulizer solution 3 mL  3 mL Nebulization Q6H Rizwan, Saima, MD   3 mL at 12/04/16 0913  . ondansetron (ZOFRAN) tablet 4 mg  4 mg Oral Q6H PRN Danford, Suann Larry, MD      . potassium chloride SA (K-DUR,KLOR-CON) CR tablet 20 mEq  20 mEq Oral Once Thurnell Lose, MD      . sertraline (ZOLOFT) tablet 100 mg  100 mg Oral QPC breakfast Edwin Dada, MD   100 mg at 12/04/16 2130     Discharge Medications: Please see discharge summary for a list of discharge medications.  Relevant Imaging Results:  Relevant Lab Results:   Additional Information SSN [id number]  Nila Nephew, LCSW

## 2016-12-04 NOTE — Progress Notes (Signed)
Pt bladder scanned after voiding and inct episode>>>146cc noted. SRP, RN

## 2016-12-05 ENCOUNTER — Inpatient Hospital Stay (HOSPITAL_COMMUNITY): Payer: Medicare Other

## 2016-12-05 LAB — BASIC METABOLIC PANEL
Anion gap: 10 (ref 5–15)
BUN: 43 mg/dL — ABNORMAL HIGH (ref 6–20)
CHLORIDE: 107 mmol/L (ref 101–111)
CO2: 20 mmol/L — ABNORMAL LOW (ref 22–32)
CREATININE: 1.49 mg/dL — AB (ref 0.44–1.00)
Calcium: 8.8 mg/dL — ABNORMAL LOW (ref 8.9–10.3)
GFR calc Af Amer: 37 mL/min — ABNORMAL LOW (ref >60)
GFR calc non Af Amer: 32 mL/min — ABNORMAL LOW (ref >60)
GLUCOSE: 90 mg/dL (ref 65–99)
POTASSIUM: 4.2 mmol/L (ref 3.5–5.1)
SODIUM: 137 mmol/L (ref 135–145)

## 2016-12-05 LAB — GLUCOSE, CAPILLARY
GLUCOSE-CAPILLARY: 139 mg/dL — AB (ref 65–99)
GLUCOSE-CAPILLARY: 146 mg/dL — AB (ref 65–99)
Glucose-Capillary: 87 mg/dL (ref 65–99)
Glucose-Capillary: 254 mg/dL — ABNORMAL HIGH (ref 65–99)

## 2016-12-05 LAB — CBC
HEMATOCRIT: 28.6 % — AB (ref 36.0–46.0)
Hemoglobin: 9.7 g/dL — ABNORMAL LOW (ref 12.0–15.0)
MCH: 31.8 pg (ref 26.0–34.0)
MCHC: 33.9 g/dL (ref 30.0–36.0)
MCV: 93.8 fL (ref 78.0–100.0)
PLATELETS: 145 10*3/uL — AB (ref 150–400)
RBC: 3.05 MIL/uL — ABNORMAL LOW (ref 3.87–5.11)
RDW: 13.6 % (ref 11.5–15.5)
WBC: 15.9 10*3/uL — AB (ref 4.0–10.5)

## 2016-12-05 MED ORDER — IPRATROPIUM-ALBUTEROL 0.5-2.5 (3) MG/3ML IN SOLN
3.0000 mL | Freq: Two times a day (BID) | RESPIRATORY_TRACT | Status: DC
  Administered 2016-12-06 – 2016-12-09 (×6): 3 mL via RESPIRATORY_TRACT
  Filled 2016-12-05 (×6): qty 3

## 2016-12-05 MED ORDER — FUROSEMIDE 10 MG/ML IJ SOLN
60.0000 mg | Freq: Once | INTRAMUSCULAR | Status: AC
  Administered 2016-12-05: 60 mg via INTRAVENOUS
  Filled 2016-12-05: qty 6

## 2016-12-05 MED ORDER — SODIUM CHLORIDE 0.9 % IV SOLN
1.0000 g | Freq: Two times a day (BID) | INTRAVENOUS | Status: DC
  Administered 2016-12-05 – 2016-12-08 (×7): 1 g via INTRAVENOUS
  Filled 2016-12-05 (×8): qty 1

## 2016-12-05 NOTE — Progress Notes (Addendum)
PROGRESS NOTE    Carrie Atkins   ZOX:[id number]  DOB: 10/25/1933  DOA: 11/30/2016 PCP: [name], MD   Brief Narrative:   Carrie Pilling Siegneris a 83 y.o.femalewith a past medical history significant for CLL, IDDM, HTN, and ESBL UTIwho presents with confusion for 1 day, dysuria and malaise. She was found to have a UTI and admitted to the hospital for further management. She has since grown eEColi in her urine and Proteus in her blood.   Subjective:  Patient in bed, appears comfortable, denies any headache, no fever, no chest pain or pressure, no shortness of breath , no abdominal pain. No focal weakness. Feels better today.   Assessment & Plan:  Severe Sepsis due to E coli UTI and bacteremia, Proteus ESBL UTI - was septic upon admission, was treated with empiric antibiotics, currently on meropenem for ESBL UTI, likely will give total of 3 days of IV antibiotic then changed to oral Vantin for a total of 10 days to cover for Escherichia coli. Note Proteus ESBL in urine only. Clinically much better sepsis physiology has resolved.  Mild nonspecific leukocytosis, no signs of infection, UA unremarkable, repeat 2 view chest x-ray as she is at risk for aspiration. Also some leukocytosis could be contributed by her history of CLL although her baseline appears to be close to 12.   Acute on chronic diastolic CHF EF 81% -  recent echo noted with EF of 60% and grade 1 diastolic dysfunction, BNP was extremely elevated and she had audible wheezes, after IV Lasix for diuresis she is better on 12/05/2016, continue IV Lasix, continue oxygen and nebulizer treatments as needed.   AKI - renal ultrasound nonacute, post void bladder scans are stable 2, renal function responding well to diuresis.  HTN (hypertension) - resume Coreg, I do not think this is contributing to wheezing as she is still wheezing today without Coreg for over 24-48 hours.  Paroxysmal atrial fibrillation - Hold Coreg for today -  not on anticoagulation at home due to fall risk, defer this to PCP and primary cardiologist. Continue aspirin. [place] vasc 2 score of at least 3.  CLL history of. Supportive care for now  Diarrhea antibiotic induced  - was neg for C diff and she diabetic induced.  Generalized weakness and deconditioning. TSH stable, will require SNF seen by PT.  DM2-  hypoglycemia due to poor oral intake - A1c 12.6 on 4/18  - Amaryl, Lantus and SSI at home, due to ARF Amaryl discontinued, Lantus has been can back on SSI will monitor.  CBG (last 3)   Recent Labs  12/04/16 1653 12/04/16 2156 12/05/16 0818  GLUCAP 242* 169* 87    DVT prophylaxis: Lovenox  Code Status: DNR Family Communication:  Disposition Plan:  SNF 1-2 days Consultants:     Procedures:     Antimicrobials:  Anti-infectives    Start     Dose/Rate Route Frequency Ordered Stop   12/05/16 0630  meropenem (MERREM) 1 g in sodium chloride 0.9 % 100 mL IVPB     1 g 200 mL/hr over 30 Minutes Intravenous Every 12 hours 12/05/16 0621     12/03/16 2000  Ampicillin-Sulbactam (UNASYN) 3 g in sodium chloride 0.9 % 100 mL IVPB  Status:  Discontinued     3 g 200 mL/hr over 30 Minutes Intravenous Every 12 hours 12/03/16 1504 12/05/16 0615   12/01/16 0600  piperacillin-tazobactam (ZOSYN) IVPB 3.375 g  Status:  Discontinued     3.375 g  12.5 mL/hr over 240 Minutes Intravenous Every 8 hours 12/01/16 0501 12/03/16 1504   12/01/16 0015  piperacillin-tazobactam (ZOSYN) IVPB 3.375 g     3.375 g 100 mL/hr over 30 Minutes Intravenous  Once 12/01/16 0012 12/01/16 0126   12/01/16 0015  vancomycin (VANCOCIN) IVPB 1000 mg/200 mL premix     1,000 mg 200 mL/hr over 60 Minutes Intravenous  Once 12/01/16 0012 12/01/16 0241       Objective: Vitals:   12/05/16 0300 12/05/16 0500 12/05/16 0604 12/05/16 1101  BP:   (!) 133/58   Pulse:   62   Resp:   19   Temp:   98 F (36.7 C)   TempSrc:   Oral   SpO2: 97%  97% 97%  Weight:  87.1 kg (192 lb 0.3 oz)     Height:        Intake/Output Summary (Last 24 hours) at 12/05/16 1123 Last data filed at 12/05/16 0643  Gross per 24 hour  Intake              340 ml  Output             1700 ml  Net            -1360 ml   Filed Weights   12/03/16 0451 12/04/16 0816 12/05/16 0500  Weight: 90.2 kg (198 lb 13.7 oz) 88.8 kg (195 lb 12.3 oz) 87.1 kg (192 lb 0.3 oz)    Examination:  Awake Alert, Oriented X 3, No new F.N deficits, Normal affect [place].AT,PERRAL Supple Neck,No JVD, No cervical lymphadenopathy appriciated.  Symmetrical Chest wall movement, Good air movement bilaterally, minimal bilateral wheezing RRR,No Gallops,Rubs or new Murmurs, No Parasternal Heave +ve B.Sounds, Abd Soft, No tenderness, No organomegaly appriciated, No rebound - guarding or rigidity. No Cyanosis, Clubbing or edema, No new Rash or bruise   Data Reviewed: I have personally reviewed following labs and imaging studies  CBC:  Recent Labs Lab 11/30/16 2342 12/01/16 0515 12/02/16 0710 12/03/16 0547 12/04/16 0507 12/05/16 0519  WBC 26.5* 23.1* 20.5* 16.3* 15.8* 15.9*  NEUTROABS 23.8*  --   --   --   --   --   HGB 11.4* 10.8* 10.0* 9.4* 9.6* 9.7*  HCT 34.6* 32.9* 30.5* 28.8* 28.7* 28.6*  MCV 92.0 92.4 93.6 91.4 93.2 93.8  PLT 142* 127* 100* 106* 111* 841*   Basic Metabolic Panel:  Recent Labs Lab 12/01/16 0515 12/02/16 0710 12/03/16 0547 12/04/16 0507 12/05/16 0519  NA 137 139 136 139 137  K 4.2 3.5 3.6 3.4* 4.2  CL 105 108 107 107 107  CO2 22 22 20* 20* 20*  GLUCOSE 233* 58* 65 83 90  BUN 39* 47* 39* 47* 43*  CREATININE 2.02* 2.16* 1.80* 1.70* 1.49*  CALCIUM 8.0* 8.1* 8.2* 8.5* 8.8*   GFR: Estimated Creatinine Clearance: 29.7 mL/min (A) (by C-G formula based on SCr of 1.49 mg/dL (H)). Liver Function Tests:  Recent Labs Lab 11/30/16 2342  AST 25  ALT 20  ALKPHOS 68  BILITOT 0.7  PROT 6.9  ALBUMIN 3.6   No results for input(s): LIPASE, AMYLASE in the last 168 hours. No results for  input(s): AMMONIA in the last 168 hours. Coagulation Profile:  Recent Labs Lab 11/30/16 2342  INR 1.07   Cardiac Enzymes: No results for input(s): CKTOTAL, CKMB, CKMBINDEX, TROPONINI in the last 168 hours. BNP (last 3 results) No results for input(s): PROBNP in the last 8760 hours. HbA1C: No results for input(s): HGBA1C  in the last 72 hours. CBG:  Recent Labs Lab 12/04/16 0813 12/04/16 1214 12/04/16 1653 12/04/16 2156 12/05/16 0818  GLUCAP 79 111* 242* 169* 87   Lipid Profile: No results for input(s): CHOL, HDL, LDLCALC, TRIG, CHOLHDL, LDLDIRECT in the last 72 hours. Thyroid Function Tests:  Recent Labs  12/04/16 1157  TSH 3.805   Anemia Panel: No results for input(s): VITAMINB12, FOLATE, FERRITIN, TIBC, IRON, RETICCTPCT in the last 72 hours. Urine analysis:    Component Value Date/Time   COLORURINE YELLOW 11/30/2016 2333   APPEARANCEUR HAZY (A) 11/30/2016 2333   LABSPEC 1.020 11/30/2016 2333   PHURINE 5.0 11/30/2016 2333   GLUCOSEU NEGATIVE 11/30/2016 2333   HGBUR MODERATE (A) 11/30/2016 2333   BILIRUBINUR NEGATIVE 11/30/2016 2333   KETONESUR NEGATIVE 11/30/2016 2333   PROTEINUR NEGATIVE 11/30/2016 2333   NITRITE NEGATIVE 11/30/2016 2333   LEUKOCYTESUR LARGE (A) 11/30/2016 2333   Sepsis Labs: @LABRCNTIP (procalcitonin:4,lacticidven:4) ) Recent Results (from the past 240 hour(s))  Urine culture     Status: Abnormal   Collection Time: 11/30/16 11:33 PM  Result Value Ref Range Status   Specimen Description URINE, CATHETERIZED  Final   Special Requests NONE  Final   Culture (A)  Final    >=100,000 COLONIES/mL ESCHERICHIA COLI Confirmed Extended Spectrum Beta-Lactamase Producer (ESBL) Performed at [facility], [place] [address], [place], [place] [address]    Report Status 12/03/2016 FINAL  Final   Organism ID, Bacteria ESCHERICHIA COLI (A)  Final      Susceptibility   Escherichia coli - MIC*    AMPICILLIN >=32 RESISTANT Resistant     CEFAZOLIN >=64  RESISTANT Resistant     CEFTRIAXONE >=64 RESISTANT Resistant     CIPROFLOXACIN >=4 RESISTANT Resistant     GENTAMICIN >=16 RESISTANT Resistant     IMIPENEM <=0.25 SENSITIVE Sensitive     NITROFURANTOIN <=16 SENSITIVE Sensitive     TRIMETH/SULFA >=320 RESISTANT Resistant     AMPICILLIN/SULBACTAM 8 SENSITIVE Sensitive     PIP/TAZO <=4 SENSITIVE Sensitive     Extended ESBL POSITIVE Resistant     * >=100,000 COLONIES/mL ESCHERICHIA COLI  Culture, blood (Routine x 2)     Status: Abnormal   Collection Time: 11/30/16 11:42 PM  Result Value Ref Range Status   Specimen Description BLOOD LEFT WRIST  Final   Special Requests   Final    BOTTLES DRAWN AEROBIC AND ANAEROBIC Blood Culture adequate volume   Culture  Setup Time   Final    GRAM NEGATIVE RODS IN BOTH AEROBIC AND ANAEROBIC BOTTLES CRITICAL RESULT CALLED TO, READ BACK BY AND VERIFIED WITH: [place] 12/01/16 A BROWNING Performed at [facility], [address] [place], [place] [address]    Culture PROTEUS MIRABILIS (A)  Final   Report Status 12/03/2016 FINAL  Final   Organism ID, Bacteria PROTEUS MIRABILIS  Final      Susceptibility   Proteus mirabilis - MIC*    AMPICILLIN <=2 SENSITIVE Sensitive     CEFAZOLIN <=4 SENSITIVE Sensitive     CEFEPIME <=1 SENSITIVE Sensitive     CEFTAZIDIME <=1 SENSITIVE Sensitive     CEFTRIAXONE <=1 SENSITIVE Sensitive     CIPROFLOXACIN <=0.25 SENSITIVE Sensitive     GENTAMICIN <=1 SENSITIVE Sensitive     IMIPENEM 1 SENSITIVE Sensitive     TRIMETH/SULFA <=20 SENSITIVE Sensitive     AMPICILLIN/SULBACTAM <=2 SENSITIVE Sensitive     PIP/TAZO <=4 SENSITIVE Sensitive     * PROTEUS MIRABILIS  Blood Culture ID Panel (Reflexed)     Status: Abnormal   Collection Time: 11/30/16 11:42 PM  Result Value Ref Range Status   Enterococcus species NOT DETECTED NOT DETECTED Final   Listeria monocytogenes NOT DETECTED NOT DETECTED Final   Staphylococcus species NOT DETECTED NOT DETECTED Final    Staphylococcus aureus NOT DETECTED NOT DETECTED Final   Streptococcus species NOT DETECTED NOT DETECTED Final   Streptococcus agalactiae NOT DETECTED NOT DETECTED Final   Streptococcus pneumoniae NOT DETECTED NOT DETECTED Final   Streptococcus pyogenes NOT DETECTED NOT DETECTED Final   Acinetobacter baumannii NOT DETECTED NOT DETECTED Final   Enterobacteriaceae species DETECTED (A) NOT DETECTED Final    Comment: Enterobacteriaceae represent a large family of gram-negative bacteria, not a single organism. CRITICAL RESULT CALLED TO, READ BACK BY AND VERIFIED WITH: [place] PHARMD [id number] 12/01/16 A BROWNING    Enterobacter cloacae complex NOT DETECTED NOT DETECTED Final   Escherichia coli NOT DETECTED NOT DETECTED Final   Klebsiella oxytoca NOT DETECTED NOT DETECTED Final   Klebsiella pneumoniae NOT DETECTED NOT DETECTED Final   Proteus species DETECTED (A) NOT DETECTED Final    Comment: CRITICAL RESULT CALLED TO, READ BACK BY AND VERIFIED WITH: [place] PHARMD [id number] 12/01/16 A BROWNING    Serratia marcescens NOT DETECTED NOT DETECTED Final   Carbapenem resistance NOT DETECTED NOT DETECTED Final   Haemophilus influenzae NOT DETECTED NOT DETECTED Final   Neisseria meningitidis NOT DETECTED NOT DETECTED Final   Pseudomonas aeruginosa NOT DETECTED NOT DETECTED Final   Candida albicans NOT DETECTED NOT DETECTED Final   Candida glabrata NOT DETECTED NOT DETECTED Final   Candida krusei NOT DETECTED NOT DETECTED Final   Candida parapsilosis NOT DETECTED NOT DETECTED Final   Candida tropicalis NOT DETECTED NOT DETECTED Final    Comment: Performed at [facility], [place] [address] [place], [place] [address]  Culture, blood (Routine x 2)     Status: Abnormal   Collection Time: 12/01/16 12:23 AM  Result Value Ref Range Status   Specimen Description BLOOD RIGHT HAND  Final   Special Requests IN PEDIATRIC BOTTLE Blood Culture adequate volume  Final   Culture  Setup Time   Final    GRAM  NEGATIVE RODS IN PEDIATRIC BOTTLE CRITICAL RESULT CALLED TO, READ BACK BY AND VERIFIED WITH: [place] 12/01/16 A BROWNING    Culture (A)  Final    PROTEUS MIRABILIS SUSCEPTIBILITIES PERFORMED ON PREVIOUS CULTURE WITHIN THE LAST 5 DAYS. Performed at [facility], [place] [address], [place], [place] [address]    Report Status 12/03/2016 FINAL  Final  Culture, blood (routine x 2)     Status: None (Preliminary result)   Collection Time: 12/02/16 11:18 AM  Result Value Ref Range Status   Specimen Description BLOOD RIGHT ANTECUBITAL  Final   Special Requests IN PEDIATRIC BOTTLE Blood Culture adequate volume  Final   Culture   Final    NO GROWTH 3 DAYS Performed at [facility], [place] [address], [place],  [address]    Report Status PENDING  Incomplete  Culture, blood (routine x 2)     Status: None (Preliminary result)   Collection Time: 12/02/16 11:23 AM  Result Value Ref Range Status   Specimen Description BLOOD LEFT HAND  Final   Special Requests IN PEDIATRIC BOTTLE Blood Culture adequate volume  Final   Culture   Final    NO GROWTH 3 DAYS Performed at [facility], [place] [address] [place], [place] [address]    Report Status PENDING  Incomplete  C difficile quick scan w PCR reflex     Status: None   Collection Time: 12/02/16  1:25 PM  Result Value Ref Range Status   C Diff antigen NEGATIVE NEGATIVE Final   C Diff toxin NEGATIVE NEGATIVE Final   C Diff interpretation No C. difficile detected.  Final     Radiology Studies: No results found.  Scheduled Meds: . aspirin EC  81 mg Oral Daily  . carvedilol  3.125 mg Oral BID WC  . enoxaparin (LOVENOX) injection  30 mg Subcutaneous Q24H  . famotidine  20 mg Oral QHS  . ferrous sulfate  325 mg Oral Q breakfast  . furosemide  60 mg Intravenous Once  . guaiFENesin  600 mg Oral BID  . insulin aspart  0-15 Units Subcutaneous TID WC  . insulin aspart  0-5 Units Subcutaneous QHS  . insulin glargine   15 Units Subcutaneous Q2200  . ipratropium-albuterol  3 mL Nebulization Q6H  . sertraline  100 mg Oral QPC breakfast   Continuous Infusions: . meropenem (MERREM) IV Stopped (12/05/16 0843)     LOS: 4 days    Time spent in minutes: 35  Signature  Lala Lund M.D on 12/05/2016 at 11:23 AM  Between 7am to 7pm - Pager - [phone] ( page via [email or web address], text pages only, please mention full 10 digit call back number).  After 7pm go to [email or web address] - password [email or web address]

## 2016-12-05 NOTE — Progress Notes (Signed)
Pharmacy Antibiotic Note  Carrie Atkins is a 83 y.o. female admitted on 11/30/2016 with ESBL infection.  Pharmacy has been consulted for Meropenem dosing.  Plan: Meropenem 1gm iv q12hr  Height: 5\' 1"  (154.9 cm) Weight: 192 lb 0.3 oz (87.1 kg) IBW/kg (Calculated) : 47.8  Temp (24hrs), Avg:98.5 F (36.9 C), Min:98 F (36.7 C), Max:99.5 F (37.5 C)   Recent Labs Lab 11/30/16 2355 12/01/16 0511 12/01/16 0515 12/02/16 0710 12/03/16 0547 12/04/16 0507 12/05/16 0519  WBC  --   --  23.1* 20.5* 16.3* 15.8* 15.9*  CREATININE  --   --  2.02* 2.16* 1.80* 1.70* 1.49*  LATICACIDVEN 3.55* 2.3*  --  1.0  --   --   --     Estimated Creatinine Clearance: 29.7 mL/min (A) (by C-G formula based on SCr of 1.49 mg/dL (H)).    Allergies  Allergen Reactions  . Sulfonamide Derivatives Nausea And Vomiting  . Hydrocodone Nausea And Vomiting  . Sulfa Antibiotics Nausea And Vomiting    Antimicrobials this admission: Vancomycin April 17, 202018 x1 Zosyn April 17, 202018 >> 6/21 621 Unasyn >> 6/23 6/23 meropenem >>   Dose adjustments this admission: -  Microbiology results: 6/20 BCx: sent 6/18 BCx: BCID=Proteus, pan-sensitive 6/18 UCx: >100k ESBL Ecoli  Thank you for allowing pharmacy to be a part of this patient's care.  Nani Skillern Crowford 12/05/2016 6:28 AM

## 2016-12-05 NOTE — Progress Notes (Signed)
Assumed care from previous RN. Agree with earlier assessment. Voicing no complaints at present time. Continue to monitor. Eulas Post, RN

## 2016-12-05 NOTE — Progress Notes (Signed)
Bladder scanned pt as ordered; pt has 134 cc of urine noted, after just voiding. SRP, RN

## 2016-12-06 LAB — GLUCOSE, CAPILLARY
GLUCOSE-CAPILLARY: 206 mg/dL — AB (ref 65–99)
Glucose-Capillary: 106 mg/dL — ABNORMAL HIGH (ref 65–99)
Glucose-Capillary: 186 mg/dL — ABNORMAL HIGH (ref 65–99)
Glucose-Capillary: 211 mg/dL — ABNORMAL HIGH (ref 65–99)

## 2016-12-06 LAB — BASIC METABOLIC PANEL
Anion gap: 11 (ref 5–15)
BUN: 44 mg/dL — ABNORMAL HIGH (ref 6–20)
CO2: 22 mmol/L (ref 22–32)
Calcium: 9 mg/dL (ref 8.9–10.3)
Chloride: 104 mmol/L (ref 101–111)
Creatinine, Ser: 1.45 mg/dL — ABNORMAL HIGH (ref 0.44–1.00)
GFR calc Af Amer: 38 mL/min — ABNORMAL LOW (ref >60)
GFR calc non Af Amer: 33 mL/min — ABNORMAL LOW (ref >60)
Glucose, Bld: 106 mg/dL — ABNORMAL HIGH (ref 65–99)
Potassium: 4.2 mmol/L (ref 3.5–5.1)
Sodium: 137 mmol/L (ref 135–145)

## 2016-12-06 LAB — CBC
HCT: 31 % — ABNORMAL LOW (ref 36.0–46.0)
Hemoglobin: 10.4 g/dL — ABNORMAL LOW (ref 12.0–15.0)
MCH: 31 pg (ref 26.0–34.0)
MCHC: 33.5 g/dL (ref 30.0–36.0)
MCV: 92.5 fL (ref 78.0–100.0)
Platelets: 151 10*3/uL (ref 150–400)
RBC: 3.35 MIL/uL — ABNORMAL LOW (ref 3.87–5.11)
RDW: 13.2 % (ref 11.5–15.5)
WBC: 16.1 10*3/uL — ABNORMAL HIGH (ref 4.0–10.5)

## 2016-12-06 MED ORDER — FUROSEMIDE 10 MG/ML IJ SOLN
40.0000 mg | Freq: Two times a day (BID) | INTRAMUSCULAR | Status: DC
  Administered 2016-12-06 – 2016-12-08 (×5): 40 mg via INTRAVENOUS
  Filled 2016-12-06 (×5): qty 4

## 2016-12-06 NOTE — Progress Notes (Signed)
Pt had uneventful day. OOB up in chair. Family was at bedside. Pt cont to have short term memory deficit. Reorients well. Cont with plan of care

## 2016-12-06 NOTE — Progress Notes (Signed)
PROGRESS NOTE    Carrie Atkins   CZY:[id number]  DOB: 02-Feb-1934  DOA: 11/30/2016 PCP: [name], MD   Brief Narrative:   Carrie Maahs Siegneris a 83 y.o.femalewith a past medical history significant for CLL, IDDM, HTN, and ESBL UTIwho presents with confusion for 1 day, dysuria and malaise. She was found to have a UTI and admitted to the hospital for further management. She has since grown eEColi in her urine and Proteus in her blood.   Subjective:  Patient in bed, appears comfortable, denies any headache, no fever, no chest pain or pressure, no shortness of breath , no abdominal pain. No focal weakness. Feels much better.  Assessment & Plan:  Severe Sepsis due to E coli UTI and bacteremia, Proteus ESBL UTI - was septic upon admission, was treated with empiric antibiotics, currently on meropenem for ESBL UTI, likely will give total of 3 days of IV antibiotic then changed to oral Vantin for a total of 10 days to cover for Proteus, Escherichia coli ESBL in urine only. She is clinically much better and sepsis physiology has resolved. Likely SNF soon .  Mild nonspecific leukocytosis could be related to underlying CLL, no signs of new infection, UTI as above and getting appropriately treated, chest x-ray remains clear, afebrile and nontoxic.  Acute on chronic diastolic CHF EF 09% -  recent echo noted with EF of 60% and grade 1 diastolic dysfunction, BNP was extremely elevated and she had audible wheezes on exam, responding extremely well to IV Lasix which will be continued, now off oxygen.   AKI - renal ultrasound nonacute, post void bladder scans are stable 2, renal function responding well to diuresis.  HTN (hypertension) - resume Coreg, I do not think this is contributing to wheezing as she is still wheezing today without Coreg for over 24-48 hours.  Paroxysmal atrial fibrillation - Hold Coreg for today - not on anticoagulation at home due to fall risk, defer this to PCP and  primary cardiologist. Continue aspirin. [place] vasc 2 score of at least 3.  CLL history of. Supportive care for now  Diarrhea antibiotic induced  - was neg for C diff and she diabetic induced.  Generalized weakness and deconditioning. TSH stable, will require SNF seen by PT.  DM2-  hypoglycemia due to poor oral intake - A1c 12.6 on 4/18  - Amaryl, Lantus and SSI at home, due to ARF Amaryl discontinued, Lantus has been can back on SSI will monitor.  CBG (last 3)   Recent Labs  12/05/16 1829 12/05/16 2325 12/06/16 0841  GLUCAP 139* 146* 106*    DVT prophylaxis: Lovenox  Code Status: DNR Family Communication:  Disposition Plan:  SNF 1-2 days Consultants:     Procedures:     Antimicrobials:  Anti-infectives    Start     Dose/Rate Route Frequency Ordered Stop   12/05/16 0630  meropenem (MERREM) 1 g in sodium chloride 0.9 % 100 mL IVPB     1 g 200 mL/hr over 30 Minutes Intravenous Every 12 hours 12/05/16 0621     12/03/16 2000  Ampicillin-Sulbactam (UNASYN) 3 g in sodium chloride 0.9 % 100 mL IVPB  Status:  Discontinued     3 g 200 mL/hr over 30 Minutes Intravenous Every 12 hours 12/03/16 1504 12/05/16 0615   12/01/16 0600  piperacillin-tazobactam (ZOSYN) IVPB 3.375 g  Status:  Discontinued     3.375 g 12.5 mL/hr over 240 Minutes Intravenous Every 8 hours 12/01/16 0501 12/03/16 1504  12/01/16 0015  piperacillin-tazobactam (ZOSYN) IVPB 3.375 g     3.375 g 100 mL/hr over 30 Minutes Intravenous  Once 12/01/16 0012 12/01/16 0126   12/01/16 0015  vancomycin (VANCOCIN) IVPB 1000 mg/200 mL premix     1,000 mg 200 mL/hr over 60 Minutes Intravenous  Once 12/01/16 0012 12/01/16 0241       Objective: Vitals:   12/05/16 2044 12/05/16 2137 12/06/16 0646 12/06/16 0842  BP:  139/62 138/62   Pulse:  68 65 78  Resp:  17 18   Temp:  98.4 F (36.9 C) 98.7 F (37.1 C)   TempSrc:  Oral Oral   SpO2: 98% 95% 96%   Weight:   85.5 kg (188 lb 7.9 oz)   Height:        Intake/Output  Summary (Last 24 hours) at 12/06/16 0937 Last data filed at 12/05/16 2230  Gross per 24 hour  Intake             1180 ml  Output              600 ml  Net              580 ml   Filed Weights   12/04/16 0816 12/05/16 0500 12/06/16 0646  Weight: 88.8 kg (195 lb 12.3 oz) 87.1 kg (192 lb 0.3 oz) 85.5 kg (188 lb 7.9 oz)    Examination:  Awake Alert, Oriented X 2, No new F.N deficits, Normal affect [place].AT,PERRAL Supple Neck,No JVD, No cervical lymphadenopathy appriciated.  Symmetrical Chest wall movement, Good air movement bilaterally, CTAB RRR,No Gallops,Rubs or new Murmurs, No Parasternal Heave +ve B.Sounds, Abd Soft, No tenderness, No organomegaly appriciated, No rebound - guarding or rigidity. No Cyanosis, Clubbing or edema, No new Rash or bruise    Data Reviewed: I have personally reviewed following labs and imaging studies  CBC:  Recent Labs Lab 11/30/16 2342  12/02/16 0710 12/03/16 0547 12/04/16 0507 12/05/16 0519 12/06/16 0526  WBC 26.5*  < > 20.5* 16.3* 15.8* 15.9* 16.1*  NEUTROABS 23.8*  --   --   --   --   --   --   HGB 11.4*  < > 10.0* 9.4* 9.6* 9.7* 10.4*  HCT 34.6*  < > 30.5* 28.8* 28.7* 28.6* 31.0*  MCV 92.0  < > 93.6 91.4 93.2 93.8 92.5  PLT 142*  < > 100* 106* 111* 145* 151  < > = values in this interval not displayed. Basic Metabolic Panel:  Recent Labs Lab 12/02/16 0710 12/03/16 0547 12/04/16 0507 12/05/16 0519 12/06/16 0526  NA 139 136 139 137 137  K 3.5 3.6 3.4* 4.2 4.2  CL 108 107 107 107 104  CO2 22 20* 20* 20* 22  GLUCOSE 58* 65 83 90 106*  BUN 47* 39* 47* 43* 44*  CREATININE 2.16* 1.80* 1.70* 1.49* 1.45*  CALCIUM 8.1* 8.2* 8.5* 8.8* 9.0   GFR: Estimated Creatinine Clearance: 30.2 mL/min (A) (by C-G formula based on SCr of 1.45 mg/dL (H)). Liver Function Tests:  Recent Labs Lab 11/30/16 2342  AST 25  ALT 20  ALKPHOS 68  BILITOT 0.7  PROT 6.9  ALBUMIN 3.6   No results for input(s): LIPASE, AMYLASE in the last 168 hours. No  results for input(s): AMMONIA in the last 168 hours. Coagulation Profile:  Recent Labs Lab 11/30/16 2342  INR 1.07   Cardiac Enzymes: No results for input(s): CKTOTAL, CKMB, CKMBINDEX, TROPONINI in the last 168 hours. BNP (last 3 results) No  results for input(s): PROBNP in the last 8760 hours. HbA1C: No results for input(s): HGBA1C in the last 72 hours. CBG:  Recent Labs Lab 12/05/16 0818 12/05/16 1145 12/05/16 1829 12/05/16 2325 12/06/16 0841  GLUCAP 87 254* 139* 146* 106*   Lipid Profile: No results for input(s): CHOL, HDL, LDLCALC, TRIG, CHOLHDL, LDLDIRECT in the last 72 hours. Thyroid Function Tests:  Recent Labs  12/04/16 1157  TSH 3.805   Anemia Panel: No results for input(s): VITAMINB12, FOLATE, FERRITIN, TIBC, IRON, RETICCTPCT in the last 72 hours. Urine analysis:    Component Value Date/Time   COLORURINE YELLOW 11/30/2016 2333   APPEARANCEUR HAZY (A) 11/30/2016 2333   LABSPEC 1.020 11/30/2016 2333   PHURINE 5.0 11/30/2016 2333   GLUCOSEU NEGATIVE 11/30/2016 2333   HGBUR MODERATE (A) 11/30/2016 2333   BILIRUBINUR NEGATIVE 11/30/2016 2333   KETONESUR NEGATIVE 11/30/2016 2333   PROTEINUR NEGATIVE 11/30/2016 2333   NITRITE NEGATIVE 11/30/2016 2333   LEUKOCYTESUR LARGE (A) 11/30/2016 2333   Sepsis Labs: @LABRCNTIP (procalcitonin:4,lacticidven:4) ) Recent Results (from the past 240 hour(s))  Urine culture     Status: Abnormal   Collection Time: 11/30/16 11:33 PM  Result Value Ref Range Status   Specimen Description URINE, CATHETERIZED  Final   Special Requests NONE  Final   Culture (A)  Final    >=100,000 COLONIES/mL ESCHERICHIA COLI Confirmed Extended Spectrum Beta-Lactamase Producer (ESBL) Performed at [facility], [place] [address] [place], [place] [address]    Report Status 12/03/2016 FINAL  Final   Organism ID, Bacteria ESCHERICHIA COLI (A)  Final      Susceptibility   Escherichia coli - MIC*    AMPICILLIN >=32 RESISTANT Resistant      CEFAZOLIN >=64 RESISTANT Resistant     CEFTRIAXONE >=64 RESISTANT Resistant     CIPROFLOXACIN >=4 RESISTANT Resistant     GENTAMICIN >=16 RESISTANT Resistant     IMIPENEM <=0.25 SENSITIVE Sensitive     NITROFURANTOIN <=16 SENSITIVE Sensitive     TRIMETH/SULFA >=320 RESISTANT Resistant     AMPICILLIN/SULBACTAM 8 SENSITIVE Sensitive     PIP/TAZO <=4 SENSITIVE Sensitive     Extended ESBL POSITIVE Resistant     * >=100,000 COLONIES/mL ESCHERICHIA COLI  Culture, blood (Routine x 2)     Status: Abnormal   Collection Time: 11/30/16 11:42 PM  Result Value Ref Range Status   Specimen Description BLOOD LEFT WRIST  Final   Special Requests   Final    BOTTLES DRAWN AEROBIC AND ANAEROBIC Blood Culture adequate volume   Culture  Setup Time   Final    GRAM NEGATIVE RODS IN BOTH AEROBIC AND ANAEROBIC BOTTLES CRITICAL RESULT CALLED TO, READ BACK BY AND VERIFIED WITH: [place] 12/01/16 A BROWNING Performed at [facility], [address] [place], [place] [address]    Culture PROTEUS MIRABILIS (A)  Final   Report Status 12/03/2016 FINAL  Final   Organism ID, Bacteria PROTEUS MIRABILIS  Final      Susceptibility   Proteus mirabilis - MIC*    AMPICILLIN <=2 SENSITIVE Sensitive     CEFAZOLIN <=4 SENSITIVE Sensitive     CEFEPIME <=1 SENSITIVE Sensitive     CEFTAZIDIME <=1 SENSITIVE Sensitive     CEFTRIAXONE <=1 SENSITIVE Sensitive     CIPROFLOXACIN <=0.25 SENSITIVE Sensitive     GENTAMICIN <=1 SENSITIVE Sensitive     IMIPENEM 1 SENSITIVE Sensitive     TRIMETH/SULFA <=20 SENSITIVE Sensitive     AMPICILLIN/SULBACTAM <=2 SENSITIVE Sensitive  PIP/TAZO <=4 SENSITIVE Sensitive     * PROTEUS MIRABILIS  Blood Culture ID Panel (Reflexed)     Status: Abnormal   Collection Time: 11/30/16 11:42 PM  Result Value Ref Range Status   Enterococcus species NOT DETECTED NOT DETECTED Final   Listeria monocytogenes NOT DETECTED NOT DETECTED Final   Staphylococcus species NOT DETECTED NOT  DETECTED Final   Staphylococcus aureus NOT DETECTED NOT DETECTED Final   Streptococcus species NOT DETECTED NOT DETECTED Final   Streptococcus agalactiae NOT DETECTED NOT DETECTED Final   Streptococcus pneumoniae NOT DETECTED NOT DETECTED Final   Streptococcus pyogenes NOT DETECTED NOT DETECTED Final   Acinetobacter baumannii NOT DETECTED NOT DETECTED Final   Enterobacteriaceae species DETECTED (A) NOT DETECTED Final    Comment: Enterobacteriaceae represent a large family of gram-negative bacteria, not a single organism. CRITICAL RESULT CALLED TO, READ BACK BY AND VERIFIED WITH: [place] Sl[place] PHARMD [id number] 12/01/16 A BROWNING    Enterobacter cloacae complex NOT DETECTED NOT DETECTED Final   Escherichia coli NOT DETECTED NOT DETECTED Final   Klebsiella oxytoca NOT DETECTED NOT DETECTED Final   Klebsiella pneumoniae NOT DETECTED NOT DETECTED Final   Proteus species DETECTED (A) NOT DETECTED Final    Comment: CRITICAL RESULT CALLED TO, READ BACK BY AND VERIFIED WITH: [place] Sl[place] PHARMD [id number] 12/01/16 A BROWNING    Serratia marcescens NOT DETECTED NOT DETECTED Final   Carbapenem resistance NOT DETECTED NOT DETECTED Final   Haemophilus influenzae NOT DETECTED NOT DETECTED Final   Neisseria meningitidis NOT DETECTED NOT DETECTED Final   Pseudomonas aeruginosa NOT DETECTED NOT DETECTED Final   Candida albicans NOT DETECTED NOT DETECTED Final   Candida glabrata NOT DETECTED NOT DETECTED Final   Candida krusei NOT DETECTED NOT DETECTED Final   Candida parapsilosis NOT DETECTED NOT DETECTED Final   Candida tropicalis NOT DETECTED NOT DETECTED Final    Comment: Performed at [facility], [place] [address], [place], [place] [address]  Culture, blood (Routine x 2)     Status: Abnormal   Collection Time: 12/01/16 12:23 AM  Result Value Ref Range Status   Specimen Description BLOOD RIGHT HAND  Final   Special Requests IN PEDIATRIC BOTTLE Blood Culture adequate volume  Final   Culture  Setup Time    Final    GRAM NEGATIVE RODS IN PEDIATRIC BOTTLE CRITICAL RESULT CALLED TO, READ BACK BY AND VERIFIED WITH: [place] 12/01/16 A BROWNING    Culture (A)  Final    PROTEUS MIRABILIS SUSCEPTIBILITIES PERFORMED ON PREVIOUS CULTURE WITHIN THE LAST 5 DAYS. Performed at [facility], [place] [address], [place], [place] [address]    Report Status 12/03/2016 FINAL  Final  Culture, blood (routine x 2)     Status: None (Preliminary result)   Collection Time: 12/02/16 11:18 AM  Result Value Ref Range Status   Specimen Description BLOOD RIGHT ANTECUBITAL  Final   Special Requests IN PEDIATRIC BOTTLE Blood Culture adequate volume  Final   Culture   Final    NO GROWTH 3 DAYS Performed at [facility], [place] [address] [place], [place] [address]    Report Status PENDING  Incomplete  Culture, blood (routine x 2)     Status: None (Preliminary result)   Collection Time: 12/02/16 11:23 AM  Result Value Ref Range Status   Specimen Description BLOOD LEFT HAND  Final   Special Requests IN PEDIATRIC BOTTLE Blood Culture adequate volume  Final   Culture  Final    NO GROWTH 3 DAYS Performed at [place] [facility], [place] [address], [place], [place] [address]    Report Status PENDING  Incomplete  C difficile quick scan w PCR reflex     Status: None   Collection Time: 12/02/16  1:25 PM  Result Value Ref Range Status   C Diff antigen NEGATIVE NEGATIVE Final   C Diff toxin NEGATIVE NEGATIVE Final   C Diff interpretation No C. difficile detected.  Final     Radiology Studies: Dg Chest 2 View  Result Date: 12/05/2016 CLINICAL DATA:  Shortness of breath today. EXAM: CHEST  2 VIEW COMPARISON:  12/02/2016 and 09/17/2016 FINDINGS: Lungs are adequately inflated without focal consolidation or effusion. Mild stable cardiomegaly. Remainder of the exam is unchanged. IMPRESSION: No acute cardiopulmonary disease. Stable cardiomegaly. Electronically Signed   By: [place] Olp M.D.   On: 12/05/2016  12:41    Scheduled Meds: . aspirin EC  81 mg Oral Daily  . carvedilol  3.125 mg Oral BID WC  . enoxaparin (LOVENOX) injection  30 mg Subcutaneous Q24H  . famotidine  20 mg Oral QHS  . ferrous sulfate  325 mg Oral Q breakfast  . guaiFENesin  600 mg Oral BID  . insulin aspart  0-15 Units Subcutaneous TID WC  . insulin aspart  0-5 Units Subcutaneous QHS  . insulin glargine  15 Units Subcutaneous Q2200  . ipratropium-albuterol  3 mL Nebulization BID  . sertraline  100 mg Oral QPC breakfast   Continuous Infusions: . meropenem (MERREM) IV Stopped (12/05/16 2347)     LOS: 5 days    Time spent in minutes: 35  Signature  Lala Lund M.D on 12/06/2016 at 9:37 AM  Between 7am to 7pm - Pager - [phone] ( page via [email or web address], text pages only, please mention full 10 digit call back number).  After 7pm go to [email or web address] - password [facility]

## 2016-12-07 LAB — CULTURE, BLOOD (ROUTINE X 2)
Culture: NO GROWTH
Culture: NO GROWTH
SPECIAL REQUESTS: ADEQUATE
Special Requests: ADEQUATE

## 2016-12-07 LAB — GLUCOSE, CAPILLARY
GLUCOSE-CAPILLARY: 126 mg/dL — AB (ref 65–99)
Glucose-Capillary: 194 mg/dL — ABNORMAL HIGH (ref 65–99)
Glucose-Capillary: 127 mg/dL — ABNORMAL HIGH (ref 65–99)

## 2016-12-07 NOTE — Care Management Important Message (Signed)
Important Message  Patient Details  Name: Carrie Atkins MRN: [id number] Date of Birth: 1934-03-25   Medicare Important Message Given:  Yes    Kerin Salen 12/07/2016, 11:15 AMImportant Message  Patient Details  Name: Carrie Atkins MRN: [id number] Date of Birth: 1933/09/02   Medicare Important Message Given:  Yes    Kerin Salen 12/07/2016, 11:15 AM

## 2016-12-07 NOTE — Progress Notes (Signed)
CSW followed up with patient's daughter and confirmed patient's plan to discharge to [facility]. Patient's daughter reported that patient will need PTAR at discharge. CSW and patient's daughter discussed that patient is in copay days, daughter verbalized understanding and agreement. CSW contacted [facility] and confirmed patient's bed offer. CSW will continue to follow patient and assist with discharge planning to SNF when medically stable.   Abundio Miu, [place] Social Worker [facility] ([facility]) Cell#: [phone]

## 2016-12-07 NOTE — Progress Notes (Signed)
PROGRESS NOTE    Carrie Atkins   WLN:[id number]  DOB: [birth year]  DOA: 11/30/2016 PCP: Carrie Rasmussen, MD   Brief Narrative:   Carrie Bourgoin Siegneris a 83 y.o.femalewith a past medical history significant for CLL, IDDM, HTN, and ESBL UTIwho presents with confusion for 1 day, dysuria and malaise. She was found to have a UTI and admitted to the hospital for further management. She had pansensitive Proteus Escherichia coli bacteremia and ESBL UTI, will finish her IV antibiotics were ESBL UTI on 12/08/2016 thereafter can be switched to 7 more days of oral Vantin and discharged to SNF.   Subjective:  Patient in bed, appears comfortable, denies any headache, no fever, no chest pain or pressure, no shortness of breath , no abdominal pain. No focal weakness.  Assessment & Plan:  Severe Sepsis due to pansensitive Proteus UTI and bacteremia, Escherichia coli ESBL UTI - was septic upon admission, was treated with empiric antibiotics, currently on meropenem for ESBL UTI, likely will give total of 3 days of IV antibiotic for ESBL UTI, she can be switched to oral Vantin for total of 7 more days on 12/08/2016 and likely be discharged to SNF. She is clinically much better and sepsis physiology has resolved. Likely SNF soon . Note ESBL Escherichia coli was in the urine, she had Proteus pan sensitive bacteremia.  Mild nonspecific leukocytosis could be related to underlying CLL, no signs of new infection, UTI as above and getting appropriately treated, chest x-ray remains clear, afebrile and nontoxic.  Acute on chronic diastolic CHF EF 74% -  recent echo noted with EF of 60% and grade 1 diastolic dysfunction, BNP was extremely elevated and she had audible wheezes on exam, responding extremely well to IV Lasix which will be continued, now off oxygen.   AKI - renal ultrasound nonacute, post void bladder scans are stable 2, renal function responding well to diuresis.  HTN (hypertension) - resume Coreg, I  do not think this is contributing to wheezing as she is still wheezing today without Coreg for over 24-48 hours.  Paroxysmal atrial fibrillation - Hold Coreg for today - not on anticoagulation at home due to fall risk, defer this to PCP and primary cardiologist. Continue aspirin. [place] vasc 2 score of at least 3.  CLL history of. Supportive care for now  Diarrhea antibiotic induced  - was neg for C diff and she diabetic induced.  Generalized weakness and deconditioning. TSH stable, will require SNF seen by PT.  DM2-  hypoglycemia due to poor oral intake - A1c 12.6 on 4/18  - Amaryl, Lantus and SSI at home, due to ARF Amaryl discontinued, Lantus has been can back on SSI will monitor.  CBG (last 3)   Recent Labs  12/06/16 1813 12/06/16 2212 12/07/16 0743  GLUCAP 211* 206* 126*    DVT prophylaxis: Lovenox  Code Status: DNR Family Communication:  Disposition Plan:  SNF 1-2 days Consultants: ID physician Dr. Megan Atkins over the phone    Procedures:     Antimicrobials:  Anti-infectives    Start     Dose/Rate Route Frequency Ordered Stop   12/05/16 0630  meropenem (MERREM) 1 g in sodium chloride 0.9 % 100 mL IVPB     1 g 200 mL/hr over 30 Minutes Intravenous Every 12 hours 12/05/16 0621     12/03/16 2000  Ampicillin-Sulbactam (UNASYN) 3 g in sodium chloride 0.9 % 100 mL IVPB  Status:  Discontinued     3 g 200 mL/hr over  30 Minutes Intravenous Every 12 hours 12/03/16 1504 12/05/16 0615   12/01/16 0600  piperacillin-tazobactam (ZOSYN) IVPB 3.375 g  Status:  Discontinued     3.375 g 12.5 mL/hr over 240 Minutes Intravenous Every 8 hours 12/01/16 0501 12/03/16 1504   12/01/16 0015  piperacillin-tazobactam (ZOSYN) IVPB 3.375 g     3.375 g 100 mL/hr over 30 Minutes Intravenous  Once 12/01/16 0012 12/01/16 0126   12/01/16 0015  vancomycin (VANCOCIN) IVPB 1000 mg/200 mL premix     1,000 mg 200 mL/hr over 60 Minutes Intravenous  Once 12/01/16 0012 12/01/16 0241        Objective: Vitals:   12/06/16 2010 12/06/16 2216 12/07/16 0638 12/07/16 0800  BP:  127/63 (!) 135/56   Pulse:  70 67   Resp:  18 17   Temp:  98.7 F (37.1 C) 99 F (37.2 C)   TempSrc:  Oral Oral   SpO2: 98% 96% 95% 94%  Weight:   85.8 kg (189 lb 2.5 oz)   Height:        Intake/Output Summary (Last 24 hours) at 12/07/16 1043 Last data filed at 12/06/16 1200  Gross per 24 hour  Intake              322 ml  Output                0 ml  Net              322 ml   Filed Weights   12/05/16 0500 12/06/16 0646 12/07/16 0638  Weight: 87.1 kg (192 lb 0.3 oz) 85.5 kg (188 lb 7.9 oz) 85.8 kg (189 lb 2.5 oz)    Examination:  Awake Alert, Oriented X 2, No new F.N deficits, Normal affect [name],PERRAL Supple Neck,No JVD, No cervical lymphadenopathy appriciated.  Symmetrical Chest wall movement, Good air movement bilaterally, CTAB RRR,No Gallops,Rubs or new Murmurs, No Parasternal Heave +ve B.Sounds, Abd Soft, No tenderness, No organomegaly appriciated, No rebound - guarding or rigidity. No Cyanosis, Clubbing or edema, No new Rash or bruise    Data Reviewed: I have personally reviewed following labs and imaging studies  CBC:  Recent Labs Lab 11/30/16 2342  12/02/16 0710 12/03/16 0547 12/04/16 0507 12/05/16 0519 12/06/16 0526  WBC 26.5*  < > 20.5* 16.3* 15.8* 15.9* 16.1*  NEUTROABS 23.8*  --   --   --   --   --   --   HGB 11.4*  < > 10.0* 9.4* 9.6* 9.7* 10.4*  HCT 34.6*  < > 30.5* 28.8* 28.7* 28.6* 31.0*  MCV 92.0  < > 93.6 91.4 93.2 93.8 92.5  PLT 142*  < > 100* 106* 111* 145* 151  < > = values in this interval not displayed. Basic Metabolic Panel:  Recent Labs Lab 12/02/16 0710 12/03/16 0547 12/04/16 0507 12/05/16 0519 12/06/16 0526  NA 139 136 139 137 137  K 3.5 3.6 3.4* 4.2 4.2  CL 108 107 107 107 104  CO2 22 20* 20* 20* 22  GLUCOSE 58* 65 83 90 106*  BUN 47* 39* 47* 43* 44*  CREATININE 2.16* 1.80* 1.70* 1.49* 1.45*  CALCIUM 8.1* 8.2* 8.5* 8.8* 9.0    GFR: Estimated Creatinine Clearance: 30.3 mL/min (A) (by C-G formula based on SCr of 1.45 mg/dL (H)). Liver Function Tests:  Recent Labs Lab 11/30/16 2342  AST 25  ALT 20  ALKPHOS 68  BILITOT 0.7  PROT 6.9  ALBUMIN 3.6   No results for input(s): LIPASE,  AMYLASE in the last 168 hours. No results for input(s): AMMONIA in the last 168 hours. Coagulation Profile:  Recent Labs Lab 11/30/16 2342  INR 1.07   Cardiac Enzymes: No results for input(s): CKTOTAL, CKMB, CKMBINDEX, TROPONINI in the last 168 hours. BNP (last 3 results) No results for input(s): PROBNP in the last 8760 hours. HbA1C: No results for input(s): HGBA1C in the last 72 hours. CBG:  Recent Labs Lab 12/06/16 0841 12/06/16 1231 12/06/16 1813 12/06/16 2212 12/07/16 0743  GLUCAP 106* 186* 211* 206* 126*   Lipid Profile: No results for input(s): CHOL, HDL, LDLCALC, TRIG, CHOLHDL, LDLDIRECT in the last 72 hours. Thyroid Function Tests:  Recent Labs  12/04/16 1157  TSH 3.805   Anemia Panel: No results for input(s): VITAMINB12, FOLATE, FERRITIN, TIBC, IRON, RETICCTPCT in the last 72 hours. Urine analysis:    Component Value Date/Time   COLORURINE YELLOW 11/30/2016 2333   APPEARANCEUR HAZY (A) 11/30/2016 2333   LABSPEC 1.020 11/30/2016 2333   PHURINE 5.0 11/30/2016 2333   GLUCOSEU NEGATIVE 11/30/2016 2333   HGBUR MODERATE (A) 11/30/2016 2333   BILIRUBINUR NEGATIVE 11/30/2016 2333   KETONESUR NEGATIVE 11/30/2016 2333   PROTEINUR NEGATIVE 11/30/2016 2333   NITRITE NEGATIVE 11/30/2016 2333   LEUKOCYTESUR LARGE (A) 11/30/2016 2333   Sepsis Labs: @LABRCNTIP (procalcitonin:4,lacticidven:4) ) Recent Results (from the past 240 hour(s))  Urine culture     Status: Abnormal   Collection Time: 11/30/16 11:33 PM  Result Value Ref Range Status   Specimen Description URINE, CATHETERIZED  Final   Special Requests NONE  Final   Culture (A)  Final    >=100,000 COLONIES/mL ESCHERICHIA COLI Confirmed  Extended Spectrum Beta-Lactamase Producer (ESBL) Performed at [facility], [place] [address], [place], [place] [address]    Report Status 12/03/2016 FINAL  Final   Organism ID, Bacteria ESCHERICHIA COLI (A)  Final      Susceptibility   Escherichia coli - MIC*    AMPICILLIN >=32 RESISTANT Resistant     CEFAZOLIN >=64 RESISTANT Resistant     CEFTRIAXONE >=64 RESISTANT Resistant     CIPROFLOXACIN >=4 RESISTANT Resistant     GENTAMICIN >=16 RESISTANT Resistant     IMIPENEM <=0.25 SENSITIVE Sensitive     NITROFURANTOIN <=16 SENSITIVE Sensitive     TRIMETH/SULFA >=320 RESISTANT Resistant     AMPICILLIN/SULBACTAM 8 SENSITIVE Sensitive     PIP/TAZO <=4 SENSITIVE Sensitive     Extended ESBL POSITIVE Resistant     * >=100,000 COLONIES/mL ESCHERICHIA COLI  Culture, blood (Routine x 2)     Status: Abnormal   Collection Time: 11/30/16 11:42 PM  Result Value Ref Range Status   Specimen Description BLOOD LEFT WRIST  Final   Special Requests   Final    BOTTLES DRAWN AEROBIC AND ANAEROBIC Blood Culture adequate volume   Culture  Setup Time   Final    GRAM NEGATIVE RODS IN BOTH AEROBIC AND ANAEROBIC BOTTLES CRITICAL RESULT CALLED TO, READ BACK BY AND VERIFIED WITH: [place] 12/01/16 A BROWNING Performed at [facility], [address] [place], [place] [address]    Culture PROTEUS MIRABILIS (A)  Final   Report Status 12/03/2016 FINAL  Final   Organism ID, Bacteria PROTEUS MIRABILIS  Final      Susceptibility   Proteus mirabilis - MIC*    AMPICILLIN <=2 SENSITIVE Sensitive     CEFAZOLIN <=4 SENSITIVE Sensitive     CEFEPIME <=1 SENSITIVE Sensitive     CEFTAZIDIME <=1 SENSITIVE  Sensitive     CEFTRIAXONE <=1 SENSITIVE Sensitive     CIPROFLOXACIN <=0.25 SENSITIVE Sensitive     GENTAMICIN <=1 SENSITIVE Sensitive     IMIPENEM 1 SENSITIVE Sensitive     TRIMETH/SULFA <=20 SENSITIVE Sensitive     AMPICILLIN/SULBACTAM <=2 SENSITIVE Sensitive     PIP/TAZO <=4 SENSITIVE  Sensitive     * PROTEUS MIRABILIS  Blood Culture ID Panel (Reflexed)     Status: Abnormal   Collection Time: 11/30/16 11:42 PM  Result Value Ref Range Status   Enterococcus species NOT DETECTED NOT DETECTED Final   Listeria monocytogenes NOT DETECTED NOT DETECTED Final   Staphylococcus species NOT DETECTED NOT DETECTED Final   Staphylococcus aureus NOT DETECTED NOT DETECTED Final   Streptococcus species NOT DETECTED NOT DETECTED Final   Streptococcus agalactiae NOT DETECTED NOT DETECTED Final   Streptococcus pneumoniae NOT DETECTED NOT DETECTED Final   Streptococcus pyogenes NOT DETECTED NOT DETECTED Final   Acinetobacter baumannii NOT DETECTED NOT DETECTED Final   Enterobacteriaceae species DETECTED (A) NOT DETECTED Final    Comment: Enterobacteriaceae represent a large family of gram-negative bacteria, not a single organism. CRITICAL RESULT CALLED TO, READ BACK BY AND VERIFIED WITH: [place] Slider PHARMD [id number] 12/01/16 A BROWNING    Enterobacter cloacae complex NOT DETECTED NOT DETECTED Final   Escherichia coli NOT DETECTED NOT DETECTED Final   Klebsiella oxytoca NOT DETECTED NOT DETECTED Final   Klebsiella pneumoniae NOT DETECTED NOT DETECTED Final   Proteus species DETECTED (A) NOT DETECTED Final    Comment: CRITICAL RESULT CALLED TO, READ BACK BY AND VERIFIED WITH: [place] Slider PHARMD [id number] 12/01/16 A BROWNING    Serratia marcescens NOT DETECTED NOT DETECTED Final   Carbapenem resistance NOT DETECTED NOT DETECTED Final   Haemophilus influenzae NOT DETECTED NOT DETECTED Final   Neisseria meningitidis NOT DETECTED NOT DETECTED Final   Pseudomonas aeruginosa NOT DETECTED NOT DETECTED Final   Candida albicans NOT DETECTED NOT DETECTED Final   Candida glabrata NOT DETECTED NOT DETECTED Final   Candida krusei NOT DETECTED NOT DETECTED Final   Candida parapsilosis NOT DETECTED NOT DETECTED Final   Candida tropicalis NOT DETECTED NOT DETECTED Final    Comment: Performed at [facility], [place] [address] [place], [place] [address]  Culture, blood (Routine x 2)     Status: Abnormal   Collection Time: 12/01/16 12:23 AM  Result Value Ref Range Status   Specimen Description BLOOD RIGHT HAND  Final   Special Requests IN PEDIATRIC BOTTLE Blood Culture adequate volume  Final   Culture  Setup Time   Final    GRAM NEGATIVE RODS IN PEDIATRIC BOTTLE CRITICAL RESULT CALLED TO, READ BACK BY AND VERIFIED WITH: [place] 12/01/16 A BROWNING    Culture (A)  Final    PROTEUS MIRABILIS SUSCEPTIBILITIES PERFORMED ON PREVIOUS CULTURE WITHIN THE LAST 5 DAYS. Performed at [facility], [place] [address], [place], [place] [address]    Report Status 12/03/2016 FINAL  Final  Culture, blood (routine x 2)     Status: None (Preliminary result)   Collection Time: 12/02/16 11:18 AM  Result Value Ref Range Status   Specimen Description BLOOD RIGHT ANTECUBITAL  Final   Special Requests IN PEDIATRIC BOTTLE Blood Culture adequate volume  Final   Culture   Final    NO GROWTH 4 DAYS Performed at [facility], [place] [address], [place], [place] [address]    Report Status PENDING  Incomplete  Culture, blood (routine x 2)     Status: None (Preliminary result)   Collection Time: 12/02/16 11:23 AM  Result Value Ref Range Status   Specimen Description BLOOD LEFT HAND  Final   Special Requests IN PEDIATRIC BOTTLE Blood Culture adequate volume  Final   Culture   Final    NO GROWTH 4 DAYS Performed at [facility], [place] [address] [place], [place] [address]    Report Status PENDING  Incomplete  C difficile quick scan w PCR reflex     Status: None   Collection Time: 12/02/16  1:25 PM  Result Value Ref Range Status   C Diff antigen NEGATIVE NEGATIVE Final   C Diff toxin NEGATIVE NEGATIVE Final   C Diff interpretation No C. difficile detected.  Final     Radiology Studies: Dg Chest 2 View  Result Date: 12/05/2016 CLINICAL DATA:  Shortness of breath today. EXAM:  CHEST  2 VIEW COMPARISON:  12/02/2016 and 09/17/2016 FINDINGS: Lungs are adequately inflated without focal consolidation or effusion. Mild stable cardiomegaly. Remainder of the exam is unchanged. IMPRESSION: No acute cardiopulmonary disease. Stable cardiomegaly. Electronically Signed   By: Marin Olp M.D.   On: 12/05/2016 12:41    Scheduled Meds: . aspirin EC  81 mg Oral Daily  . carvedilol  3.125 mg Oral BID WC  . enoxaparin (LOVENOX) injection  30 mg Subcutaneous Q24H  . famotidine  20 mg Oral QHS  . ferrous sulfate  325 mg Oral Q breakfast  . furosemide  40 mg Intravenous BID  . guaiFENesin  600 mg Oral BID  . insulin aspart  0-15 Units Subcutaneous TID WC  . insulin aspart  0-5 Units Subcutaneous QHS  . insulin glargine  15 Units Subcutaneous Q2200  . ipratropium-albuterol  3 mL Nebulization BID  . sertraline  100 mg Oral QPC breakfast   Continuous Infusions: . meropenem (MERREM) IV Stopped (12/06/16 2224)     LOS: 6 days    Time spent in minutes: 35  Signature  Lala Lund M.D on 12/07/2016 at 10:43 AM  Between 7am to 7pm - Pager - [phone] ( page via [email or web address], text pages only, please mention full 10 digit call back number).  After 7pm go to [email or web address] - password [facility]

## 2016-12-07 NOTE — Progress Notes (Signed)
Physical Therapy Treatment Patient Details Name: Carrie Atkins MRN: [id number] DOB: 1934-01-12 Today's Date: 12/07/2016    History of Present Illness 83 y.o. female with a past medical history significant for CLL, IDDM with neuropathy, HTN, and ESBL UTI and admitted with Severe Sepsis due to E coli UTI and Proteus bacteremia      PT Comments    Pt OOB in recliner on arrival.  Found incont loose stools so assisted to [facility].  Pt required increased assist and struggled to complete 1/4 turn.  + 2 assist called.  Assisted off BSC to static standing just long enough to assist with peri care.  Pt had to sit right back down due to fatigue/weakness.  Assisted with standing one more time and take a few side steps to [facility].  Required + 2 assist to transfer from sitting to supine and to scoot to [facility].   Pt lives in an apt behind daughters house.  Will need Rehab at SNF prior to returing.   Follow Up Recommendations  SNF     Equipment Recommendations  None recommended by PT    Recommendations for Other Services       Precautions / Restrictions Precautions Precautions: Fall Restrictions Weight Bearing Restrictions: No    Mobility  Bed Mobility Overal bed mobility: Needs Assistance Bed Mobility: Sit to Supine       Sit to supine: +2 for physical assistance;+2 for safety/equipment   General bed mobility comments: assisted back to bed + 2 assist to scoot to [facility] and reposition to comfort  Transfers Overall transfer level: Needs assistance Equipment used: Rolling walker (2 wheeled) Transfers: Sit to/from [facility] Sit to Stand: +2 physical assistance;+2 safety/equipment;Max assist Stand pivot transfers: +2 physical assistance;+2 safety/equipment;Max assist       General transfer comment: required increased assist to rise from recliner to [facility] then from [facility] to bed.  Increased difficulty due to weakness esp B LE's  Ambulation/Gait             General Gait Details: pt  was only able to take a few side steps to bed.  Very unsteady.  Weak.    Stairs            Wheelchair Mobility    Modified Rankin (Stroke Patients Only)       Balance                                            Cognition Arousal/Alertness: Awake/alert                                     General Comments: slightly impaired cognition but improving      Exercises      General Comments        Pertinent Vitals/Pain Pain Assessment: No/denies pain    Home Living                      Prior Function            PT Goals (current goals can now be found in the care plan section) Progress towards PT goals: Progressing toward goals    Frequency    Min 3X/week      PT Plan      Co-evaluation  AM-PAC PT "6 Clicks" Daily Activity  Outcome Measure  Difficulty turning over in bed (including adjusting bedclothes, sheets and blankets)?: Total Difficulty moving from lying on back to sitting on the side of the bed? : Total Difficulty sitting down on and standing up from a chair with arms (e.g., wheelchair, bedside commode, etc,.)?: Total Help needed moving to and from a bed to chair (including a wheelchair)?: Total Help needed walking in hospital room?: Total Help needed climbing 3-5 steps with a railing? : Total 6 Click Score: 6    End of Session Equipment Utilized During Treatment: Gait belt Activity Tolerance: Patient limited by fatigue Patient left: in bed;with call bell/phone within reach;with bed alarm set Nurse Communication: Mobility status (incont loose stools on arival) PT Visit Diagnosis: Other abnormalities of gait and mobility (R26.89)     Time: 1400-1430 PT Time Calculation (min) (ACUTE ONLY): 30 min  Charges:  $Therapeutic Activity: 23-37 mins                    G Codes:       Rica Koyanagi  PTA [facility]  Acute  Rehab Pager      [phone]

## 2016-12-08 LAB — GLUCOSE, CAPILLARY
GLUCOSE-CAPILLARY: 126 mg/dL — AB (ref 65–99)
GLUCOSE-CAPILLARY: 237 mg/dL — AB (ref 65–99)
GLUCOSE-CAPILLARY: 133 mg/dL — AB (ref 65–99)
Glucose-Capillary: 202 mg/dL — ABNORMAL HIGH (ref 65–99)
Glucose-Capillary: 211 mg/dL — ABNORMAL HIGH (ref 65–99)

## 2016-12-08 LAB — CREATININE, SERUM
CREATININE: 1.81 mg/dL — AB (ref 0.44–1.00)
GFR calc Af Amer: 29 mL/min — ABNORMAL LOW (ref >60)
GFR calc non Af Amer: 25 mL/min — ABNORMAL LOW (ref >60)

## 2016-12-08 MED ORDER — SODIUM CHLORIDE 0.9 % IV SOLN
500.0000 mg | Freq: Two times a day (BID) | INTRAVENOUS | Status: DC
  Administered 2016-12-08 – 2016-12-09 (×2): 500 mg via INTRAVENOUS
  Filled 2016-12-08 (×3): qty 0.5

## 2016-12-08 NOTE — Progress Notes (Signed)
Pharmacy Antibiotic Note  Carrie Atkins is a 83 y.o. female admitted on 11/30/2016 with sepsis secondary to ESBL E.coli UTI and Proteus mirabilis bacteremia.  Pharmacy has been consulted for meropenem dosing.  SCr worsened today, warranting antibiotic dose adjustment.  Repeat blood cultures negative.  Plan: Adjust meropenem dose to 500 mg IV q12h for CrCl<30 ml/min. Consider amoxicillin 500 mg q8h until July 3rd to complete 14-day course for Proteus bacteremia when ready for transition to PO abx.   Height: 5\' 1"  (154.9 cm) Weight: 182 lb 15.7 oz (83 kg) IBW/kg (Calculated) : 47.8  Temp (24hrs), Avg:98.9 F (37.2 C), Min:98.5 F (36.9 C), Max:99.6 F (37.6 C)   Recent Labs Lab 12/02/16 0710 12/03/16 0547 12/04/16 0507 12/05/16 0519 12/06/16 0526 12/08/16 0456  WBC 20.5* 16.3* 15.8* 15.9* 16.1*  --   CREATININE 2.16* 1.80* 1.70* 1.49* 1.45* 1.81*  LATICACIDVEN 1.0  --   --   --   --   --     Estimated Creatinine Clearance: 23.8 mL/min (A) (by C-G formula based on SCr of 1.81 mg/dL (H)).    Allergies  Allergen Reactions  . Sulfonamide Derivatives Nausea And Vomiting  . Hydrocodone Nausea And Vomiting  . Sulfa Antibiotics Nausea And Vomiting    Antimicrobials this admission:  Vancomycin 05/21/2017 x1 Zosyn 05/21/2017 >> 6/21 621 Unasyn >> 6/23 6/23 meropenem >>  Microbiology results:  6/20 BCx: NGF 6/18 BCx: BCID=Proteus, pan-sensitive 6/18 UCx: >100k ESBL Ecoli  Thank you for allowing pharmacy to be a part of this patient's care.  Hershal Coria 12/08/2016 1:30 PM

## 2016-12-08 NOTE — Progress Notes (Signed)
PROGRESS NOTE    Carrie Atkins  QBV:[id number] DOB: April 28, 1934 DOA: 11/30/2016 PCP: [name], MD    Brief Narrative:  83 year old female who presented to the hospital with confusion, weakness and dysuria. Patient is known to have CLL, type 2 diabetes mellitus, hypertension and history of urinary tract infections associated with ESBL organisms. Symptoms present for last 24 hours prior to hospitalization. On initial physical examination, patient was septic, temperature 101.7, heart rate 73, respiratory 30, blood pressure 110/48, oximetry 89% room air. Her lungs were clear to auscultation bilaterally, no wheezing rales or rhonchi, heart S1-S2 present tachycardic, abdomen soft nontender, lower extremities no edema, poor judgment and insight. Patient admitted to the hospital with working diagnosis of sepsis.   Assessment & Plan:   Principal Problem:   Sepsis ([place]) Active Problems:   HTN (hypertension)   CLL (chronic lymphocytic leukemia) (HCC)   Diabetic polyneuropathy associated with type 2 diabetes mellitus (HCC)   Acute encephalopathy   Paroxysmal atrial fibrillation (HCC)   History of CHF (congestive heart failure)   Lactic acid acidosis   Tachypnea   Wheezing   Acute lower UTI   Bacteremia   1. Sepsis due to urinary tract infection, due to Escherichia coli (ESBL) and Proteus mirabilis, present on admission. Will continue antibiotic therapy with meropenem #3/10, to consider completing full course of therapy with IV meropenem at [facility], through a peripheral line, patient will need antibiotic therapy for less than one week.    2.  Acute kidney injury.  Renal function has been unstable. Cr 0.83 in April 2018, will hold on furosemide, and will follow on renal panel in am, avoid hypotension. Will hold on IV fluids for now, due to recovering from heart failure decompensation.   3. Decompensated diastolic heart failure, acute and chronic. Patient more euvolemic, will  continue blood pressure monitoring, carvedilol, no ace inh due to risk of worsening renal function.  4. Hypertension. Blood pressure control with carvedilol, blood pressure systolic 280 to 034.   5. Paroxysmal atrial fibrillation. Rate controlled with carvedilol.   6. Type 2 diabetes mellitus. Continue glucose cover and monitoring with sliding scale, patient tolerating po well, capillary glucose, 127, 202, 126, 237. Basal insulin with glargine 15 units.    DVT prophylaxis: heparin  Code Status: DNR  Family Communication:  Disposition Plan:    Consultants:     Procedures:    Antimicrobials:    Subjective: Patient very weak and deconditioned, has been not out of bed yet, no nausea or vomiting, no diarrhea. No chest pain or dyspnea.  Objective: Vitals:   12/07/16 1427 12/07/16 2158 12/08/16 0702 12/08/16 0905  BP: 130/68 122/74 (!) 118/54   Pulse: 72 99 70   Resp: 18 17 18    Temp: 98.5 F (36.9 C) 99.6 F (37.6 C) 98.5 F (36.9 C)   TempSrc: Oral Oral Oral   SpO2: 95% 94% 94% 96%  Weight:   83 kg (182 lb 15.7 oz)   Height:        Intake/Output Summary (Last 24 hours) at 12/08/16 0950 Last data filed at 12/08/16 0200  Gross per 24 hour  Intake              440 ml  Output              350 ml  Net               90 ml   Filed Weights   12/06/16 0646  12/07/16 8527 12/08/16 0702  Weight: 85.5 kg (188 lb 7.9 oz) 85.8 kg (189 lb 2.5 oz) 83 kg (182 lb 15.7 oz)    Examination:  General exam: deconditioned E ENT: positive pallor, no icterus, oral mucosa dry.  Respiratory system: No wheezing, rales or rhonchi. Decreased inspiratory effort. Cardiovascular system: S1 & S2 heard, RRR. No JVD, murmurs, rubs, gallops or clicks. No pedal edema. Gastrointestinal system: Abdomen is nondistended, soft and nontender. No organomegaly or masses felt. Normal bowel sounds heard. Central nervous system: Alert and oriented. No focal neurological deficits. Extremities: Symmetric 5 x 5  power. Skin: No rashes, lesions or ulcers     Data Reviewed: I have personally reviewed following labs and imaging studies  CBC:  Recent Labs Lab 12/02/16 0710 12/03/16 0547 12/04/16 0507 12/05/16 0519 12/06/16 0526  WBC 20.5* 16.3* 15.8* 15.9* 16.1*  HGB 10.0* 9.4* 9.6* 9.7* 10.4*  HCT 30.5* 28.8* 28.7* 28.6* 31.0*  MCV 93.6 91.4 93.2 93.8 92.5  PLT 100* 106* 111* 145* 782   Basic Metabolic Panel:  Recent Labs Lab 12/02/16 0710 12/03/16 0547 12/04/16 0507 12/05/16 0519 12/06/16 0526 12/08/16 0456  NA 139 136 139 137 137  --   K 3.5 3.6 3.4* 4.2 4.2  --   CL 108 107 107 107 104  --   CO2 22 20* 20* 20* 22  --   GLUCOSE 58* 65 83 90 106*  --   BUN 47* 39* 47* 43* 44*  --   CREATININE 2.16* 1.80* 1.70* 1.49* 1.45* 1.81*  CALCIUM 8.1* 8.2* 8.5* 8.8* 9.0  --    GFR: Estimated Creatinine Clearance: 23.8 mL/min (A) (by C-G formula based on SCr of 1.81 mg/dL (H)). Liver Function Tests: No results for input(s): AST, ALT, ALKPHOS, BILITOT, PROT, ALBUMIN in the last 168 hours. No results for input(s): LIPASE, AMYLASE in the last 168 hours. No results for input(s): AMMONIA in the last 168 hours. Coagulation Profile: No results for input(s): INR, PROTIME in the last 168 hours. Cardiac Enzymes: No results for input(s): CKTOTAL, CKMB, CKMBINDEX, TROPONINI in the last 168 hours. BNP (last 3 results) No results for input(s): PROBNP in the last 8760 hours. HbA1C: No results for input(s): HGBA1C in the last 72 hours. CBG:  Recent Labs Lab 12/07/16 0743 12/07/16 1240 12/07/16 1804 12/07/16 2244 12/08/16 0805  GLUCAP 126* 194* 127* 202* 126*   Lipid Profile: No results for input(s): CHOL, HDL, LDLCALC, TRIG, CHOLHDL, LDLDIRECT in the last 72 hours. Thyroid Function Tests: No results for input(s): TSH, T4TOTAL, FREET4, T3FREE, THYROIDAB in the last 72 hours. Anemia Panel: No results for input(s): VITAMINB12, FOLATE, FERRITIN, TIBC, IRON, RETICCTPCT in the last 72  hours. Sepsis Labs:  Recent Labs Lab 12/02/16 0710  LATICACIDVEN 1.0    Recent Results (from the past 240 hour(s))  Urine culture     Status: Abnormal   Collection Time: 11/30/16 11:33 PM  Result Value Ref Range Status   Specimen Description URINE, CATHETERIZED  Final   Special Requests NONE  Final   Culture (A)  Final    >=100,000 COLONIES/mL ESCHERICHIA COLI Confirmed Extended Spectrum Beta-Lactamase Producer (ESBL) Performed at [facility], [address], [place], [place] [address]    Report Status 12/03/2016 FINAL  Final   Organism ID, Bacteria ESCHERICHIA COLI (A)  Final      Susceptibility   Escherichia coli - MIC*    AMPICILLIN >=32 RESISTANT Resistant     CEFAZOLIN >=64 RESISTANT Resistant  CEFTRIAXONE >=64 RESISTANT Resistant     CIPROFLOXACIN >=4 RESISTANT Resistant     GENTAMICIN >=16 RESISTANT Resistant     IMIPENEM <=0.25 SENSITIVE Sensitive     NITROFURANTOIN <=16 SENSITIVE Sensitive     TRIMETH/SULFA >=320 RESISTANT Resistant     AMPICILLIN/SULBACTAM 8 SENSITIVE Sensitive     PIP/TAZO <=4 SENSITIVE Sensitive     Extended ESBL POSITIVE Resistant     * >=100,000 COLONIES/mL ESCHERICHIA COLI  Culture, blood (Routine x 2)     Status: Abnormal   Collection Time: 11/30/16 11:42 PM  Result Value Ref Range Status   Specimen Description BLOOD LEFT WRIST  Final   Special Requests   Final    BOTTLES DRAWN AEROBIC AND ANAEROBIC Blood Culture adequate volume   Culture  Setup Time   Final    GRAM NEGATIVE RODS IN BOTH AEROBIC AND ANAEROBIC BOTTLES CRITICAL RESULT CALLED TO, READ BACK BY AND VERIFIED WITH: [place] 12/01/16 A BROWNING Performed at [facility], [place] [address] [place], [place] [address]    Culture PROTEUS MIRABILIS (A)  Final   Report Status 12/03/2016 FINAL  Final   Organism ID, Bacteria PROTEUS MIRABILIS  Final      Susceptibility   Proteus mirabilis - MIC*    AMPICILLIN <=2 SENSITIVE Sensitive     CEFAZOLIN <=4  SENSITIVE Sensitive     CEFEPIME <=1 SENSITIVE Sensitive     CEFTAZIDIME <=1 SENSITIVE Sensitive     CEFTRIAXONE <=1 SENSITIVE Sensitive     CIPROFLOXACIN <=0.25 SENSITIVE Sensitive     GENTAMICIN <=1 SENSITIVE Sensitive     IMIPENEM 1 SENSITIVE Sensitive     TRIMETH/SULFA <=20 SENSITIVE Sensitive     AMPICILLIN/SULBACTAM <=2 SENSITIVE Sensitive     PIP/TAZO <=4 SENSITIVE Sensitive     * PROTEUS MIRABILIS  Blood Culture ID Panel (Reflexed)     Status: Abnormal   Collection Time: 11/30/16 11:42 PM  Result Value Ref Range Status   Enterococcus species NOT DETECTED NOT DETECTED Final   Listeria monocytogenes NOT DETECTED NOT DETECTED Final   Staphylococcus species NOT DETECTED NOT DETECTED Final   Staphylococcus aureus NOT DETECTED NOT DETECTED Final   Streptococcus species NOT DETECTED NOT DETECTED Final   Streptococcus agalactiae NOT DETECTED NOT DETECTED Final   Streptococcus pneumoniae NOT DETECTED NOT DETECTED Final   Streptococcus pyogenes NOT DETECTED NOT DETECTED Final   Acinetobacter baumannii NOT DETECTED NOT DETECTED Final   Enterobacteriaceae species DETECTED (A) NOT DETECTED Final    Comment: Enterobacteriaceae represent a large family of gram-negative bacteria, not a single organism. CRITICAL RESULT CALLED TO, READ BACK BY AND VERIFIED WITH: [place] Slider PHARMD [id number] 12/01/16 A BROWNING    Enterobacter cloacae complex NOT DETECTED NOT DETECTED Final   Escherichia coli NOT DETECTED NOT DETECTED Final   Klebsiella oxytoca NOT DETECTED NOT DETECTED Final   Klebsiella pneumoniae NOT DETECTED NOT DETECTED Final   Proteus species DETECTED (A) NOT DETECTED Final    Comment: CRITICAL RESULT CALLED TO, READ BACK BY AND VERIFIED WITH: [place] Sl[place] PHARMD [id number] 12/01/16 A BROWNING    Serratia marcescens NOT DETECTED NOT DETECTED Final   Carbapenem resistance NOT DETECTED NOT DETECTED Final   Haemophilus influenzae NOT DETECTED NOT DETECTED Final   Neisseria meningitidis NOT DETECTED  NOT DETECTED Final   Pseudomonas aeruginosa NOT DETECTED NOT DETECTED Final   Candida albicans NOT DETECTED NOT DETECTED Final   Candida glabrata NOT DETECTED NOT DETECTED Final   Candida krusei NOT  DETECTED NOT DETECTED Final   Candida parapsilosis NOT DETECTED NOT DETECTED Final   Candida tropicalis NOT DETECTED NOT DETECTED Final    Comment: Performed at [facility], [place] [address] [place], [place] [address]  Culture, blood (Routine x 2)     Status: Abnormal   Collection Time: 12/01/16 12:23 AM  Result Value Ref Range Status   Specimen Description BLOOD RIGHT HAND  Final   Special Requests IN PEDIATRIC BOTTLE Blood Culture adequate volume  Final   Culture  Setup Time   Final    GRAM NEGATIVE RODS IN PEDIATRIC BOTTLE CRITICAL RESULT CALLED TO, READ BACK BY AND VERIFIED WITH: [place] 12/01/16 A BROWNING    Culture (A)  Final    PROTEUS MIRABILIS SUSCEPTIBILITIES PERFORMED ON PREVIOUS CULTURE WITHIN THE LAST 5 DAYS. Performed at [facility], [place] [address] [place], [place] [address]    Report Status 12/03/2016 FINAL  Final  Culture, blood (routine x 2)     Status: None   Collection Time: 12/02/16 11:18 AM  Result Value Ref Range Status   Specimen Description BLOOD RIGHT ANTECUBITAL  Final   Special Requests IN PEDIATRIC BOTTLE Blood Culture adequate volume  Final   Culture   Final    NO GROWTH 5 DAYS Performed at [facility], [place] [address] [place], [place] [address]    Report Status 12/07/2016 FINAL  Final  Culture, blood (routine x 2)     Status: None   Collection Time: 12/02/16 11:23 AM  Result Value Ref Range Status   Specimen Description BLOOD LEFT HAND  Final   Special Requests IN PEDIATRIC BOTTLE Blood Culture adequate volume  Final   Culture   Final    NO GROWTH 5 DAYS Performed at [facility], [place] [address] [place], [place] [address]    Report Status 12/07/2016 FINAL  Final  C difficile quick scan w PCR reflex      Status: None   Collection Time: 12/02/16  1:25 PM  Result Value Ref Range Status   C Diff antigen NEGATIVE NEGATIVE Final   C Diff toxin NEGATIVE NEGATIVE Final   C Diff interpretation No C. difficile detected.  Final         Radiology Studies: No results found.      Scheduled Meds: . aspirin EC  81 mg Oral Daily  . carvedilol  3.125 mg Oral BID WC  . enoxaparin (LOVENOX) injection  30 mg Subcutaneous Q24H  . famotidine  20 mg Oral QHS  . ferrous sulfate  325 mg Oral Q breakfast  . guaiFENesin  600 mg Oral BID  . insulin aspart  0-15 Units Subcutaneous TID WC  . insulin aspart  0-5 Units Subcutaneous QHS  . insulin glargine  15 Units Subcutaneous Q2200  . ipratropium-albuterol  3 mL Nebulization BID  . sertraline  100 mg Oral QPC breakfast   Continuous Infusions: . meropenem (MERREM) IV 1 g (12/08/16 0917)     LOS: 7 days        Mauricio Gerome Apley, MD [facility] Pager [phone]  If 7PM-7AM, please contact night-coverage [email or web address] Password [email or web address] 12/08/2016, 9:50 AM

## 2016-12-09 DIAGNOSIS — I10 Essential (primary) hypertension: Secondary | ICD-10-CM

## 2016-12-09 DIAGNOSIS — R7881 Bacteremia: Secondary | ICD-10-CM

## 2016-12-09 DIAGNOSIS — N390 Urinary tract infection, site not specified: Secondary | ICD-10-CM

## 2016-12-09 DIAGNOSIS — A4151 Sepsis due to Escherichia coli [E. coli]: Secondary | ICD-10-CM

## 2016-12-09 DIAGNOSIS — I480 Paroxysmal atrial fibrillation: Secondary | ICD-10-CM

## 2016-12-09 DIAGNOSIS — N179 Acute kidney failure, unspecified: Secondary | ICD-10-CM

## 2016-12-09 LAB — BASIC METABOLIC PANEL
Anion gap: 12 (ref 5–15)
BUN: 57 mg/dL — AB (ref 6–20)
CALCIUM: 9.2 mg/dL (ref 8.9–10.3)
CO2: 23 mmol/L (ref 22–32)
CREATININE: 1.5 mg/dL — AB (ref 0.44–1.00)
Chloride: 98 mmol/L — ABNORMAL LOW (ref 101–111)
GFR calc Af Amer: 36 mL/min — ABNORMAL LOW (ref >60)
GFR calc non Af Amer: 31 mL/min — ABNORMAL LOW (ref >60)
GLUCOSE: 119 mg/dL — AB (ref 65–99)
POTASSIUM: 4.1 mmol/L (ref 3.5–5.1)
SODIUM: 133 mmol/L — AB (ref 135–145)

## 2016-12-09 LAB — GLUCOSE, CAPILLARY
GLUCOSE-CAPILLARY: 123 mg/dL — AB (ref 65–99)
Glucose-Capillary: 214 mg/dL — ABNORMAL HIGH (ref 65–99)
Glucose-Capillary: 126 mg/dL — ABNORMAL HIGH (ref 65–99)

## 2016-12-09 MED ORDER — AMOXICILLIN 500 MG PO TABS: 500.0000 mg | ORAL_TABLET | Freq: Three times a day (TID) | ORAL | Status: DC

## 2016-12-09 MED ORDER — INSULIN ASPART 100 UNIT/ML ~~LOC~~ SOLN: 0.0000 [IU] | Freq: Three times a day (TID) | SUBCUTANEOUS | Status: DC

## 2016-12-09 MED ORDER — INSULIN GLARGINE 100 UNIT/ML SOLOSTAR PEN: 15.0000 [IU] | PEN_INJECTOR | Freq: Every day | SUBCUTANEOUS | Status: DC

## 2016-12-09 MED ORDER — CARVEDILOL 3.125 MG PO TABS: 3.1250 mg | ORAL_TABLET | Freq: Two times a day (BID) | ORAL | Status: DC

## 2016-12-09 NOTE — Discharge Instructions (Signed)

## 2016-12-09 NOTE — Progress Notes (Signed)
Physical Therapy Treatment Patient Details Name: Carrie Atkins MRN: [id number] DOB: 05/27/1934 Today's Date: 12/09/2016    History of Present Illness 83 y.o. female with a past medical history significant for CLL, IDDM with neuropathy, HTN, and ESBL UTI and admitted with Severe Sepsis due to E coli UTI and Proteus bacteremia      PT Comments    Pt moved better today than last session.  Con't to be weak and recommend SNF.   Follow Up Recommendations  SNF     Equipment Recommendations  None recommended by PT    Recommendations for Other Services       Precautions / Restrictions Precautions Precautions: Fall Restrictions Weight Bearing Restrictions: No    Mobility  Bed Mobility Overal bed mobility: Needs Assistance Bed Mobility: Supine to Sit     Supine to sit: HOB elevated;Mod assist        Transfers Overall transfer level: Needs assistance Equipment used: Rolling walker (2 wheeled) Transfers: Sit to/from [facility] Sit to Stand: Min assist;+2 physical assistance;+2 safety/equipment Stand pivot transfers: Min assist;+2 physical assistance;+2 safety/equipment       General transfer comment: Pt requires increased time with sit > stand .  Short shuffled steps with SPT with encouragement needed to complete.  Ambulation/Gait             General Gait Details: SPT only   Stairs            Wheelchair Mobility    Modified Rankin (Stroke Patients Only)       Balance           Standing balance support: Bilateral upper extremity supported Standing balance-Leahy Scale: Poor                              Cognition Arousal/Alertness: Awake/alert Behavior During Therapy: WFL for tasks assessed/performed Overall Cognitive Status: Within Functional Limits for tasks assessed                                        Exercises      General Comments        Pertinent Vitals/Pain Pain Assessment:  Faces Faces Pain Scale: Hurts even more Pain Location: back pain Pain Descriptors / Indicators: Grimacing Pain Intervention(s): Monitored during session;Repositioned    Home Living                      Prior Function            PT Goals (current goals can now be found in the care plan section) Acute Rehab PT Goals PT Goal Formulation: With patient Time For Goal Achievement: 12/18/16 Potential to Achieve Goals: Good Progress towards PT goals: Progressing toward goals    Frequency    Min 3X/week      PT Plan Current plan remains appropriate    Co-evaluation              AM-PAC PT "6 Clicks" Daily Activity  Outcome Measure  Difficulty turning over in bed (including adjusting bedclothes, sheets and blankets)?: Total Difficulty moving from lying on back to sitting on the side of the bed? : Total Difficulty sitting down on and standing up from a chair with arms (e.g., wheelchair, bedside commode, etc,.)?: Total Help needed moving to and from a bed to chair (including a  wheelchair)?: A Little Help needed walking in hospital room?: A Lot Help needed climbing 3-5 steps with a railing? : Total 6 Click Score: 9    End of Session Equipment Utilized During Treatment: Gait belt Activity Tolerance: Patient limited by fatigue Patient left: in chair;with call bell/phone within reach;with chair alarm set Nurse Communication: Mobility status PT Visit Diagnosis: Other abnormalities of gait and mobility (R26.89)     Time: 1352-1405 PT Time Calculation (min) (ACUTE ONLY): 13 min  Charges:  $Therapeutic Activity: 8-22 mins                    G Codes:       Janessa Mickle L. Tamala Julian, [place] Pager [phone] 12/09/2016    Galen [place] 12/09/2016, 2:30 PM

## 2016-12-09 NOTE — Discharge Summary (Signed)
Physician Discharge Summary  Murry Khiev Omura WVP:[id number] DOB: 1934/06/08  PCP: [name], MD  Admit date: 11/30/2016 Discharge date: 12/09/2016  Recommendations for Outpatient Follow-up:  1. MD at SNF in 2 days with repeat labs (CBC & BMP). 2. Dr. Horald Pollen, PCP upon discharge from SNF.  Home Health: None Equipment/Devices: None    Discharge Condition: Improved and stable  CODE STATUS: DO NOT RESUSCITATE  Diet recommendation: Heart healthy and diabetic diet.  Discharge Diagnoses:  Principal Problem:   Sepsis ([place]) Active Problems:   HTN (hypertension)   CLL (chronic lymphocytic leukemia) (HCC)   Diabetic polyneuropathy associated with type 2 diabetes mellitus (HCC)   Acute encephalopathy   Paroxysmal atrial fibrillation (HCC)   History of CHF (congestive heart failure)   Lactic acid acidosis   Tachypnea   Wheezing   Acute lower UTI   Bacteremia   Brief Summary: 83 year old female with PMH of CLL, IDDM, HTN, ESBL UTI, presented to the ED with history of dysuria, suprapubic, periumbilical abdominal discomfort, malaise, intermittent confusion, fever and weakness. In the ED, temperature 101.26F, RR 30, creatinine 2 (baseline 0.8), WBC 26.5K, glucose 282, urinalysis suggestive of UTI, lactate 3.55. She was initially admitted for sepsis secondary to UTI.  Assessment and plan:  1. Sepsis: Secondary to ESBL Escherichia coli UTI and Proteus mirabilis bacteremia. Initially treated per sepsis protocol and antibiotics. Antibiotics were then adjusted based on culture sensitivities. Sepsis resolved. 2. ESBL Escherichia coli UTI: Has been on IV meropenem since 6/23. Completed treatment and no antibiotics at discharge for this indication. Discussed with infectious disease M.D. on call who agrees. 3. Proteus mirabilis bacteremia: Unclear source. Possibly UTI. Has been on several antibiotics since admission, Zosyn >Unasyn >meropenem. Surveillance blood cultures 2 negative.  Discussed with infectious disease M.D. on call and okay to transitioning to oral amoxicillin. Plan to complete total 14 days antibiotics from date of negative blood cultures 6/20 4. Acute kidney injury: Baseline creatinine 0.8. Presented with creatinine of 2. Initially secondary to to sepsis and dehydration. Treated with IV fluids and for sepsis. Renal ultrasound showed mild right hydronephrosis. Post void bladder scans were stable. Creatinine had come down from 2-1.4 range. However after couple days of aggressive diuresis, creatinine bumped up to 1.8. Lasix discontinued. Creatinine has improved to 1.5. Follow BMP closely as outpatient. May consider urology consultation regarding mild hydronephrosis. 5. Hyponatremia: Had resolved. Mild hyponatremia 133 today. Follow BMP periodically. 6. Normocytic anemia: Stable. Baseline creatinine between 8-9. 7. Insulin-dependent DM2: Mildly uncontrolled and fluctuating in the hospital. She was treated with Lantus and SSI. Oral meds were held. A1c 12.6 on 4/18. Continue to hold oral hypoglycemics at discharge due to renal insufficiency. Also discharging on lower than home dose of Lantus and on NovoLog rather than Humalog quick pen. Monitor closely at SNF, medications may need adjustment and can revert to home medications when able. 8. Essential hypertension: Reasonable inpatient control on reduced dose of carvedilol. Titrate up carvedilol dose as needed at SNF. 9. A. fib: CHADS2-VASc at least 5. Not on anticoagulation, unclear reason but can be followed up as outpatient by PCP or primary cardiologist (suspected due to fall risk). Continue carvedilol and aspirin 10. Acute on chronic Chronic diastolic CHF: 2-D echo 10/16/60: LVEF 60-65 percent and grade 1 diastolic dysfunction. After IV fluids for sepsis, she developed dyspnea and wheezing, chest x-ray showed mild CHF and she received a couple of days of Lasix 40 mg IV twice a day. Clinically now euvolemic. Creatinine again  worsened  from 1.4-1.8. Lasix discontinued 4/26. Creatinine has improved to 1.5 today. Closely follow with repeat BMP in 48 hours and if creatinine improving, may consider resuming low-dose Lasix. 11. Depression: Continue sertraline. 12. GERD: Asymptomatic. Continue H2 blocker. 13. Diarrhea: Self-limiting. C. difficile testing negative. 14. CLL: WBC count stable. Outpatient follow-up. 15. Confusion: Likely related to sepsis. Improved and mental status may be back to baseline.   Consultations:  None  Procedures:  None   Discharge Instructions  Discharge Instructions    (HEART FAILURE PATIENTS) Call MD:  Anytime you have any of the following symptoms: 1) 3 pound weight gain in 24 hours or 5 pounds in 1 week 2) shortness of breath, with or without a dry hacking cough 3) swelling in the hands, feet or stomach 4) if you have to sleep on extra pillows at night in order to breathe.    Complete by:  As directed    Call MD for:  difficulty breathing, headache or visual disturbances    Complete by:  As directed    Call MD for:  extreme fatigue    Complete by:  As directed    Call MD for:  persistant dizziness or light-headedness    Complete by:  As directed    Call MD for:  temperature >100.4    Complete by:  As directed    Diet - low sodium heart healthy    Complete by:  As directed    Diet Carb Modified    Complete by:  As directed    Increase activity slowly    Complete by:  As directed        Medication List    STOP taking these medications   furosemide 40 MG tablet Commonly known as:  LASIX   glimepiride 4 MG tablet Commonly known as:  AMARYL   HUMALOG KWIKPEN 100 UNIT/ML KiwkPen Generic drug:  insulin lispro     TAKE these medications   amoxicillin 500 MG tablet Commonly known as:  AMOXIL Take 1 tablet (500 mg total) by mouth 3 (three) times daily. Discontinue after 12/15/16 doses.   aspirin EC 81 MG tablet Take 81 mg by mouth daily.   CALCIUM-MAGNESIUM-ZINC  PO Take 2 tablets by mouth 2 (two) times daily.   carvedilol 3.125 MG tablet Commonly known as:  COREG Take 1 tablet (3.125 mg total) by mouth 2 (two) times daily with a meal. What changed:  medication strength  how much to take  how to take this  when to take this  additional instructions   ferrous sulfate 325 (65 FE) MG tablet Take 325 mg by mouth daily with breakfast.   insulin aspart 100 UNIT/ML injection Commonly known as:  novoLOG Inject 0-15 Units into the skin 3 (three) times daily with meals. CBG < 70: implement hypoglycemia protocol CBG 70 - 120: 0 units CBG 121 - 150: 2 units CBG 151 - 200: 3 units CBG 201 - 250: 5 units CBG 251 - 300: 8 units CBG 301 - 350: 11 units CBG 351 - 400: 15 units CBG > 400: call MD.   Insulin Glargine 100 UNIT/ML Solostar Pen Commonly known as:  LANTUS Inject 15 Units into the skin daily at 10 pm. What changed:  how much to take   ranitidine 150 MG tablet Commonly known as:  ZANTAC Take 150 mg by mouth 2 (two) times daily as needed for heartburn.   sertraline 100 MG tablet Commonly known as:  ZOLOFT Take 100 mg by mouth daily  after breakfast.   TYLENOL ARTHRITIS PAIN 650 MG CR tablet Generic drug:  acetaminophen Take 1,300 mg by mouth 2 (two) times daily.       Contact information for follow-up providers    M.D. at SNF. Schedule an appointment as soon as possible for a visit in 2 day(s).   Why:  To be seen with repeat labs (CBC & BMP). May consider outpatient urology consultation for further evaluation of mild right hydronephrosis seen on renal ultrasound.       [name], MD. Schedule an appointment as soon as possible for a visit.   Specialty:  Family Medicine Why:  Upon discharge from SNF. Contact information: [place]  [id number] [phone]            Contact information for after-discharge care    Destination    [facility] Follow up.   Specialty:  Skilled  Nursing Facility Contact information: [place] [phone]                 Allergies  Allergen Reactions  . Sulfonamide Derivatives Nausea And Vomiting  . Hydrocodone Nausea And Vomiting  . Sulfa Antibiotics Nausea And Vomiting      Procedures/Studies: Dg Chest 2 View  Result Date: 12/05/2016 CLINICAL DATA:  Shortness of breath today. EXAM: CHEST  2 VIEW COMPARISON:  12/02/2016 and 09/17/2016 FINDINGS: Lungs are adequately inflated without focal consolidation or effusion. Mild stable cardiomegaly. Remainder of the exam is unchanged. IMPRESSION: No acute cardiopulmonary disease. Stable cardiomegaly. Electronically Signed   By: Marin Olp M.D.   On: 12/05/2016 12:41   Dg Chest 2 View  Result Date: 01-Sep-202018 CLINICAL DATA:  83 year old female with shortness of breath. EXAM: CHEST  2 VIEW COMPARISON:  Chest radiograph dated 09/17/2016 FINDINGS: There is shallow inspiration with minimal bibasilar atelectatic changes. No focal consolidation, pleural effusion, or pneumothorax. Top-normal cardiac size. No acute osseous pathology. IMPRESSION: Shallow inspiration.  No focal consolidation. Electronically Signed   By: Anner Crete M.D.   On: 001-Sep-202018 00:24   US Renal  Result Date: 12/02/2016 CLINICAL DATA:  Acute kidney injury EXAM: RENAL / URINARY TRACT ULTRASOUND COMPLETE COMPARISON:  None. FINDINGS: Right Kidney: Length: Atrophic, 8.7 cm. Lower pole stone measures 5 mm. Mild right hydronephrosis. Normal echotexture. Left Kidney: Length: 9.3 cm. Normal echotexture. No hydronephrosis. Midpole cyst measures up to 4 cm. Bladder: Appears normal for degree of bladder distention. IMPRESSION: Mildly atrophic right kidney with 5 mm lower pole stone. Mild right hydronephrosis noted. Electronically Signed   By: Rolm Baptise M.D.   On: 12/02/2016 18:47   Dg Chest Port 1 View  Result Date: 12/02/2016 CLINICAL DATA:  Increased wheezing and  shortness of breath. Tachypnea. History CHF. EXAM: PORTABLE CHEST 1 VIEW COMPARISON:  Frontal and lateral views 001-Sep-202018 FINDINGS: The heart is enlarged. There is developing vascular congestion. Minimal perihilar pulmonary edema suspected. No large pleural effusion, focal consolidation, or pneumothorax. Stable osseous structures. IMPRESSION: Cardiomegaly with developing vascular congestion and mild perihilar edema, consistent with mild CHF. Electronically Signed   By: Jeb Levering M.D.   On: 12/02/2016 06:54      Subjective: States that she feels much better than on admission. Denies dysuria or urinary frequency. Denies dyspnea, chest pain. No dizziness or lightheadedness reported.  Discharge Exam:  Vitals:   12/08/16 2006 12/08/16 2126 12/09/16 0527 12/09/16 1018  BP:  (!) 130/93 128/70   Pulse:  70 91  Resp:  18 20   Temp:  99.2 F (37.3 C) 98.6 F (37 C)   TempSrc:  Oral Oral   SpO2: 98% 95% 94% 99%  Weight:   83 kg (182 lb 15.7 oz)   Height:        General: Pt lying comfortably in bed & appears in no obvious distress. Cardiovascular: S1 & S2 heard, RRR, S1/S2 +. No murmurs, rubs, gallops or clicks. No JVD or pedal edema. Respiratory: Clear to auscultation without wheezing, rhonchi or crackles. No increased work of breathing. Abdominal:  Non distended, non tender & soft. No organomegaly or masses appreciated. Normal bowel sounds heard. CNS: Alert and oriented 2. No focal deficits. Extremities: no edema, no cyanosis    The results of significant diagnostics from this hospitalization (including imaging, microbiology, ancillary and laboratory) are listed below for reference.     Microbiology: Recent Results (from the past 240 hour(s))  Urine culture     Status: Abnormal   Collection Time: 11/30/16 11:33 PM  Result Value Ref Range Status   Specimen Description URINE, CATHETERIZED  Final   Special Requests NONE  Final   Culture (A)  Final    >=100,000 COLONIES/mL  ESCHERICHIA COLI Confirmed Extended Spectrum Beta-Lactamase Producer (ESBL) Performed at [facility], [address], [place], [place] [address]    Report Status 12/03/2016 FINAL  Final   Organism ID, Bacteria ESCHERICHIA COLI (A)  Final      Susceptibility   Escherichia coli - MIC*    AMPICILLIN >=32 RESISTANT Resistant     CEFAZOLIN >=64 RESISTANT Resistant     CEFTRIAXONE >=64 RESISTANT Resistant     CIPROFLOXACIN >=4 RESISTANT Resistant     GENTAMICIN >=16 RESISTANT Resistant     IMIPENEM <=0.25 SENSITIVE Sensitive     NITROFURANTOIN <=16 SENSITIVE Sensitive     TRIMETH/SULFA >=320 RESISTANT Resistant     AMPICILLIN/SULBACTAM 8 SENSITIVE Sensitive     PIP/TAZO <=4 SENSITIVE Sensitive     Extended ESBL POSITIVE Resistant     * >=100,000 COLONIES/mL ESCHERICHIA COLI  Culture, blood (Routine x 2)     Status: Abnormal   Collection Time: 11/30/16 11:42 PM  Result Value Ref Range Status   Specimen Description BLOOD LEFT WRIST  Final   Special Requests   Final    BOTTLES DRAWN AEROBIC AND ANAEROBIC Blood Culture adequate volume   Culture  Setup Time   Final    GRAM NEGATIVE RODS IN BOTH AEROBIC AND ANAEROBIC BOTTLES CRITICAL RESULT CALLED TO, READ BACK BY AND VERIFIED WITH: [place] 12/01/16 A BROWNING Performed at [facility], [address] [place], [place] [address]    Culture PROTEUS MIRABILIS (A)  Final   Report Status 12/03/2016 FINAL  Final   Organism ID, Bacteria PROTEUS MIRABILIS  Final      Susceptibility   Proteus mirabilis - MIC*    AMPICILLIN <=2 SENSITIVE Sensitive     CEFAZOLIN <=4 SENSITIVE Sensitive     CEFEPIME <=1 SENSITIVE Sensitive     CEFTAZIDIME <=1 SENSITIVE Sensitive     CEFTRIAXONE <=1 SENSITIVE Sensitive     CIPROFLOXACIN <=0.25 SENSITIVE Sensitive     GENTAMICIN <=1 SENSITIVE Sensitive     IMIPENEM 1 SENSITIVE Sensitive     TRIMETH/SULFA <=20 SENSITIVE Sensitive     AMPICILLIN/SULBACTAM <=2 SENSITIVE Sensitive      PIP/TAZO <=4 SENSITIVE Sensitive     * PROTEUS MIRABILIS  Blood Culture ID Panel (Reflexed)  Status: Abnormal   Collection Time: 11/30/16 11:42 PM  Result Value Ref Range Status   Enterococcus species NOT DETECTED NOT DETECTED Final   Listeria monocytogenes NOT DETECTED NOT DETECTED Final   Staphylococcus species NOT DETECTED NOT DETECTED Final   Staphylococcus aureus NOT DETECTED NOT DETECTED Final   Streptococcus species NOT DETECTED NOT DETECTED Final   Streptococcus agalactiae NOT DETECTED NOT DETECTED Final   Streptococcus pneumoniae NOT DETECTED NOT DETECTED Final   Streptococcus pyogenes NOT DETECTED NOT DETECTED Final   Acinetobacter baumannii NOT DETECTED NOT DETECTED Final   Enterobacteriaceae species DETECTED (A) NOT DETECTED Final    Comment: Enterobacteriaceae represent a large family of gram-negative bacteria, not a single organism. CRITICAL RESULT CALLED TO, READ BACK BY AND VERIFIED WITH: Sheffield Slider PHARMD [id number] 12/01/16 A BROWNING    Enterobacter cloacae complex NOT DETECTED NOT DETECTED Final   Escherichia coli NOT DETECTED NOT DETECTED Final   Klebsiella oxytoca NOT DETECTED NOT DETECTED Final   Klebsiella pneumoniae NOT DETECTED NOT DETECTED Final   Proteus species DETECTED (A) NOT DETECTED Final    Comment: CRITICAL RESULT CALLED TO, READ BACK BY AND VERIFIED WITH: Sheffield Slider PHARMD [id number] 12/01/16 A BROWNING    Serratia marcescens NOT DETECTED NOT DETECTED Final   Carbapenem resistance NOT DETECTED NOT DETECTED Final   Haemophilus influenzae NOT DETECTED NOT DETECTED Final   Neisseria meningitidis NOT DETECTED NOT DETECTED Final   Pseudomonas aeruginosa NOT DETECTED NOT DETECTED Final   Candida albicans NOT DETECTED NOT DETECTED Final   Candida glabrata NOT DETECTED NOT DETECTED Final   Candida krusei NOT DETECTED NOT DETECTED Final   Candida parapsilosis NOT DETECTED NOT DETECTED Final   Candida tropicalis NOT DETECTED NOT DETECTED Final    Comment:  Performed at [facility], [place] [address] [place], [place] [address]  Culture, blood (Routine x 2)     Status: Abnormal   Collection Time: 12/01/16 12:23 AM  Result Value Ref Range Status   Specimen Description BLOOD RIGHT HAND  Final   Special Requests IN PEDIATRIC BOTTLE Blood Culture adequate volume  Final   Culture  Setup Time   Final    GRAM NEGATIVE RODS IN PEDIATRIC BOTTLE CRITICAL RESULT CALLED TO, READ BACK BY AND VERIFIED WITH: [place] 12/01/16 A BROWNING    Culture (A)  Final    PROTEUS MIRABILIS SUSCEPTIBILITIES PERFORMED ON PREVIOUS CULTURE WITHIN THE LAST 5 DAYS. Performed at [facility], [place] [address] [place], [place] [address]    Report Status 12/03/2016 FINAL  Final  Culture, blood (routine x 2)     Status: None   Collection Time: 12/02/16 11:18 AM  Result Value Ref Range Status   Specimen Description BLOOD RIGHT ANTECUBITAL  Final   Special Requests IN PEDIATRIC BOTTLE Blood Culture adequate volume  Final   Culture   Final    NO GROWTH 5 DAYS Performed at [facility], [place] [address], [place], [place] [address]    Report Status 12/07/2016 FINAL  Final  Culture, blood (routine x 2)     Status: None   Collection Time: 12/02/16 11:23 AM  Result Value Ref Range Status   Specimen Description BLOOD LEFT HAND  Final   Special Requests IN PEDIATRIC BOTTLE Blood Culture adequate volume  Final   Culture   Final    NO GROWTH 5 DAYS Performed at [facility], [place] [address] [place], [place] [address]    Report  Status 12/07/2016 FINAL  Final  C difficile quick scan w PCR reflex     Status: None   Collection Time: 12/02/16  1:25 PM  Result Value Ref Range Status   C Diff antigen NEGATIVE NEGATIVE Final   C Diff toxin NEGATIVE NEGATIVE Final   C Diff interpretation No C. difficile detected.  Final     Labs: CBC:  Recent Labs Lab 12/03/16 0547 12/04/16 0507 12/05/16 0519 12/06/16 0526  WBC 16.3* 15.8* 15.9* 16.1*   HGB 9.4* 9.6* 9.7* 10.4*  HCT 28.8* 28.7* 28.6* 31.0*  MCV 91.4 93.2 93.8 92.5  PLT 106* 111* 145* 321   Basic Metabolic Panel:  Recent Labs Lab 12/03/16 0547 12/04/16 0507 12/05/16 0519 12/06/16 0526 12/08/16 0456 12/09/16 0454  NA 136 139 137 137  --  133*  K 3.6 3.4* 4.2 4.2  --  4.1  CL 107 107 107 104  --  98*  CO2 20* 20* 20* 22  --  23  GLUCOSE 65 83 90 106*  --  119*  BUN 39* 47* 43* 44*  --  57*  CREATININE 1.80* 1.70* 1.49* 1.45* 1.81* 1.50*  CALCIUM 8.2* 8.5* 8.8* 9.0  --  9.2   BNP (last 3 results)  Recent Labs  12/04/16 1157  BNP 911.6*   CBG:  Recent Labs Lab 12/08/16 1228 12/08/16 1737 12/08/16 2125 12/09/16 0724 12/09/16 1200  GLUCAP 237* 133* 211* 123* 214*   Urinalysis    Component Value Date/Time   COLORURINE YELLOW 11/30/2016 2333   APPEARANCEUR HAZY (A) 11/30/2016 2333   LABSPEC 1.020 11/30/2016 2333   PHURINE 5.0 11/30/2016 2333   GLUCOSEU NEGATIVE 11/30/2016 2333   HGBUR MODERATE (A) 11/30/2016 2333   BILIRUBINUR NEGATIVE 11/30/2016 2333   KETONESUR NEGATIVE 11/30/2016 2333   PROTEINUR NEGATIVE 11/30/2016 2333   NITRITE NEGATIVE 11/30/2016 2333   LEUKOCYTESUR LARGE (A) 11/30/2016 2333   Discussed in detail with patient's daughter. Updated care and answered questions.   Time coordinating discharge: Over 30 minutes  SIGNED:  Vernell Leep, MD, FACP, [place]. [facility] Pager [phone]  If 7PM-7AM, please contact night-coverage [email or web address] Password [facility] 12/09/2016, 2:29 PM

## 2016-12-09 NOTE — Progress Notes (Signed)
Report given to [facility].

## 2016-12-09 NOTE — Care Management Note (Signed)
Case Management Note  Patient Details  Name: MAHSA HANSER MRN: [id number] Date of Birth: 1934-06-03  Subjective/Objective:                    Action/Plan:   Expected Discharge Date:  12/09/16               Expected Discharge Plan:  Skilled Nursing Facility  In-House Referral:  Clinical Social Work  Discharge planning Services  CM Consult  Post Acute Care Choice:    Choice offered to:     DME Arranged:    DME Agency:     HH Arranged:    [place] Agency:     Status of Service:  Completed, signed off  If discussed at [employer], dates discussed:    Additional CommentsPurcell Mouton, RN 12/09/2016, 11:16 AM

## 2016-12-09 NOTE — Clinical Social Work Placement (Signed)
Patient received and accepted bed offer at [facility]. [facility] contacted, patient's family aware. Patient's RN can call report to [phone], patient going to room 208.  CLINICAL SOCIAL WORK PLACEMENT  NOTE  Date:  12/09/2016  Patient Details  Name: Carrie Atkins MRN: [id number] Date of Birth: Dec 08, 1933  Clinical Social Work is seeking post-discharge placement for this patient at the [place] level of care (*CSW will initial, date and re-position this form in  chart as items are completed):  Yes   Patient/family provided with [facility] Work Department's list of facilities offering this level of care within the geographic area requested by the patient (or if unable, by the patient's family).  Yes   Patient/family informed of their freedom to choose among providers that offer the needed level of care, that participate in Medicare, Medicaid or managed care program needed by the patient, have an available bed and are willing to accept the patient.  Yes   Patient/family informed of [facility]'s ownership interest in [facility] and [facility], as well as of the fact that they are under no obligation to receive care at these facilities.  PASRR submitted to EDS on       PASRR number received on       Existing PASRR number confirmed on 12/04/16     FL2 transmitted to all facilities in geographic area requested by pt/family on 12/04/16     FL2 transmitted to all facilities within larger geographic area on       Patient informed that his/her managed care company has contracts with or will negotiate with certain facilities, including the following:        Yes   Patient/family informed of bed offers received.  Patient chooses bed at [place] [place]     Physician recommends and patient chooses bed at      Patient to be transferred to [place], [place] on 12/09/16.  Patient to be transferred to facility by [facility]     Patient family  notified on 12/09/16 of transfer.  Name of family member notified:  Hedy Jacob     PHYSICIAN       Additional Comment:    _______________________________________________ Burnis Medin, LCSW 12/09/2016, 3:22 PM

## 2016-12-25 ENCOUNTER — Other Ambulatory Visit: Payer: Self-pay | Admitting: Cardiovascular Disease

## 2017-01-25 ENCOUNTER — Other Ambulatory Visit: Payer: Self-pay | Admitting: Cardiovascular Disease

## 2017-01-31 ENCOUNTER — Encounter (HOSPITAL_COMMUNITY): Payer: Self-pay | Admitting: Emergency Medicine

## 2017-01-31 ENCOUNTER — Inpatient Hospital Stay (HOSPITAL_COMMUNITY)
Admission: EM | Admit: 2017-01-31 | Discharge: 2017-02-04 | DRG: 637 | Disposition: A | Discharge status: Home Health | Payer: Medicare Other | Attending: Internal Medicine | Admitting: Internal Medicine

## 2017-01-31 ENCOUNTER — Emergency Department (HOSPITAL_COMMUNITY): Payer: Medicare Other

## 2017-01-31 DIAGNOSIS — D509 Iron deficiency anemia, unspecified: Secondary | ICD-10-CM | POA: Diagnosis present

## 2017-01-31 DIAGNOSIS — I10 Essential (primary) hypertension: Secondary | ICD-10-CM | POA: Diagnosis not present

## 2017-01-31 DIAGNOSIS — Z801 Family history of malignant neoplasm of trachea, bronchus and lung: Secondary | ICD-10-CM

## 2017-01-31 DIAGNOSIS — N183 Chronic kidney disease, stage 3 unspecified: Secondary | ICD-10-CM | POA: Diagnosis present

## 2017-01-31 DIAGNOSIS — E785 Hyperlipidemia, unspecified: Secondary | ICD-10-CM | POA: Diagnosis present

## 2017-01-31 DIAGNOSIS — Z794 Long term (current) use of insulin: Secondary | ICD-10-CM

## 2017-01-31 DIAGNOSIS — Z825 Family history of asthma and other chronic lower respiratory diseases: Secondary | ICD-10-CM

## 2017-01-31 DIAGNOSIS — Y95 Nosocomial condition: Secondary | ICD-10-CM | POA: Diagnosis present

## 2017-01-31 DIAGNOSIS — Z8619 Personal history of other infectious and parasitic diseases: Secondary | ICD-10-CM

## 2017-01-31 DIAGNOSIS — Z1612 Extended spectrum beta lactamase (ESBL) resistance: Secondary | ICD-10-CM | POA: Diagnosis not present

## 2017-01-31 DIAGNOSIS — E871 Hypo-osmolality and hyponatremia: Secondary | ICD-10-CM | POA: Diagnosis present

## 2017-01-31 DIAGNOSIS — Z87440 Personal history of urinary (tract) infections: Secondary | ICD-10-CM | POA: Diagnosis not present

## 2017-01-31 DIAGNOSIS — J439 Emphysema, unspecified: Secondary | ICD-10-CM | POA: Diagnosis present

## 2017-01-31 DIAGNOSIS — C9110 Chronic lymphocytic leukemia of B-cell type not having achieved remission: Secondary | ICD-10-CM | POA: Diagnosis present

## 2017-01-31 DIAGNOSIS — E1122 Type 2 diabetes mellitus with diabetic chronic kidney disease: Secondary | ICD-10-CM | POA: Diagnosis present

## 2017-01-31 DIAGNOSIS — E1165 Type 2 diabetes mellitus with hyperglycemia: Principal | ICD-10-CM | POA: Diagnosis present

## 2017-01-31 DIAGNOSIS — J189 Pneumonia, unspecified organism: Secondary | ICD-10-CM | POA: Diagnosis present

## 2017-01-31 DIAGNOSIS — Z9181 History of falling: Secondary | ICD-10-CM

## 2017-01-31 DIAGNOSIS — A499 Bacterial infection, unspecified: Secondary | ICD-10-CM | POA: Diagnosis not present

## 2017-01-31 DIAGNOSIS — F419 Anxiety disorder, unspecified: Secondary | ICD-10-CM | POA: Diagnosis present

## 2017-01-31 DIAGNOSIS — R410 Disorientation, unspecified: Secondary | ICD-10-CM

## 2017-01-31 DIAGNOSIS — Z66 Do not resuscitate: Secondary | ICD-10-CM | POA: Diagnosis present

## 2017-01-31 DIAGNOSIS — I480 Paroxysmal atrial fibrillation: Secondary | ICD-10-CM | POA: Diagnosis present

## 2017-01-31 DIAGNOSIS — Z96659 Presence of unspecified artificial knee joint: Secondary | ICD-10-CM | POA: Diagnosis present

## 2017-01-31 DIAGNOSIS — N3000 Acute cystitis without hematuria: Secondary | ICD-10-CM | POA: Diagnosis present

## 2017-01-31 DIAGNOSIS — F329 Major depressive disorder, single episode, unspecified: Secondary | ICD-10-CM | POA: Diagnosis present

## 2017-01-31 DIAGNOSIS — W19XXXA Unspecified fall, initial encounter: Secondary | ICD-10-CM | POA: Diagnosis present

## 2017-01-31 DIAGNOSIS — Z823 Family history of stroke: Secondary | ICD-10-CM

## 2017-01-31 DIAGNOSIS — E119 Type 2 diabetes mellitus without complications: Secondary | ICD-10-CM | POA: Diagnosis not present

## 2017-01-31 DIAGNOSIS — Z79890 Hormone replacement therapy (postmenopausal): Secondary | ICD-10-CM

## 2017-01-31 DIAGNOSIS — R631 Polydipsia: Secondary | ICD-10-CM | POA: Diagnosis present

## 2017-01-31 DIAGNOSIS — Z9049 Acquired absence of other specified parts of digestive tract: Secondary | ICD-10-CM

## 2017-01-31 DIAGNOSIS — R0682 Tachypnea, not elsewhere classified: Secondary | ICD-10-CM | POA: Diagnosis present

## 2017-01-31 DIAGNOSIS — C9190 Lymphoid leukemia, unspecified not having achieved remission: Secondary | ICD-10-CM | POA: Diagnosis not present

## 2017-01-31 DIAGNOSIS — Z885 Allergy status to narcotic agent status: Secondary | ICD-10-CM

## 2017-01-31 DIAGNOSIS — Z882 Allergy status to sulfonamides status: Secondary | ICD-10-CM

## 2017-01-31 DIAGNOSIS — R739 Hyperglycemia, unspecified: Secondary | ICD-10-CM | POA: Diagnosis not present

## 2017-01-31 DIAGNOSIS — R1312 Dysphagia, oropharyngeal phase: Secondary | ICD-10-CM | POA: Diagnosis present

## 2017-01-31 DIAGNOSIS — Z7982 Long term (current) use of aspirin: Secondary | ICD-10-CM

## 2017-01-31 DIAGNOSIS — Z79899 Other long term (current) drug therapy: Secondary | ICD-10-CM

## 2017-01-31 DIAGNOSIS — Z6835 Body mass index (BMI) 35.0-35.9, adult: Secondary | ICD-10-CM

## 2017-01-31 DIAGNOSIS — N390 Urinary tract infection, site not specified: Secondary | ICD-10-CM | POA: Diagnosis present

## 2017-01-31 DIAGNOSIS — E669 Obesity, unspecified: Secondary | ICD-10-CM | POA: Diagnosis present

## 2017-01-31 LAB — GLUCOSE, CAPILLARY: Glucose-Capillary: 341 mg/dL — ABNORMAL HIGH (ref 65–99)

## 2017-01-31 LAB — URINALYSIS, ROUTINE W REFLEX MICROSCOPIC
BILIRUBIN URINE: NEGATIVE
Glucose, UA: >=500 mg/dL — AB
Hgb urine dipstick: NEGATIVE
KETONES UR: NEGATIVE mg/dL
NITRITE: NEGATIVE
PH: 6 (ref 5.0–8.0)
Protein, ur: NEGATIVE mg/dL
SPECIFIC GRAVITY, URINE: 1.015 (ref 1.005–1.030)

## 2017-01-31 LAB — CBG MONITORING, ED
Glucose-Capillary: 493 mg/dL — ABNORMAL HIGH (ref 65–99)
Glucose-Capillary: 296 mg/dL — ABNORMAL HIGH (ref 65–99)

## 2017-01-31 LAB — COMPREHENSIVE METABOLIC PANEL
ALBUMIN: 2.5 g/dL — AB (ref 3.5–5.0)
ALK PHOS: 49 U/L (ref 38–126)
ALT: 11 U/L — ABNORMAL LOW (ref 14–54)
ANION GAP: 7 (ref 5–15)
AST: 17 U/L (ref 15–41)
BILIRUBIN TOTAL: 0.7 mg/dL (ref 0.3–1.2)
BUN: 34 mg/dL — AB (ref 6–20)
CO2: 19 mmol/L — AB (ref 22–32)
Calcium: 7.4 mg/dL — ABNORMAL LOW (ref 8.9–10.3)
Chloride: 106 mmol/L (ref 101–111)
Creatinine, Ser: 1.38 mg/dL — ABNORMAL HIGH (ref 0.44–1.00)
GFR calc Af Amer: 40 mL/min — ABNORMAL LOW (ref >60)
GFR calc non Af Amer: 35 mL/min — ABNORMAL LOW (ref >60)
GLUCOSE: 405 mg/dL — AB (ref 65–99)
POTASSIUM: 3.7 mmol/L (ref 3.5–5.1)
SODIUM: 132 mmol/L — AB (ref 135–145)
TOTAL PROTEIN: 5.7 g/dL — AB (ref 6.5–8.1)

## 2017-01-31 LAB — BLOOD GAS, VENOUS
ACID-BASE DEFICIT: 1 mmol/L (ref 0.0–2.0)
BICARBONATE: 23.7 mmol/L (ref 20.0–28.0)
O2 SAT: 46.1 %
PCO2 VEN: 42.3 mmHg — AB (ref 44.0–60.0)
Patient temperature: 98.6
pH, Ven: 7.367 (ref 7.250–7.430)

## 2017-01-31 LAB — CBC
HEMATOCRIT: 29.2 % — AB (ref 36.0–46.0)
HEMOGLOBIN: 9.8 g/dL — AB (ref 12.0–15.0)
MCH: 30.4 pg (ref 26.0–34.0)
MCHC: 33.6 g/dL (ref 30.0–36.0)
MCV: 90.7 fL (ref 78.0–100.0)
Platelets: 199 10*3/uL (ref 150–400)
RBC: 3.22 MIL/uL — ABNORMAL LOW (ref 3.87–5.11)
RDW: 15 % (ref 11.5–15.5)
WBC: 24.4 10*3/uL — ABNORMAL HIGH (ref 4.0–10.5)

## 2017-01-31 LAB — I-STAT CHEM 8, ED
BUN: 30 mg/dL — AB (ref 6–20)
CREATININE: 1 mg/dL (ref 0.44–1.00)
Calcium, Ion: 0.92 mmol/L — ABNORMAL LOW (ref 1.15–1.40)
Chloride: 107 mmol/L (ref 101–111)
Glucose, Bld: 364 mg/dL — ABNORMAL HIGH (ref 65–99)
HEMATOCRIT: 19 % — AB (ref 36.0–46.0)
HEMOGLOBIN: 6.5 g/dL — AB (ref 12.0–15.0)
POTASSIUM: 3.3 mmol/L — AB (ref 3.5–5.1)
Sodium: 137 mmol/L (ref 135–145)
TCO2: 17 mmol/L (ref 0–100)

## 2017-01-31 LAB — BETA-HYDROXYBUTYRIC ACID: Beta-Hydroxybutyric Acid: 0.18 mmol/L (ref 0.05–0.27)

## 2017-01-31 LAB — LIPASE, BLOOD: Lipase: 28 U/L (ref 11–51)

## 2017-01-31 MED ORDER — SODIUM CHLORIDE 0.9 % IV SOLN
INTRAVENOUS | Status: DC
  Administered 2017-01-31: 18:00:00 via INTRAVENOUS

## 2017-01-31 MED ORDER — BUPROPION HCL ER (SR) 100 MG PO TB12
100.0000 mg | ORAL_TABLET | Freq: Every day | ORAL | Status: DC
  Administered 2017-02-01 – 2017-02-04 (×4): 100 mg via ORAL
  Filled 2017-01-31 (×4): qty 1

## 2017-01-31 MED ORDER — CARVEDILOL 25 MG PO TABS: 25.0000 mg | ORAL_TABLET | Freq: Two times a day (BID) | ORAL | Status: DC

## 2017-01-31 MED ORDER — HYDROCODONE-ACETAMINOPHEN 5-325 MG PO TABS: 1.0000 | ORAL_TABLET | ORAL | Status: DC | PRN

## 2017-01-31 MED ORDER — VANCOMYCIN HCL IN DEXTROSE 1-5 GM/200ML-% IV SOLN
1000.0000 mg | Freq: Once | INTRAVENOUS | Status: AC
  Administered 2017-01-31: 1000 mg via INTRAVENOUS
  Filled 2017-01-31: qty 200

## 2017-01-31 MED ORDER — SODIUM CHLORIDE 0.9 % IV BOLUS (SEPSIS)
1000.0000 mL | Freq: Once | INTRAVENOUS | Status: AC
  Administered 2017-01-31: 1000 mL via INTRAVENOUS

## 2017-01-31 MED ORDER — ADULT MULTIVITAMIN W/MINERALS CH
1.0000 | ORAL_TABLET | Freq: Two times a day (BID) | ORAL | Status: DC
  Administered 2017-02-01 – 2017-02-04 (×7): 1 via ORAL
  Filled 2017-01-31 (×8): qty 1

## 2017-01-31 MED ORDER — SENNOSIDES-DOCUSATE SODIUM 8.6-50 MG PO TABS: 1.0000 | ORAL_TABLET | Freq: Every evening | ORAL | Status: DC | PRN

## 2017-01-31 MED ORDER — DEXTROSE 5 % IV SOLN
1.0000 g | Freq: Once | INTRAVENOUS | Status: AC
  Administered 2017-01-31: 1 g via INTRAVENOUS
  Filled 2017-01-31: qty 10

## 2017-01-31 MED ORDER — ONDANSETRON HCL 4 MG PO TABS: 4.0000 mg | ORAL_TABLET | Freq: Four times a day (QID) | ORAL | Status: DC | PRN

## 2017-01-31 MED ORDER — INSULIN GLARGINE 100 UNIT/ML ~~LOC~~ SOLN
17.0000 [IU] | Freq: Every day | SUBCUTANEOUS | Status: DC
  Administered 2017-01-31: 17 [IU] via SUBCUTANEOUS
  Filled 2017-01-31: qty 0.17

## 2017-01-31 MED ORDER — INSULIN ASPART 100 UNIT/ML ~~LOC~~ SOLN
6.0000 [IU] | Freq: Once | SUBCUTANEOUS | Status: AC
  Administered 2017-01-31: 6 [IU] via SUBCUTANEOUS
  Filled 2017-01-31: qty 1

## 2017-01-31 MED ORDER — SODIUM CHLORIDE 0.9 % IV SOLN
1.0000 g | Freq: Two times a day (BID) | INTRAVENOUS | Status: DC
  Administered 2017-01-31 – 2017-02-03 (×7): 1 g via INTRAVENOUS
  Filled 2017-01-31 (×8): qty 1

## 2017-01-31 MED ORDER — ACETAMINOPHEN 325 MG PO TABS: 650.0000 mg | ORAL_TABLET | Freq: Four times a day (QID) | ORAL | Status: DC | PRN

## 2017-01-31 MED ORDER — SODIUM CHLORIDE 0.9 % IV SOLN
INTRAVENOUS | Status: DC
  Administered 2017-01-31 – 2017-02-01 (×2): via INTRAVENOUS

## 2017-01-31 MED ORDER — LEVOTHYROXINE SODIUM 25 MCG PO TABS
25.0000 ug | ORAL_TABLET | Freq: Every day | ORAL | Status: DC
  Administered 2017-02-01 – 2017-02-04 (×4): 25 ug via ORAL
  Filled 2017-01-31 (×4): qty 1

## 2017-01-31 MED ORDER — FAMOTIDINE 20 MG PO TABS
20.0000 mg | ORAL_TABLET | Freq: Every day | ORAL | Status: DC
  Administered 2017-02-01 – 2017-02-04 (×4): 20 mg via ORAL
  Filled 2017-01-31 (×4): qty 1

## 2017-01-31 MED ORDER — IOPAMIDOL (ISOVUE-370) INJECTION 76%
INTRAVENOUS | Status: AC
  Administered 2017-01-31: 80 mL via INTRAVENOUS
  Filled 2017-01-31: qty 100

## 2017-01-31 MED ORDER — INSULIN ASPART 100 UNIT/ML ~~LOC~~ SOLN
0.0000 [IU] | Freq: Every day | SUBCUTANEOUS | Status: DC
  Administered 2017-01-31: 4 [IU] via SUBCUTANEOUS
  Administered 2017-02-01: 3 [IU] via SUBCUTANEOUS
  Administered 2017-02-03: 2 [IU] via SUBCUTANEOUS

## 2017-01-31 MED ORDER — HEPARIN SODIUM (PORCINE) 5000 UNIT/ML IJ SOLN
5000.0000 [IU] | Freq: Three times a day (TID) | INTRAMUSCULAR | Status: DC
  Administered 2017-01-31 – 2017-02-04 (×11): 5000 [IU] via SUBCUTANEOUS
  Filled 2017-01-31 (×10): qty 1

## 2017-01-31 MED ORDER — BISACODYL 5 MG PO TBEC: 5.0000 mg | DELAYED_RELEASE_TABLET | Freq: Every day | ORAL | Status: DC | PRN

## 2017-01-31 MED ORDER — ACETAMINOPHEN 650 MG RE SUPP: 650.0000 mg | Freq: Four times a day (QID) | RECTAL | Status: DC | PRN

## 2017-01-31 MED ORDER — FERROUS SULFATE 325 (65 FE) MG PO TABS
325.0000 mg | ORAL_TABLET | Freq: Every day | ORAL | Status: DC
  Administered 2017-02-01 – 2017-02-04 (×4): 325 mg via ORAL
  Filled 2017-01-31 (×4): qty 1

## 2017-01-31 MED ORDER — ONDANSETRON HCL 4 MG/2ML IJ SOLN: 4.0000 mg | Freq: Four times a day (QID) | INTRAMUSCULAR | Status: DC | PRN

## 2017-01-31 MED ORDER — ASPIRIN EC 81 MG PO TBEC
81.0000 mg | DELAYED_RELEASE_TABLET | Freq: Every day | ORAL | Status: DC
  Administered 2017-02-01 – 2017-02-04 (×4): 81 mg via ORAL
  Filled 2017-01-31 (×4): qty 1

## 2017-01-31 MED ORDER — INSULIN ASPART 100 UNIT/ML ~~LOC~~ SOLN
0.0000 [IU] | Freq: Three times a day (TID) | SUBCUTANEOUS | Status: DC
  Administered 2017-02-01: 8 [IU] via SUBCUTANEOUS
  Administered 2017-02-01 (×2): 5 [IU] via SUBCUTANEOUS
  Administered 2017-02-02: 8 [IU] via SUBCUTANEOUS
  Administered 2017-02-02: 3 [IU] via SUBCUTANEOUS
  Administered 2017-02-02: 2 [IU] via SUBCUTANEOUS
  Administered 2017-02-03: 11 [IU] via SUBCUTANEOUS
  Administered 2017-02-04: 5 [IU] via SUBCUTANEOUS

## 2017-01-31 MED ORDER — SERTRALINE HCL 50 MG PO TABS
100.0000 mg | ORAL_TABLET | Freq: Every day | ORAL | Status: DC
  Administered 2017-02-01 – 2017-02-04 (×4): 100 mg via ORAL
  Filled 2017-01-31 (×4): qty 2

## 2017-01-31 NOTE — ED Notes (Signed)
Bed: DG38 Expected date:  Expected time:  Means of arrival:  Comments: Held for RESB

## 2017-01-31 NOTE — ED Triage Notes (Addendum)
Pt brought in by daughter for hyperglycemia. Daughter reports sugars have been in 74's over the last several days, patient has been fatigued, sleeping often and confused at times. Pt's meter today read "high" >600, patient drowsy with tachypnea in triage. Oriented x 3. Daughter reports patient fell today at 8am but denies injury.

## 2017-01-31 NOTE — ED Notes (Signed)
Pt. CBG 296, RN,Lindsay made aware.

## 2017-01-31 NOTE — ED Notes (Signed)
Daughter states she gave patient 15units humalog at 11:30.

## 2017-01-31 NOTE — H&P (Signed)
History and Physical    Breanna Shorkey Radovich DGL:[id number] DOB: 08/04/1933 DOA: 01/31/2017  PCP: Leonard Downing, MD   Patient coming from: Home  Chief Complaint: Hyperglycemia, intermittent confusion, fatigue   HPI: RYLEN HOU is a 83 y.o. female with medical history significant for CLL followed by oncology with observation only, insulin-dependent diabetes mellitus, and recurrent UTI followed by urology, presenting to the emergency department with hyperglycemia and intermittent confusion. She is accompanied by her daughter who assists with the history. She had reportedly been in her usual state until she was noted to have persistent hyperglycemia over the past several days with CBGs in the 500s at home. She was evaluated by urology couple days ago on the clinic, reportedly had a CT that was negative for stone, but notable for gas in the bladder, and she was started on nitrofurantoin on 01/29/2017. She has continued to worsen in terms of her hyperglycemia and fatigue since that time. She has been sleeping a lot more, has been experiencing polyuria and polydipsia, and seems to be mildly confused intermittently per her daughter. No flank pain. No fevers or chills.    ED Course: Upon arrival to the ED, patient is found to be afebrile, saturating low 90s on room air, tachypneic up to 30, and with vitals otherwise stable. EKG features a sinus rhythm with early R-transition and chest x-rays negative for acute pulmonary disease. CTA PE study is negative for PE, but notable for emphysema with small effusions and bibasilar consolidation. Chemistry panel reveals a sodium of 132, bicarbonate 19, serum glucose 405, and creatinine 1.38, similar to priors. CBC is notable for a chronic leukocytosis to 24,400 and a stable normocytic anemia with hemoglobin of 9.8. Urinalysis is suggestive of possible infection and is sent for culture. Beta hydroxybutyrate is normal. Patient was treated with 6 units NovoLog, 1 L  normal saline, and 1 g IV Rocephin in the ED. She remained tachypneic and appears uncomfortable, but has been hemodynamically stable and not in any acute respiratory distress. She'll be admitted to the medical-surgical unit for ongoing evaluation and management of fatigue and intermittent confusion suspected secondary to recurrent UTI.   Review of Systems:  All other systems reviewed and apart from HPI, are negative.  Past Medical History:  Diagnosis Date  . Chest pain   . Chronic lymphocytic leukemia ([place])   . CLL (chronic lymphocytic leukemia) ([place]) 06/05/2013  . Diabetes mellitus   . Hyperlipidemia   . Hypertension   . Leukemia ([place])   . SOB (shortness of breath)     Past Surgical History:  Procedure Laterality Date  . CARDIOVASCULAR STRESS TEST  05/29/2010   EF 83%  . CHOLECYSTECTOMY    . TONSILLECTOMY    . TOTAL KNEE ARTHROPLASTY    . US ECHOCARDIOGRAPHY  01/10/2007   EF 55-60%  . US ECHOCARDIOGRAPHY  02/23/2006    EF 55-60%     reports that she has never smoked. She has never used smokeless tobacco. She reports that she does not drink alcohol or use drugs.  Allergies  Allergen Reactions  . Sulfonamide Derivatives Nausea And Vomiting  . Hydrocodone Nausea And Vomiting  . Sulfa Antibiotics Nausea And Vomiting    Family History  Problem Relation Age of Onset  . Stroke Mother   . Lung cancer Father   . COPD Brother      Prior to Admission medications   Medication Sig Start Date End Date Taking? Authorizing Provider  acetaminophen (TYLENOL ARTHRITIS PAIN) 650  MG CR tablet Take 1,300 mg by mouth 2 (two) times daily.     Yes [provider]  aspirin EC 81 MG tablet Take 81 mg by mouth daily.   Yes [provider]  buPROPion (WELLBUTRIN SR) 100 MG 12 hr tablet Take 100 mg by mouth daily.  01/26/17  Yes [provider]  CALCIUM-MAGNESIUM-ZINC PO Take 2 tablets by mouth 2 (two) times daily.    Yes [provider]  carvedilol (COREG) 25  MG tablet Take 1 tablet (25 mg total) by mouth 2 (two) times daily. Please keep upcoming appointment for further refills 01/25/17  Yes Nahser, Wonda Cheng, MD  ferrous sulfate 325 (65 FE) MG tablet Take 325 mg by mouth daily with breakfast.     Yes [provider]  Insulin Glargine (LANTUS) 100 UNIT/ML Solostar Pen Inject 15 Units into the skin daily at 10 pm. Patient taking differently: Inject 17 Units into the skin daily at 10 pm.  12/09/16  Yes Hongalgi, Lenis Dickinson, MD  insulin lispro (HUMALOG) 100 UNIT/ML injection Inject 3-15 Units into the skin 3 (three) times daily before meals. Per sliding scale.   Yes [provider]  levothyroxine (SYNTHROID, LEVOTHROID) 25 MCG tablet Take 25 mcg by mouth daily. 01/25/17  Yes [provider]  nitrofurantoin (MACRODANTIN) 50 MG capsule Take 50 mg by mouth 4 (four) times daily.   Yes [provider]  nystatin cream (MYCOSTATIN) Apply 1 application topically daily as needed for dry skin.   Yes [provider]  ranitidine (ZANTAC) 150 MG tablet Take 150 mg by mouth 2 (two) times daily as needed for heartburn.   Yes [provider]  sertraline (ZOLOFT) 100 MG tablet Take 100 mg by mouth daily after breakfast. 10/26/16  Yes [provider]  amoxicillin (AMOXIL) 500 MG tablet Take 1 tablet (500 mg total) by mouth 3 (three) times daily. Discontinue after 12/15/16 doses. Patient not taking: Reported on 01/31/2017 12/09/16   Modena Jansky, MD  carvedilol (COREG) 3.125 MG tablet Take 1 tablet (3.125 mg total) by mouth 2 (two) times daily with a meal. Patient not taking: Reported on 01/31/2017 12/09/16   Modena Jansky, MD    Physical Exam: Vitals:   01/31/17 1718 01/31/17 1751 01/31/17 1836 01/31/17 1838  BP: 123/73 (!) 148/67 (!) 79/45 121/64  Pulse: (!) 107 74 66   Resp: 15 17 16    Temp:      TempSrc:      SpO2: 93% 95% 98%   Weight:      Height:          Constitutional: NAD, calm,  uncomfortable Eyes: PERTLA, lids and conjunctivae normal ENMT: Mucous membranes are moist. Posterior pharynx clear of any exudate or lesions.   Neck: normal, supple, no masses, no thyromegaly Respiratory: Tachypnea, dyspnea with speech, rhonchi at left lower zone. No accessory muscle use.  Cardiovascular: S1 & S2 heard, regular rate and rhythm. No significant JVD. Abdomen: No distension, no tenderness, no masses palpated. Bowel sounds active.  Musculoskeletal: no clubbing / cyanosis. No joint deformity upper and lower extremities.    Skin: no significant rashes, lesions, ulcers. Warm, dry, well-perfused. Neurologic: CN 2-12 grossly intact. Sensation intact, DTR normal. Strength 5/5 in all 4 limbs.  Psychiatric: Alert and oriented to person and place, but not oriented to month or year. Pleasant, cooperative.     Labs on Admission: I have personally reviewed following labs and imaging studies  CBC:  Recent Labs Lab  01/31/17 1530 01/31/17 1640  WBC 24.4*  --   HGB 9.8* 6.5*  HCT 29.2* 19.0*  MCV 90.7  --   PLT 199  --    Basic Metabolic Panel:  Recent Labs Lab 01/31/17 1549 01/31/17 1640  NA 132* 137  K 3.7 3.3*  CL 106 107  CO2 19*  --   GLUCOSE 405* 364*  BUN 34* 30*  CREATININE 1.38* 1.00  CALCIUM 7.4*  --    GFR: Estimated Creatinine Clearance: 41.9 mL/min (by C-G formula based on SCr of 1 mg/dL). Liver Function Tests:  Recent Labs Lab 01/31/17 1549  AST 17  ALT 11*  ALKPHOS 49  BILITOT 0.7  PROT 5.7*  ALBUMIN 2.5*    Recent Labs Lab 01/31/17 1549  LIPASE 28   No results for input(s): AMMONIA in the last 168 hours. Coagulation Profile: No results for input(s): INR, PROTIME in the last 168 hours. Cardiac Enzymes: No results for input(s): CKTOTAL, CKMB, CKMBINDEX, TROPONINI in the last 168 hours. BNP (last 3 results) No results for input(s): PROBNP in the last 8760 hours. HbA1C: No results for input(s): HGBA1C in the last 72 hours. CBG:  Recent  Labs Lab 01/31/17 1514  GLUCAP 493*   Lipid Profile: No results for input(s): CHOL, HDL, LDLCALC, TRIG, CHOLHDL, LDLDIRECT in the last 72 hours. Thyroid Function Tests: No results for input(s): TSH, T4TOTAL, FREET4, T3FREE, THYROIDAB in the last 72 hours. Anemia Panel: No results for input(s): VITAMINB12, FOLATE, FERRITIN, TIBC, IRON, RETICCTPCT in the last 72 hours. Urine analysis:    Component Value Date/Time   COLORURINE YELLOW 01/31/2017 1504   APPEARANCEUR CLEAR 01/31/2017 1504   LABSPEC 1.015 01/31/2017 1504   PHURINE 6.0 01/31/2017 1504   GLUCOSEU >=500 (A) 01/31/2017 1504   HGBUR NEGATIVE 01/31/2017 1504   BILIRUBINUR NEGATIVE 01/31/2017 1504   KETONESUR NEGATIVE 01/31/2017 1504   PROTEINUR NEGATIVE 01/31/2017 1504   NITRITE NEGATIVE 01/31/2017 1504   LEUKOCYTESUR MODERATE (A) 01/31/2017 1504   Sepsis Labs: @LABRCNTIP (procalcitonin:4,lacticidven:4) )No results found for this or any previous visit (from the past 240 hour(s)).   Radiological Exams on Admission: Ct Angio Chest Pe W And/or Wo Contrast  Result Date: 01/31/2017 CLINICAL DATA:  Shortness of breath and hyperglycemia. History of chronic lymphocytic leukemia. EXAM: CT ANGIOGRAPHY CHEST WITH CONTRAST TECHNIQUE: Multidetector CT imaging of the chest was performed using the standard protocol during bolus administration of intravenous contrast. Multiplanar CT image reconstructions and MIPs were obtained to evaluate the vascular anatomy. CONTRAST:  80 mL Isovue 370 nonionic COMPARISON:  Chest radiograph January 31, 2017. FINDINGS: Cardiovascular: There is no demonstrable pulmonary embolus. There is no appreciable thoracic aortic aneurysm or dissection. There is calcification at the origin of the left common carotid artery. There are foci of calcification in the aorta. There are foci of coronary artery calcification. Pericardium is not appreciably thickened. Mediastinum/Nodes: Visualized thyroid appears unremarkable. There is  adenopathy in the aortopulmonary window region. The largest lymph node in this area measures 1.3 x 1.1 cm. There is mild adenopathy in the left hilar region with largest lymph node in this area measuring 1.1 x 1.1 cm. There is adenopathy in the right hilum with the largest lymph node in this area measuring 1.5 x 1.5 cm. There is sub- carinal adenopathy. The largest lymph node in the sub- carinal region of measures 2.0 x 1.9 cm. There is a nearby lymph node in the right sub- carinal region measuring 1.9 x 1.4 cm. There is no appreciable esophageal lesion.  Lungs/Pleura: There is extensive underlying centrilobular and paraseptal emphysema. There are small pleural effusions bilaterally with patchy bibasilar atelectasis. There is effusion tracking along the right major fissure. There is no well-defined airspace consolidation. Upper Abdomen: Visualized upper abdominal structures appear unremarkable except for absence of the gallbladder. Musculoskeletal:  There are no blastic or lytic bone lesions. Review of the MIP images confirms the above findings. IMPRESSION: 1.  No demonstrable pulmonary embolus. 2.  Aortic atherosclerosis.  Foci of coronary artery calcification. 3. Underlying emphysematous change. Small pleural effusions bilaterally with bibasilar consolidation. 4. Adenopathy at multiple sites. Patient has history of chronic lymphocytic leukemia; question adenopathy secondary to this entity. 5.  Gallbladder absent. Aortic Atherosclerosis (ICD10-I70.0) and Emphysema (ICD10-J43.9). Electronically Signed   By: Lowella Grip III M.D.   On: 01/31/2017 20:34   Dg Chest Portable 1 View  Result Date: 01/31/2017 CLINICAL DATA:  Weakness, shortness of breath, nausea. EXAM: PORTABLE CHEST 1 VIEW COMPARISON:  12/05/2016 FINDINGS: Cardiomediastinal silhouette is normal. Mediastinal contours appear intact. There is no evidence of focal airspace consolidation, pleural effusion or pneumothorax. Low lung volumes. Osseous  structures are without acute abnormality. Soft tissues are grossly normal. IMPRESSION: No active disease. Electronically Signed   By: Fidela Salisbury M.D.   On: 01/31/2017 16:17    EKG: Independently reviewed. Sinus rhythm, early R-transition.   Assessment/Plan  1. Recurrent UTI  - Pt presents with hyperglycemia, intermittent confusion  - Has recurrent UTI, followed by urology and started on nitrofurantoin 01/29/17 but continued to worsen  - There is leukocytosis on arrival (but known CLL), no fever - UA suggests infection and sample sent for culture  - She has recent hx of ESBL E coli in urine   - She was treated with empiric Rocephin in ED, but with recent ESBL infection, will change to meropenem pending cultures    2. ?HCAP  - Pt is noted to be tachypneic in ED  - Found to have rhonchi at left base and bibasilar consolidations on CTA chest  - Plan to add vancomycin given recent hospitalization - Check blood and sputum cultures, check MRSA screen, deescalate as indicated  - Supportive care with prn supplemental O2  3. CLL  - Has been followed by oncology with observation only  - Leukocytosis to 24k on admission, similar to priors    4. Hypertension  - BP at goal  - Continue Coreg as tolerated    5. Depression, anxiety  - Stable  - Continue Zoloft, Wellbutrin   6. Iron-deficiency anemia  - Hbg is 9.8 on admission, stable relative to priors  - No bleeding evident - Continue iron-supplementation   7. CKD stage III  - SCr is 1.38 on admission, stable relative to priors  - Avoid hypotension and nephrotoxins where possible, renally-dose medications prn    8. Insulin-dependent DM - Uncontrolled with CBG's in 500's at home last several days  - A1c was 12.6% in April 2018  - Managed at home with Lantus 17 units qHS and Humalog 3-15 TID  - Given 6 units Novolog and 1 liter NS in ED  - Continue frequent CBG's with Lantus 17 units qHS, and Novolog per moderate-intensity  sliding-scale    9. Paroxysmal atrial fibrillation  - In a sinus rhythm on admission  - CHADS-VASc is 89 (age x2, gender, HTN, DM)  - Not on AC, likely d/t fall-risk  - Continue beta-blocker    DVT prophylaxis: sq heparin  Code Status: DNR Family Communication: Daughter updated at  bedside Disposition Plan: Admit to med-surg Consults called: None Admission status: Inpatient    Vianne Bulls, MD [facility] Pager [phone]  If 7PM-7AM, please contact night-coverage [email or web address] Password [facility]  01/31/2017, 9:35 PM

## 2017-01-31 NOTE — ED Provider Notes (Signed)
[name] DEPT Provider Note   CSN: [id number] Arrival date & time: 01/31/17  1416     History   Chief Complaint Chief Complaint  Patient presents with  . Hyperglycemia  . Altered Mental Status    HPI Carrie Atkins is a 83 y.o. female.  HPI Patient presents to the emergency room for evaluation of hyperglycemia. Patient has been having issues with recurrent UTIs. She went to see the urologist last week for that reason. According to the family members, she had a CT scan in the office that was negative for any kidney stones but it did demonstrate air in her bladder. Patient was restarted on antibiotics, nitrofurantoin, for urinary tract infection. Over the weekend the patient's been having increasing blood sugars. She has been very thirsty and fatigue. She is sleeping more than usual and at times has been confused per the daughter. For example, she told her daughter that she had spoken to her sister recently but her sister is very ill and she could not have spoken to her.  Pt also got ready to leave the house at an inappropriate time and started yelling for her daughter. Patient had a fall earlier today about 8 AM she did not sustain any significant injuries. Patient just has a little soreness in her arm.  No headache or head injury. No vomiting or diarrhea. No fevers.  Past Medical History:  Diagnosis Date  . Chest pain   . Chronic lymphocytic leukemia ([place])   . CLL (chronic lymphocytic leukemia) ([place]) 06/05/2013  . Diabetes mellitus   . Hyperlipidemia   . Hypertension   . Leukemia ([place])   . SOB (shortness of breath)       Past Surgical History:  Procedure Laterality Date  . CARDIOVASCULAR STRESS TEST  05/29/2010   EF 83%  . CHOLECYSTECTOMY    . TONSILLECTOMY    . TOTAL KNEE ARTHROPLASTY    . US ECHOCARDIOGRAPHY  01/10/2007   EF 55-60%  . US ECHOCARDIOGRAPHY  02/23/2006    EF 55-60%    OB History    No data available       Home Medications    Prior to  Admission medications   Medication Sig Start Date End Date Taking? Authorizing Provider  acetaminophen (TYLENOL ARTHRITIS PAIN) 650 MG CR tablet Take 1,300 mg by mouth 2 (two) times daily.     Yes [provider]  aspirin EC 81 MG tablet Take 81 mg by mouth daily.   Yes [provider]  buPROPion (WELLBUTRIN SR) 100 MG 12 hr tablet Take 100 mg by mouth daily.  01/26/17  Yes [provider]  CALCIUM-MAGNESIUM-ZINC PO Take 2 tablets by mouth 2 (two) times daily.    Yes [provider]  carvedilol (COREG) 25 MG tablet Take 1 tablet (25 mg total) by mouth 2 (two) times daily. Please keep upcoming appointment for further refills 01/25/17  Yes Nahser, Wonda Cheng, MD  ferrous sulfate 325 (65 FE) MG tablet Take 325 mg by mouth daily with breakfast.     Yes [provider]  Insulin Glargine (LANTUS) 100 UNIT/ML Solostar Pen Inject 15 Units into the skin daily at 10 pm. Patient taking differently: Inject 17 Units into the skin daily at 10 pm.  12/09/16  Yes Hongalgi, Lenis Dickinson, MD  insulin lispro (HUMALOG) 100 UNIT/ML injection Inject 3-15 Units into the skin 3 (three) times daily before meals. Per sliding scale.   Yes [provider]  levothyroxine (SYNTHROID, LEVOTHROID) 25 MCG  tablet Take 25 mcg by mouth daily. 01/25/17  Yes [provider]  nitrofurantoin (MACRODANTIN) 50 MG capsule Take 50 mg by mouth 4 (four) times daily.   Yes [provider]  nystatin cream (MYCOSTATIN) Apply 1 application topically daily as needed for dry skin.   Yes [provider]  ranitidine (ZANTAC) 150 MG tablet Take 150 mg by mouth 2 (two) times daily as needed for heartburn.   Yes [provider]  sertraline (ZOLOFT) 100 MG tablet Take 100 mg by mouth daily after breakfast. 10/26/16  Yes [provider]  amoxicillin (AMOXIL) 500 MG tablet Take 1 tablet (500 mg total) by mouth 3 (three) times daily. Discontinue after 12/15/16 doses. Patient  not taking: Reported on 01/31/2017 12/09/16   Modena Jansky, MD  carvedilol (COREG) 3.125 MG tablet Take 1 tablet (3.125 mg total) by mouth 2 (two) times daily with a meal. Patient not taking: Reported on 01/31/2017 12/09/16   Modena Jansky, MD  insulin aspart (NOVOLOG) 100 UNIT/ML injection Inject 0-15 Units into the skin 3 (three) times daily with meals. CBG < 70: implement hypoglycemia protocol CBG 70 - 120: 0 units CBG 121 - 150: 2 units CBG 151 - 200: 3 units CBG 201 - 250: 5 units CBG 251 - 300: 8 units CBG 301 - 350: 11 units CBG 351 - 400: 15 units CBG > 400: call MD. Patient not taking: Reported on 01/31/2017 12/09/16   Modena Jansky, MD    Family History Family History  Problem Relation Age of Onset  . Stroke Mother   . Lung cancer Father   . COPD Brother     Social History Social History  Substance Use Topics  . Smoking status: Never Smoker  . Smokeless tobacco: Never Used  . Alcohol use No     Allergies   Sulfonamide derivatives; Hydrocodone; and Sulfa antibiotics   Review of Systems Review of Systems  Constitutional: Negative for fever.  HENT: Negative for voice change.   Respiratory: Negative for shortness of breath.   Cardiovascular: Negative for palpitations.  Gastrointestinal: Negative for abdominal pain.  Musculoskeletal: Negative for joint swelling and neck pain.  Skin: Negative for color change.  Neurological: Negative for numbness (no paresthesias).       No muscle weakness  Psychiatric/Behavioral: Negative for confusion.  All other systems reviewed and are negative.    Physical Exam Updated Vital Signs BP 121/64 Comment: tilted bed back so head is pointing towards floor  Pulse 66   Temp 98.8 F (37.1 C) (Oral)   Resp 16   Ht 1.524 m (5')   Wt 82.1 kg (181 lb)   SpO2 98%   BMI 35.35 kg/m   Physical Exam  Constitutional: No distress.  HENT:  Head: Normocephalic and atraumatic.  Right Ear: External ear normal.  Left Ear:  External ear normal.  Eyes: Conjunctivae are normal. Right eye exhibits no discharge. Left eye exhibits no discharge. No scleral icterus.  Neck: Neck supple. No tracheal deviation present.  Cardiovascular: Normal rate, regular rhythm and intact distal pulses.   Pulmonary/Chest: Effort normal and breath sounds normal. No stridor. Tachypnea noted. No respiratory distress. She has no wheezes. She has no rales.  Abdominal: Soft. Bowel sounds are normal. She exhibits no distension. There is no tenderness. There is no rebound and no guarding.  Musculoskeletal: She exhibits no edema or tenderness.  Neurological: She is alert. No cranial nerve deficit (no facial droop, extraocular movements intact, no slurred speech)  or sensory deficit. She exhibits normal muscle tone. She displays no seizure activity. Coordination normal. GCS eye subscore is 4. GCS verbal subscore is 4. GCS motor subscore is 6.  Generalized weakness, alert to person and place  Skin: Skin is warm and dry. No rash noted. She is not diaphoretic. There is pallor.  Psychiatric: She has a normal mood and affect.  Nursing note and vitals reviewed.    ED Treatments / Results  Labs (all labs ordered are listed, but only abnormal results are displayed) Labs Reviewed  CBC - Abnormal; Notable for the following:       Result Value   WBC 24.4 (*)    RBC 3.22 (*)    Hemoglobin 9.8 (*)    HCT 29.2 (*)    All other components within normal limits  URINALYSIS, ROUTINE W REFLEX MICROSCOPIC - Abnormal; Notable for the following:    Glucose, UA >=500 (*)    Leukocytes, UA MODERATE (*)    Bacteria, UA MANY (*)    Squamous Epithelial / LPF 0-5 (*)    All other components within normal limits  COMPREHENSIVE METABOLIC PANEL - Abnormal; Notable for the following:    Sodium 132 (*)    CO2 19 (*)    Glucose, Bld 405 (*)    BUN 34 (*)    Creatinine, Ser 1.38 (*)    Calcium 7.4 (*)    Total Protein 5.7 (*)    Albumin 2.5 (*)    ALT 11 (*)     GFR calc non Af Amer 35 (*)    GFR calc Af Amer 40 (*)    All other components within normal limits  BLOOD GAS, VENOUS - Abnormal; Notable for the following:    pCO2, Ven 42.3 (*)    All other components within normal limits  CBG MONITORING, ED - Abnormal; Notable for the following:    Glucose-Capillary 493 (*)    All other components within normal limits  I-STAT CHEM 8, ED - Abnormal; Notable for the following:    Potassium 3.3 (*)    BUN 30 (*)    Glucose, Bld 364 (*)    Calcium, Ion 0.92 (*)    Hemoglobin 6.5 (*)    HCT 19.0 (*)    All other components within normal limits  URINE CULTURE  BETA-HYDROXYBUTYRIC ACID  LIPASE, BLOOD    EKG  EKG Interpretation  Date/Time:  Sunday January 31 2017 15:16:24 EDT Ventricular Rate:  63 PR Interval:    QRS Duration: 87 QT Interval:  416 QTC Calculation: 426 R Axis:   20 Text Interpretation:  Sinus rhythm Abnormal R-wave progression, early transition No old tracing to compare Confirmed by Dorie Rank [phone]) on 01/31/2017 5:52:02 PM       Radiology Ct Angio Chest Pe W And/or Wo Contrast  Result Date: 01/31/2017 CLINICAL DATA:  Shortness of breath and hyperglycemia. History of chronic lymphocytic leukemia. EXAM: CT ANGIOGRAPHY CHEST WITH CONTRAST TECHNIQUE: Multidetector CT imaging of the chest was performed using the standard protocol during bolus administration of intravenous contrast. Multiplanar CT image reconstructions and MIPs were obtained to evaluate the vascular anatomy. CONTRAST:  80 mL Isovue 370 nonionic COMPARISON:  Chest radiograph January 31, 2017. FINDINGS: Cardiovascular: There is no demonstrable pulmonary embolus. There is no appreciable thoracic aortic aneurysm or dissection. There is calcification at the origin of the left common carotid artery. There are foci of calcification in the aorta. There are foci of coronary artery calcification. Pericardium is not  appreciably thickened. Mediastinum/Nodes: Visualized thyroid  appears unremarkable. There is adenopathy in the aortopulmonary window region. The largest lymph node in this area measures 1.3 x 1.1 cm. There is mild adenopathy in the left hilar region with largest lymph node in this area measuring 1.1 x 1.1 cm. There is adenopathy in the right hilum with the largest lymph node in this area measuring 1.5 x 1.5 cm. There is sub- carinal adenopathy. The largest lymph node in the sub- carinal region of measures 2.0 x 1.9 cm. There is a nearby lymph node in the right sub- carinal region measuring 1.9 x 1.4 cm. There is no appreciable esophageal lesion. Lungs/Pleura: There is extensive underlying centrilobular and paraseptal emphysema. There are small pleural effusions bilaterally with patchy bibasilar atelectasis. There is effusion tracking along the right major fissure. There is no well-defined airspace consolidation. Upper Abdomen: Visualized upper abdominal structures appear unremarkable except for absence of the gallbladder. Musculoskeletal:  There are no blastic or lytic bone lesions. Review of the MIP images confirms the above findings. IMPRESSION: 1.  No demonstrable pulmonary embolus. 2.  Aortic atherosclerosis.  Foci of coronary artery calcification. 3. Underlying emphysematous change. Small pleural effusions bilaterally with bibasilar consolidation. 4. Adenopathy at multiple sites. Patient has history of chronic lymphocytic leukemia; question adenopathy secondary to this entity. 5.  Gallbladder absent. Aortic Atherosclerosis (ICD10-I70.0) and Emphysema (ICD10-J43.9). Electronically Signed   By: Lowella Grip III M.D.   On: 01/31/2017 20:34   Dg Chest Portable 1 View  Result Date: 01/31/2017 CLINICAL DATA:  Weakness, shortness of breath, nausea. EXAM: PORTABLE CHEST 1 VIEW COMPARISON:  12/05/2016 FINDINGS: Cardiomediastinal silhouette is normal. Mediastinal contours appear intact. There is no evidence of focal airspace consolidation, pleural effusion or pneumothorax.  Low lung volumes. Osseous structures are without acute abnormality. Soft tissues are grossly normal. IMPRESSION: No active disease. Electronically Signed   By: Fidela Salisbury M.D.   On: 01/31/2017 16:17    Procedures Procedures (including critical care time)  Medications Ordered in ED Medications  sodium chloride 0.9 % bolus 1,000 mL (0 mLs Intravenous Stopped 01/31/17 1757)    And  0.9 %  sodium chloride infusion ( Intravenous New Bag/Given 01/31/17 1817)  cefTRIAXone (ROCEPHIN) 1 g in dextrose 5 % 50 mL IVPB (not administered)  insulin aspart (novoLOG) injection 6 Units (6 Units Subcutaneous Given 01/31/17 1817)  iopamidol (ISOVUE-370) 76 % injection (80 mLs Intravenous Contrast Given 01/31/17 2008)     Initial Impression / Assessment and Plan / ED Course  I have reviewed the triage vital signs and the nursing notes.  Pertinent labs & imaging results that were available during my care of the patient were reviewed by me and considered in my medical decision making (see chart for details).  Clinical Course as of Feb 01 2108  Nancy Fetter Jan 31, 2017  2107 Documented blood pressure 79/45 is inaccurate.  That is not a true blood pressure  [JK]    Clinical Course User Index [JK] Dorie Rank, MD  patient presented to the emergency room for evaluation of hyperglycemia, possible UTI and confusion.  His laboratory tests are notable for hyperglycemia but no signs of ketoacidosis. Patient had some tachypnea earlier so a CT scan was done to evaluate for the possibility of pulmonary embolism. CT scan shows chronic findings but no signs of pneumonia or pulmonary embolus.  urine shows a urinary tract infection. Patient has leukocytosis but she does have leukemia. No signs of sepsis but with her comorbidities and the confusion  L started on IV antibiotics and consult the medical service for admission.  Final Clinical Impressions(s) / ED Diagnoses   Final diagnoses:  Acute cystitis without hematuria    Confusion  Hyperglycemia      Dorie Rank, MD 01/31/17 2110

## 2017-02-01 LAB — STREP PNEUMONIAE URINARY ANTIGEN: Strep Pneumo Urinary Antigen: NEGATIVE

## 2017-02-01 LAB — GLUCOSE, CAPILLARY
GLUCOSE-CAPILLARY: 259 mg/dL — AB (ref 65–99)
GLUCOSE-CAPILLARY: 294 mg/dL — AB (ref 65–99)
Glucose-Capillary: 213 mg/dL — ABNORMAL HIGH (ref 65–99)
Glucose-Capillary: 249 mg/dL — ABNORMAL HIGH (ref 65–99)

## 2017-02-01 LAB — BASIC METABOLIC PANEL
Anion gap: 8 (ref 5–15)
BUN: 30 mg/dL — ABNORMAL HIGH (ref 6–20)
CALCIUM: 8.3 mg/dL — AB (ref 8.9–10.3)
CHLORIDE: 100 mmol/L — AB (ref 101–111)
CO2: 22 mmol/L (ref 22–32)
CREATININE: 1.36 mg/dL — AB (ref 0.44–1.00)
GFR calc Af Amer: 41 mL/min — ABNORMAL LOW (ref >60)
GFR calc non Af Amer: 35 mL/min — ABNORMAL LOW (ref >60)
GLUCOSE: 314 mg/dL — AB (ref 65–99)
Potassium: 4 mmol/L (ref 3.5–5.1)
Sodium: 130 mmol/L — ABNORMAL LOW (ref 135–145)

## 2017-02-01 LAB — HEMOGLOBIN A1C
Hgb A1c MFr Bld: 8.3 % — ABNORMAL HIGH (ref 4.8–5.6)
Mean Plasma Glucose: 191.51 mg/dL

## 2017-02-01 MED ORDER — VANCOMYCIN HCL 10 G IV SOLR
1250.0000 mg | INTRAVENOUS | Status: DC
  Administered 2017-02-01: 1250 mg via INTRAVENOUS
  Filled 2017-02-01: qty 1250

## 2017-02-01 MED ORDER — INSULIN GLARGINE 100 UNIT/ML ~~LOC~~ SOLN
15.0000 [IU] | Freq: Two times a day (BID) | SUBCUTANEOUS | Status: DC
  Administered 2017-02-01 – 2017-02-04 (×7): 15 [IU] via SUBCUTANEOUS
  Filled 2017-02-01 (×8): qty 0.15

## 2017-02-01 MED ORDER — INSULIN GLARGINE 100 UNIT/ML ~~LOC~~ SOLN
15.0000 [IU] | Freq: Two times a day (BID) | SUBCUTANEOUS | Status: DC
  Filled 2017-02-01: qty 0.15

## 2017-02-01 MED ORDER — CARVEDILOL 3.125 MG PO TABS
3.1250 mg | ORAL_TABLET | Freq: Two times a day (BID) | ORAL | Status: DC
  Administered 2017-02-01 – 2017-02-04 (×7): 3.125 mg via ORAL
  Filled 2017-02-01 (×7): qty 1

## 2017-02-01 NOTE — Progress Notes (Signed)
Inpatient Diabetes Program Recommendations  AACE/ADA: New Consensus Statement on Inpatient Glycemic Control (2015)  Target Ranges:  Prepandial:   less than 140 mg/dL      Peak postprandial:   less than 180 mg/dL (1-2 hours)      Critically ill patients:  140 - 180 mg/dL   Lab Results  Component Value Date   GLUCAP 213 (H) 02/01/2017   HGBA1C 8.3 (H) 02/01/2017    Review of Glycemic ControlResults for MANSI, TOKAR (MRN [id number]) as of 02/01/2017 11:07  Ref. Range 01/31/2017 15:14 01/31/2017 22:05 01/31/2017 22:43 02/01/2017 07:31  Glucose-Capillary Latest Ref Range: 65 - 99 mg/dL 493 (H) 296 (H) 341 (H) 213 (H)   Diabetes history: Type 2 diabetes Outpatient Diabetes medications: Lantus 17 units q HS, Humalog 3-15 units tid with meals Current orders for Inpatient glycemic control:  Novolog moderate tid with meals, Lantus 15 units bid  Inpatient Diabetes Program Recommendations:    Note that A1C is improved from 09/18/16.  Blood sugars improving.  Agree with current orders.  Will follow.  Thanks, Adah Perl, RN, BC-ADM Inpatient Diabetes Coordinator Pager [phone] (8a-5p)

## 2017-02-01 NOTE — Progress Notes (Signed)
Pharmacy Antibiotic Note  Carrie Atkins is a 83 y.o. female admitted on 01/31/2017 with pneumonia.  Pharmacy has been consulted for Vancomycin, meropenem dosing.  Plan: Vancomycin 1gm iv x1, then 1250mg  iv q24hr (goal 15-20) Meropenem 1gm iv q12hr   Height: 5' (152.4 cm) Weight: 181 lb (82.1 kg) IBW/kg (Calculated) : 45.5  Temp (24hrs), Avg:98.7 F (37.1 C), Min:98.2 F (36.8 C), Max:99.1 F (37.3 C)   Recent Labs Lab 01/31/17 1530 01/31/17 1549 01/31/17 1640 02/01/17 0112  WBC 24.4*  --   --  18.5*  CREATININE  --  1.38* 1.00 1.36*    Estimated Creatinine Clearance: 30.8 mL/min (A) (by C-G formula based on SCr of 1.36 mg/dL (H)).    Allergies  Allergen Reactions  . Sulfonamide Derivatives Nausea And Vomiting  . Hydrocodone Nausea And Vomiting  . Sulfa Antibiotics Nausea And Vomiting    Antimicrobials this admission: Vancomycin 01/31/2017 >> meropenem 01/31/2017 >>   Dose adjustments this admission: -  Microbiology results: pending  Thank you for allowing pharmacy to be a part of this patient's care.  Nani Skillern Crowford 02/01/2017 3:02 AM

## 2017-02-01 NOTE — Care Management Note (Signed)
Case Management Note  Patient Details  Name: FIA HEBERT MRN: [id number] Date of Birth: 04/25/1934  Subjective/Objective:     dka with ams               Action/Plan: Date:  February 01, 2017  Chart reviewed for concurrent status and case management needs.  Will continue to follow patient progress.  Discharge Planning: following for needs  Expected discharge date: [id number]  Velva Harman, BSN, [place], [place]   Expected Discharge Date:                  Expected Discharge Plan:  Home/Self Care  In-House Referral:     Discharge planning Services  CM Consult  Post Acute Care Choice:    Choice offered to:     DME Arranged:    DME Agency:     HH Arranged:    HH Agency:     Status of Service:  In process, will continue to follow  If discussed at Long Length of Stay Meetings, dates discussed:    Additional Comments:  Leeroy Cha, RN 02/01/2017, 9:36 AM

## 2017-02-01 NOTE — Progress Notes (Signed)
[facility] PROGRESS NOTE    Progress Note  Carrie Atkins  IYM:[id number] DOB: 1934/06/07 DOA: 01/31/2017 PCP: Leonard Downing, MD     Brief Narrative:   Carrie Atkins is an 83 y.o. female past medical history significant for CLL followed by oncology, insulin-dependent diabetes mellitus, recurrent UTIs followed by urology presents to the ED with hyperglycemia and confusion. She recently saw her urologist for start her on nitrofurantoin on 01/29/2017,  her hyperglycemia worsen.  Assessment/Plan:   Recurrent Acute cystitis without hematuria: U/A showed moderate white cells, many bacteria but negative for nitrates. She was started empirically on IV meropenem due to her history of ESBL, she has a leukocytosis on admission but she has remained afebrile. She has a leukocytosis is likely due to CLL.  Questionable HCAP: She is noted to be tachypneic in the ED found to have ronchis on physical exam, but CTA showed basilar consolidation, she was recently discharged from the hospital she was started empirically on IV vancomycin, and continue meropenem.  CLL: Followed by oncology as an outpatient. Her white count is unchanged from previous.  Essential HTN (hypertension) Continue Coreg at a lower dose blood pressure at goal.  Depression/anxiety: Continue Zoloft and Wellbutrin.  Iron deficiency anemia: No signs of bleeding hemoglobin seems to be at baseline.  Chronic kidney disease stage III: Baseline creatinine is 1.3-1.5 seems to be at baseline.  Insulin-dependent diabetes mellitus: Blood dose running high we'll increase it to 15 units twice a day continue sliding scale insulin.  Paroxysmal atrial fibrillation: Continue beta blockers at a lower dose. He is rate controlled,CHADS-VASc is 38 (age x2, gender, HTN, DM). Not on anticoagulation due to high fall risk.   DVT prophylaxis: lovenox Family Communication:none Disposition Plan/Barrier to D/C: home in 2-3  days Code Status:     Code Status Orders        Start     Ordered   01/31/17 2132  Do not attempt resuscitation (DNR)  Continuous    Question Answer Comment  In the event of cardiac or respiratory ARREST Do not call a "code blue"   In the event of cardiac or respiratory ARREST Do not perform Intubation, CPR, defibrillation or ACLS   In the event of cardiac or respiratory ARREST Use medication by any route, position, wound care, and other measures to relive pain and suffering. May use oxygen, suction and manual treatment of airway obstruction as needed for comfort.      01/31/17 2135    Code Status History    Date Active Date Inactive Code Status Order ID Comments User Context   2020-10-1416  2:48 AM 12/09/2016  9:38 PM DNR [id number]  Edwin Dada, MD ED   09/17/2016  5:47 PM 09/24/2016 12:53 AM Full Code [id number]  Debbe Odea, MD Inpatient   12/31/2015  4:45 PM 01/04/2016  2:07 PM Partial Code [id number]  Rush Farmer, MD Inpatient   12/31/2015  1:42 PM 12/31/2015  4:45 PM Partial Code [id number]  Erick Colace, NP ED    Advance Directive Documentation     Most Recent Value  Type of Advance Directive  Living will, Healthcare Power of Attorney  Pre-existing out of facility DNR order (yellow form or pink MOST form)  -  "MOST" Form in Place?  -        IV Access:    Peripheral IV   Procedures and diagnostic studies:   Ct Angio Chest Pe W And/or Wo Contrast  Result Date: 01/31/2017  CLINICAL DATA:  Shortness of breath and hyperglycemia. History of chronic lymphocytic leukemia. EXAM: CT ANGIOGRAPHY CHEST WITH CONTRAST TECHNIQUE: Multidetector CT imaging of the chest was performed using the standard protocol during bolus administration of intravenous contrast. Multiplanar CT image reconstructions and MIPs were obtained to evaluate the vascular anatomy. CONTRAST:  80 mL Isovue 370 nonionic COMPARISON:  Chest radiograph January 31, 2017. FINDINGS: Cardiovascular: There is no  demonstrable pulmonary embolus. There is no appreciable thoracic aortic aneurysm or dissection. There is calcification at the origin of the left common carotid artery. There are foci of calcification in the aorta. There are foci of coronary artery calcification. Pericardium is not appreciably thickened. Mediastinum/Nodes: Visualized thyroid appears unremarkable. There is adenopathy in the aortopulmonary window region. The largest lymph node in this area measures 1.3 x 1.1 cm. There is mild adenopathy in the left hilar region with largest lymph node in this area measuring 1.1 x 1.1 cm. There is adenopathy in the right hilum with the largest lymph node in this area measuring 1.5 x 1.5 cm. There is sub- carinal adenopathy. The largest lymph node in the sub- carinal region of measures 2.0 x 1.9 cm. There is a nearby lymph node in the right sub- carinal region measuring 1.9 x 1.4 cm. There is no appreciable esophageal lesion. Lungs/Pleura: There is extensive underlying centrilobular and paraseptal emphysema. There are small pleural effusions bilaterally with patchy bibasilar atelectasis. There is effusion tracking along the right major fissure. There is no well-defined airspace consolidation. Upper Abdomen: Visualized upper abdominal structures appear unremarkable except for absence of the gallbladder. Musculoskeletal:  There are no blastic or lytic bone lesions. Review of the MIP images confirms the above findings. IMPRESSION: 1.  No demonstrable pulmonary embolus. 2.  Aortic atherosclerosis.  Foci of coronary artery calcification. 3. Underlying emphysematous change. Small pleural effusions bilaterally with bibasilar consolidation. 4. Adenopathy at multiple sites. Patient has history of chronic lymphocytic leukemia; question adenopathy secondary to this entity. 5.  Gallbladder absent. Aortic Atherosclerosis (ICD10-I70.0) and Emphysema (ICD10-J43.9). Electronically Signed   By: Lowella Grip III M.D.   On: 01/31/2017  20:34   Dg Chest Portable 1 View  Result Date: 01/31/2017 CLINICAL DATA:  Weakness, shortness of breath, nausea. EXAM: PORTABLE CHEST 1 VIEW COMPARISON:  12/05/2016 FINDINGS: Cardiomediastinal silhouette is normal. Mediastinal contours appear intact. There is no evidence of focal airspace consolidation, pleural effusion or pneumothorax. Low lung volumes. Osseous structures are without acute abnormality. Soft tissues are grossly normal. IMPRESSION: No active disease. Electronically Signed   By: Fidela Salisbury M.D.   On: 01/31/2017 16:17     Medical Consultants:    None.  Anti-Infectives:   IV vancomycin and meropenem.  Subjective:    Carrie Atkins she has no new complains.  Objective:    Vitals:   01/31/17 1838 01/31/17 2145 01/31/17 2250 02/01/17 0527  BP: 121/64 119/86 (!) 131/52 (!) 128/55  Pulse:  70 92 92  Resp:  (!) 21 16 20   Temp:  99.1 F (37.3 C) 98.2 F (36.8 C) 98.2 F (36.8 C)  TempSrc:  Oral Oral Oral  SpO2:  95% 94% 93%  Weight:    85.5 kg (188 lb 7.9 oz)  Height:        Intake/Output Summary (Last 24 hours) at 02/01/17 0741 Last data filed at 02/01/17 0600  Gross per 24 hour  Intake           943.75 ml  Output  100 ml  Net           843.75 ml   Filed Weights   01/31/17 1545 02/01/17 0527  Weight: 82.1 kg (181 lb) 85.5 kg (188 lb 7.9 oz)    Exam: General exam: In no acute distress. Respiratory system: Good air movement and clear to auscultation. Cardiovascular system: Regular in rhythm with positive S1-S2 Gastrointestinal system: Positive bowel sounds soft nontender nondistended Central nervous system: Awake alert and oriented 3 nonfocal. Extremities: No lower extremity edema. Skin: No rashes. Psychiatry: Judgment and insight appear normal.   Data Reviewed:    Labs: Basic Metabolic Panel:  Recent Labs Lab 01/31/17 1549 01/31/17 1640 02/01/17 0112  NA 132* 137 130*  K 3.7 3.3* 4.0  CL 106 107 100*  CO2 19*   --  22  GLUCOSE 405* 364* 314*  BUN 34* 30* 30*  CREATININE 1.38* 1.00 1.36*  CALCIUM 7.4*  --  8.3*   GFR Estimated Creatinine Clearance: 31.5 mL/min (A) (by C-G formula based on SCr of 1.36 mg/dL (H)). Liver Function Tests:  Recent Labs Lab 01/31/17 1549  AST 17  ALT 11*  ALKPHOS 49  BILITOT 0.7  PROT 5.7*  ALBUMIN 2.5*    Recent Labs Lab 01/31/17 1549  LIPASE 28   No results for input(s): AMMONIA in the last 168 hours. Coagulation profile No results for input(s): INR, PROTIME in the last 168 hours.  CBC:  Recent Labs Lab 01/31/17 1530 01/31/17 1640 02/01/17 0112  WBC 24.4*  --  18.5*  NEUTROABS  --   --  15.1*  HGB 9.8* 6.5* 8.8*  HCT 29.2* 19.0* 26.3*  MCV 90.7  --  91.3  PLT 199  --  164   Cardiac Enzymes: No results for input(s): CKTOTAL, CKMB, CKMBINDEX, TROPONINI in the last 168 hours. BNP (last 3 results) No results for input(s): PROBNP in the last 8760 hours. CBG:  Recent Labs Lab 01/31/17 1514 01/31/17 2205 01/31/17 2243 02/01/17 0731  GLUCAP 493* 296* 341* 213*   D-Dimer: No results for input(s): DDIMER in the last 72 hours. Hgb A1c: No results for input(s): HGBA1C in the last 72 hours. Lipid Profile: No results for input(s): CHOL, HDL, LDLCALC, TRIG, CHOLHDL, LDLDIRECT in the last 72 hours. Thyroid function studies: No results for input(s): TSH, T4TOTAL, T3FREE, THYROIDAB in the last 72 hours.  Invalid input(s): FREET3 Anemia work up: No results for input(s): VITAMINB12, FOLATE, FERRITIN, TIBC, IRON, RETICCTPCT in the last 72 hours. Sepsis Labs:  Recent Labs Lab 01/31/17 1530 02/01/17 0112  WBC 24.4* 18.5*   Microbiology No results found for this or any previous visit (from the past 240 hour(s)).   Medications:   . aspirin EC  81 mg Oral Daily  . buPROPion  100 mg Oral Daily  . carvedilol  25 mg Oral BID  . famotidine  20 mg Oral Daily  . ferrous sulfate  325 mg Oral Q breakfast  . heparin  5,000 Units Subcutaneous  Q8H  . insulin aspart  0-15 Units Subcutaneous TID WC  . insulin aspart  0-5 Units Subcutaneous QHS  . insulin glargine  17 Units Subcutaneous QHS  . levothyroxine  25 mcg Oral QAC breakfast  . multivitamin with minerals  1 tablet Oral BID  . sertraline  100 mg Oral QPC breakfast   Continuous Infusions: . sodium chloride 75 mL/hr at 01/31/17 2205  . meropenem (MERREM) IV Stopped (01/31/17 2330)  . vancomycin       LOS: 1  day   Carrie Atkins  [facility] Pager [phone]  *Please refer to [email or web address], password [email or web address] to get updated schedule on who will round on this patient, as hospitalists switch teams weekly. If 7PM-7AM, please contact night-coverage at [email or web address], password [email or web address] for any overnight needs.  02/01/2017, 7:41 AM

## 2017-02-02 ENCOUNTER — Inpatient Hospital Stay (HOSPITAL_COMMUNITY): Payer: Medicare Other

## 2017-02-02 LAB — CBC WITH DIFFERENTIAL/PLATELET
Basophils Absolute: 0 10*3/uL (ref 0.0–0.1)
Basophils Relative: 0 %
Eosinophils Absolute: 0.7 10*3/uL (ref 0.0–0.7)
Eosinophils Relative: 4 %
HCT: 26.3 % — ABNORMAL LOW (ref 36.0–46.0)
HEMOGLOBIN: 8.8 g/dL — AB (ref 12.0–15.0)
LYMPHS ABS: 2.6 10*3/uL (ref 0.7–4.0)
LYMPHS PCT: 14 %
MCH: 30.6 pg (ref 26.0–34.0)
MCHC: 33.5 g/dL (ref 30.0–36.0)
MCV: 91.3 fL (ref 78.0–100.0)
MONO ABS: 0.8 10*3/uL (ref 0.1–1.0)
MONOS PCT: 4 %
NEUTROS ABS: 15.1 10*3/uL — AB (ref 1.7–7.7)
Neutrophils Relative %: 79 %
Platelets: 164 10*3/uL (ref 150–400)
RBC: 2.88 MIL/uL — ABNORMAL LOW (ref 3.87–5.11)
RDW: 15.1 % (ref 11.5–15.5)
WBC: 18.5 10*3/uL — ABNORMAL HIGH (ref 4.0–10.5)

## 2017-02-02 LAB — GLUCOSE, CAPILLARY
GLUCOSE-CAPILLARY: 180 mg/dL — AB (ref 65–99)
Glucose-Capillary: 133 mg/dL — ABNORMAL HIGH (ref 65–99)
Glucose-Capillary: 268 mg/dL — ABNORMAL HIGH (ref 65–99)
Glucose-Capillary: 157 mg/dL — ABNORMAL HIGH (ref 65–99)

## 2017-02-02 LAB — URINE CULTURE

## 2017-02-02 LAB — HIV ANTIBODY (ROUTINE TESTING W REFLEX): HIV SCREEN 4TH GENERATION: NONREACTIVE

## 2017-02-02 LAB — BASIC METABOLIC PANEL
Anion gap: 8 (ref 5–15)
BUN: 25 mg/dL — AB (ref 6–20)
CHLORIDE: 103 mmol/L (ref 101–111)
CO2: 23 mmol/L (ref 22–32)
Calcium: 8.7 mg/dL — ABNORMAL LOW (ref 8.9–10.3)
Creatinine, Ser: 1.21 mg/dL — ABNORMAL HIGH (ref 0.44–1.00)
GFR calc Af Amer: 47 mL/min — ABNORMAL LOW (ref >60)
GFR calc non Af Amer: 41 mL/min — ABNORMAL LOW (ref >60)
GLUCOSE: 238 mg/dL — AB (ref 65–99)
POTASSIUM: 4.6 mmol/L (ref 3.5–5.1)
Sodium: 134 mmol/L — ABNORMAL LOW (ref 135–145)

## 2017-02-02 MED ORDER — SODIUM CHLORIDE 0.9 % IV SOLN: INTRAVENOUS | Status: DC

## 2017-02-02 NOTE — Progress Notes (Signed)
MBSS completed, full report to follow.  Pt with mild oropharyngeal dysphagia without aspiration of any consistency tested.  She did appear to have prominent cricopharyngeus that resulted in backflow of liquids x1 to pyriform sinus region.  Mild residuals noted on tongue base x1 due to oral propulsive weakness without pt awareness but cued dry swallows helpful to clear.    Upon esophageal sweep, pt appeared with slow clearance of esophagus - suspect dysmotility but radiologist not present to confirm. Intake of thin liquids facilitated clearance. Of note, pt did cough x1 at end of MBS when being positioned to be slid back into bed. She did NOT cough t/o entire MBSS while consuming barium.  Suspect primary esophageal dysphagia.  Using live video, educated pt to findings/recommendations.    Regular/thin Medicine with puree - start and follow with liquids Start meals with liquids Drink liquids t/o meal  Intermittent dry swallow Esophageal precautions.  [name], [facility] SLP [phone]

## 2017-02-02 NOTE — Progress Notes (Signed)
[facility] PROGRESS NOTE    Progress Note  Carrie Atkins  IRC:[id number] DOB: Apr 18, 1934 DOA: 01/31/2017 PCP: Leonard Downing, MD     Brief Narrative:   Carrie Atkins is an 83 y.o. female past medical history significant for CLL followed by oncology, insulin-dependent diabetes mellitus, recurrent UTIs followed by urology presents to the ED with hyperglycemia and confusion. She recently saw her urologist for start her on nitrofurantoin on 01/29/2017,  her hyperglycemia worsen.  Assessment/Plan:   Recurrent Acute cystitis without hematuria: U/A showed moderate white cells, many bacteria but negative for nitrates. Urine culture will have low yield, as she was previously on antibiotics.  Ifculture data continues to be negative, Can place PICC line on 8.22.2018. She was started empirically on IV meropenem due to her history of ESBL, she has a leukocytosis on admission but she has remained afebrile. She has a leukocytosis is likely due to CLL.  Questionable HCAP: Has remained afebrile with no respiratory symptoms. Discontinue IV Vank. Into the meropenem. Swallowing evaluation was done, speech recommended an MB BS  CLL: Followed by oncology as an outpatient. Her white count is unchanged from previous.  Essential HTN (hypertension) Continue Coreg at a lower dose blood pressure at goal.  Depression/anxiety: Continue Zoloft and Wellbutrin.  Iron deficiency anemia: No signs of bleeding hemoglobin seems to be at baseline.  Chronic kidney disease stage III: Baseline creatinine is 1.3-1.5 seems to be at baseline.  Insulin-dependent diabetes mellitus: Continue long-acting insulin twice a day plus sliding scale.  Paroxysmal atrial fibrillation: Continue beta blockers at a lower dose. He is rate controlled,CHADS-VASc is 65 (age x2, gender, HTN, DM). Not on anticoagulation due to high fall risk.  New mild hyponatremia: Likely pseudohyponatremia due to elevated blood  glucose. Basic metabolic panels pending.   DVT prophylaxis: lovenox Family Communication:none Disposition Plan/Barrier to D/C:  probably in the morning. Code Status:     Code Status Orders        Start     Ordered   01/31/17 2132  Do not attempt resuscitation (DNR)  Continuous    Question Answer Comment  In the event of cardiac or respiratory ARREST Do not call a "code blue"   In the event of cardiac or respiratory ARREST Do not perform Intubation, CPR, defibrillation or ACLS   In the event of cardiac or respiratory ARREST Use medication by any route, position, wound care, and other measures to relive pain and suffering. May use oxygen, suction and manual treatment of airway obstruction as needed for comfort.      01/31/17 2135    Code Status History    Date Active Date Inactive Code Status Order ID Comments User Context   06/16/2016  2:48 AM 12/09/2016  9:38 PM DNR [id number]  Carrie Dada, MD ED   09/17/2016  5:47 PM 09/24/2016 12:53 AM Full Code [id number]  Debbe Odea, MD Inpatient   12/31/2015  4:45 PM 01/04/2016  2:07 PM Partial Code [id number]  Rush Farmer, MD Inpatient   12/31/2015  1:42 PM 12/31/2015  4:45 PM Partial Code [id number]  Erick Colace, NP ED    Advance Directive Documentation     Most Recent Value  Type of Advance Directive  Living will, Healthcare Power of Attorney  Pre-existing out of facility DNR order (yellow form or pink MOST form)  -  "MOST" Form in Place?  -        IV Access:    Peripheral IV   Procedures  and diagnostic studies:   Ct Angio Chest Pe W And/or Wo Contrast  Result Date: 01/31/2017 CLINICAL DATA:  Shortness of breath and hyperglycemia. History of chronic lymphocytic leukemia. EXAM: CT ANGIOGRAPHY CHEST WITH CONTRAST TECHNIQUE: Multidetector CT imaging of the chest was performed using the standard protocol during bolus administration of intravenous contrast. Multiplanar CT image reconstructions and MIPs were obtained to  evaluate the vascular anatomy. CONTRAST:  80 mL Isovue 370 nonionic COMPARISON:  Chest radiograph January 31, 2017. FINDINGS: Cardiovascular: There is no demonstrable pulmonary embolus. There is no appreciable thoracic aortic aneurysm or dissection. There is calcification at the origin of the left common carotid artery. There are foci of calcification in the aorta. There are foci of coronary artery calcification. Pericardium is not appreciably thickened. Mediastinum/Nodes: Visualized thyroid appears unremarkable. There is adenopathy in the aortopulmonary window region. The largest lymph node in this area measures 1.3 x 1.1 cm. There is mild adenopathy in the left hilar region with largest lymph node in this area measuring 1.1 x 1.1 cm. There is adenopathy in the right hilum with the largest lymph node in this area measuring 1.5 x 1.5 cm. There is sub- carinal adenopathy. The largest lymph node in the sub- carinal region of measures 2.0 x 1.9 cm. There is a nearby lymph node in the right sub- carinal region measuring 1.9 x 1.4 cm. There is no appreciable esophageal lesion. Lungs/Pleura: There is extensive underlying centrilobular and paraseptal emphysema. There are small pleural effusions bilaterally with patchy bibasilar atelectasis. There is effusion tracking along the right major fissure. There is no well-defined airspace consolidation. Upper Abdomen: Visualized upper abdominal structures appear unremarkable except for absence of the gallbladder. Musculoskeletal:  There are no blastic or lytic bone lesions. Review of the MIP images confirms the above findings. IMPRESSION: 1.  No demonstrable pulmonary embolus. 2.  Aortic atherosclerosis.  Foci of coronary artery calcification. 3. Underlying emphysematous change. Small pleural effusions bilaterally with bibasilar consolidation. 4. Adenopathy at multiple sites. Patient has history of chronic lymphocytic leukemia; question adenopathy secondary to this entity. 5.   Gallbladder absent. Aortic Atherosclerosis (ICD10-I70.0) and Emphysema (ICD10-J43.9). Electronically Signed   By: Lowella Grip III M.D.   On: 01/31/2017 20:34   Dg Chest Portable 1 View  Result Date: 01/31/2017 CLINICAL DATA:  Weakness, shortness of breath, nausea. EXAM: PORTABLE CHEST 1 VIEW COMPARISON:  12/05/2016 FINDINGS: Cardiomediastinal silhouette is normal. Mediastinal contours appear intact. There is no evidence of focal airspace consolidation, pleural effusion or pneumothorax. Low lung volumes. Osseous structures are without acute abnormality. Soft tissues are grossly normal. IMPRESSION: No active disease. Electronically Signed   By: Fidela Salisbury M.D.   On: 01/31/2017 16:17     Medical Consultants:    None.  Anti-Infectives:   IV vancomycin and meropenem.  Subjective:    Kelilah Hebard Ohalloran feels great but have been coughing with eating.  Objective:    Vitals:   02/01/17 1404 02/01/17 1411 02/01/17 2021 02/02/17 0514  BP: (!) 111/49 (!) 119/53 130/70 131/70  Pulse: 72 72 66 (!) 58  Resp: 18 18 20 20   Temp: 97.7 F (36.5 C) 98.7 F (37.1 C) 98.7 F (37.1 C) 98.3 F (36.8 C)  TempSrc: Oral Oral Oral Oral  SpO2: 97% 96% 98% 96%  Weight:    85.3 kg (188 lb 0.8 oz)  Height:        Intake/Output Summary (Last 24 hours) at 02/02/17 7026 Last data filed at 02/02/17 0228  Gross per 24 hour  Intake              460 ml  Output             1700 ml  Net            -1240 ml   Filed Weights   01/31/17 1545 02/01/17 0527 02/02/17 0514  Weight: 82.1 kg (181 lb) 85.5 kg (188 lb 7.9 oz) 85.3 kg (188 lb 0.8 oz)    Exam: General exam: In no acute distress. Respiratory system: Good air movement clear to auscultation. Cardiovascular system: Regular rate and rhythm with positive S1-S2 Gastrointestinal system: Possible bowel sounds soft nontender nondistended Central nervous system: Awake alert and oriented 3, nonfocal. Extremities: Lower extremity edema. Skin:  Rashes or ulceration. Psychiatry: Judgment and insight appear normal.   Data Reviewed:    Labs: Basic Metabolic Panel:  Recent Labs Lab 01/31/17 1549 01/31/17 1640 02/01/17 0112  NA 132* 137 130*  K 3.7 3.3* 4.0  CL 106 107 100*  CO2 19*  --  22  GLUCOSE 405* 364* 314*  BUN 34* 30* 30*  CREATININE 1.38* 1.00 1.36*  CALCIUM 7.4*  --  8.3*   GFR Estimated Creatinine Clearance: 31.4 mL/min (A) (by C-G formula based on SCr of 1.36 mg/dL (H)). Liver Function Tests:  Recent Labs Lab 01/31/17 1549  AST 17  ALT 11*  ALKPHOS 49  BILITOT 0.7  PROT 5.7*  ALBUMIN 2.5*    Recent Labs Lab 01/31/17 1549  LIPASE 28   No results for input(s): AMMONIA in the last 168 hours. Coagulation profile No results for input(s): INR, PROTIME in the last 168 hours.  CBC:  Recent Labs Lab 01/31/17 1530 01/31/17 1640 02/01/17 0112  WBC 24.4*  --  18.5*  NEUTROABS  --   --  15.1*  HGB 9.8* 6.5* 8.8*  HCT 29.2* 19.0* 26.3*  MCV 90.7  --  91.3  PLT 199  --  164   Cardiac Enzymes: No results for input(s): CKTOTAL, CKMB, CKMBINDEX, TROPONINI in the last 168 hours. BNP (last 3 results) No results for input(s): PROBNP in the last 8760 hours. CBG:  Recent Labs Lab 02/01/17 0731 02/01/17 1144 02/01/17 1702 02/01/17 2025 02/02/17 0735  GLUCAP 213* 249* 259* 294* 133*   D-Dimer: No results for input(s): DDIMER in the last 72 hours. Hgb A1c:  Recent Labs  02/01/17 0112  HGBA1C 8.3*   Lipid Profile: No results for input(s): CHOL, HDL, LDLCALC, TRIG, CHOLHDL, LDLDIRECT in the last 72 hours. Thyroid function studies: No results for input(s): TSH, T4TOTAL, T3FREE, THYROIDAB in the last 72 hours.  Invalid input(s): FREET3 Anemia work up: No results for input(s): VITAMINB12, FOLATE, FERRITIN, TIBC, IRON, RETICCTPCT in the last 72 hours. Sepsis Labs:  Recent Labs Lab 01/31/17 1530 02/01/17 0112  WBC 24.4* 18.5*   Microbiology No results found for this or any  previous visit (from the past 240 hour(s)).   Medications:   . aspirin EC  81 mg Oral Daily  . buPROPion  100 mg Oral Daily  . carvedilol  3.125 mg Oral BID WC  . famotidine  20 mg Oral Daily  . ferrous sulfate  325 mg Oral Q breakfast  . heparin  5,000 Units Subcutaneous Q8H  . insulin aspart  0-15 Units Subcutaneous TID WC  . insulin aspart  0-5 Units Subcutaneous QHS  . insulin glargine  15 Units Subcutaneous BID  . levothyroxine  25 mcg Oral QAC breakfast  . multivitamin with minerals  1 tablet Oral BID  . sertraline  100 mg Oral QPC breakfast   Continuous Infusions: . meropenem (MERREM) IV Stopped (02/01/17 2147)  . vancomycin Stopped (02/01/17 2005)     LOS: 2 days   Charlynne Cousins  [facility] Pager [phone]  *Please refer to [email or web address], password [email or web address] to get updated schedule on who will round on this patient, as hospitalists switch teams weekly. If 7PM-7AM, please contact night-coverage at [email or web address], password [email or web address] for any overnight needs.  02/02/2017, 8:52 AM

## 2017-02-02 NOTE — Evaluation (Signed)
Clinical/Bedside Swallow Evaluation Patient Details  Name: Carrie Atkins MRN: [id number] Date of Birth: 10-Aug-1933  Today's Date: 02/02/2017 Time: SLP Start Time (ACUTE ONLY): 0858 SLP Stop Time (ACUTE ONLY): 0920 SLP Time Calculation (min) (ACUTE ONLY): 22 min  Past Medical History:  Past Medical History:  Diagnosis Date  . Chest pain   . Chronic lymphocytic leukemia ([place])   . CLL (chronic lymphocytic leukemia) ([place]) 06/05/2013  . Diabetes mellitus   . Hyperlipidemia   . Hypertension   . Leukemia ([place])   . SOB (shortness of breath)    Past Surgical History:  Past Surgical History:  Procedure Laterality Date  . CARDIOVASCULAR STRESS TEST  05/29/2010   EF 83%  . CHOLECYSTECTOMY    . TONSILLECTOMY    . TOTAL KNEE ARTHROPLASTY    . US ECHOCARDIOGRAPHY  01/10/2007   EF 55-60%  . US ECHOCARDIOGRAPHY  02/23/2006    EF 55-60%   HPI:  83 yo female adm to [facility] with acute cystitis without hematuria.  PMH + for CLL- CXR showed emphysema but negative for pna.  Swallow evaluation ordered due to pt having cough after intake.  Pt admits to issues with swallowing stating at times that food will not "go down" - she indicates difficulties orally in pharynx and esophagus.  Pt admits to dysphagia x one month initially but then later denied - ? poor historian.     Assessment / Plan / Recommendation Clinical Impression  Pt with negative cranial nerve exam.  Clinical evaluation with intake of water, juice, applesauce and cracker did not elicit s/s of dysphagia/aspiration.  Pt does admit to sensation of food lodging at times - pointing to throat and mid-esophagus.  She states extra swallows help clear sensation of residuals.  Of note, pt has NO UVULA - She stated the uvula disappeared after a tonsillectomy.  Approximately 5 minutes after evaluation,  pt heard overtly coughing - when SLP arrived to room, she stated "I can't swallow" and reported difficulties with grits. SLP suspects primary deficits are  esophageal but can not rule out oropharyngeal deficit.  Spoke to MD who agrees to proceed with MBS.   SLP Visit Diagnosis: Dysphagia, unspecified (R13.10)    Aspiration Risk  Moderate aspiration risk    Diet Recommendation     Liquid Administration via: Straw Medication Administration: Whole meds with puree Supervision: Patient able to self feed Compensations: Slow rate;Small sips/bites (start meals with liquids, drink liquids t/o meal) Postural Changes: Seated upright at 90 degrees;Remain upright for at least 30 minutes after po intake    Other  Recommendations Oral Care Recommendations: Oral care BID   Follow up Recommendations  (tbd)      Frequency and Duration min 2x/week  2 weeks       Prognosis Prognosis for Safe Diet Advancement: Fair Barriers to Reach Goals: Other (Comment) (per RN, daughter states pt has had problems swallowing for years)      Swallow Study   General Date of Onset: 02/02/17 HPI: 83 yo female adm to [facility] with acute cystitis without hematuria.  PMH + for CLL- CXR showed emphysema but negative for pna.  Swallow evaluation ordered due to pt having cough after intake.  Pt admits to issues with swallowing stating at times that food will not "go down" - she indicates difficulties orally in pharynx and esophagus.  Pt admits to dysphagia x one month initially but then later denied - ? poor historian.   Type of Study: Bedside Swallow Evaluation Diet  Prior to this Study: Regular;Thin liquids Temperature Spikes Noted: No Respiratory Status: Room air History of Recent Intubation: No Behavior/Cognition: Alert;Cooperative;Pleasant mood Oral Cavity Assessment: Dry;Dried secretions Oral Cavity - Dentition: Poor condition Vision: Functional for self-feeding Self-Feeding Abilities: Able to feed self Patient Positioning: Upright in bed Baseline Vocal Quality: Normal Volitional Cough: Strong Volitional Swallow:  (dnt xerostomia)    Oral/Motor/Sensory Function  Overall Oral Motor/Sensory Function: Within functional limits   Ice Chips Ice chips: Not tested   Thin Liquid Thin Liquid: Within functional limits Presentation: Self Fed;Straw;Cup    Nectar Thick Nectar Thick Liquid: Not tested   Honey Thick Honey Thick Liquid: Not tested   Puree Puree: Within functional limits Presentation: Self Fed;Spoon   Solid   GO   Solid: Within functional limits Presentation: Self Fredirick Atkins 02/02/2017,9:42 AM  Carrie Atkins, [facility] SLP [phone]

## 2017-02-03 DIAGNOSIS — N3000 Acute cystitis without hematuria: Secondary | ICD-10-CM

## 2017-02-03 DIAGNOSIS — C9190 Lymphoid leukemia, unspecified not having achieved remission: Secondary | ICD-10-CM

## 2017-02-03 DIAGNOSIS — I10 Essential (primary) hypertension: Secondary | ICD-10-CM

## 2017-02-03 DIAGNOSIS — Z1612 Extended spectrum beta lactamase (ESBL) resistance: Secondary | ICD-10-CM

## 2017-02-03 DIAGNOSIS — A499 Bacterial infection, unspecified: Secondary | ICD-10-CM

## 2017-02-03 DIAGNOSIS — N183 Chronic kidney disease, stage 3 (moderate): Secondary | ICD-10-CM

## 2017-02-03 LAB — GLUCOSE, CAPILLARY
GLUCOSE-CAPILLARY: 97 mg/dL (ref 65–99)
GLUCOSE-CAPILLARY: 309 mg/dL — AB (ref 65–99)
GLUCOSE-CAPILLARY: 145 mg/dL — AB (ref 65–99)
GLUCOSE-CAPILLARY: 205 mg/dL — AB (ref 65–99)

## 2017-02-03 LAB — BASIC METABOLIC PANEL
ANION GAP: 7 (ref 5–15)
BUN: 24 mg/dL — ABNORMAL HIGH (ref 6–20)
CHLORIDE: 108 mmol/L (ref 101–111)
CO2: 24 mmol/L (ref 22–32)
CREATININE: 1.18 mg/dL — AB (ref 0.44–1.00)
Calcium: 9 mg/dL (ref 8.9–10.3)
GFR calc non Af Amer: 42 mL/min — ABNORMAL LOW (ref >60)
GFR, EST AFRICAN AMERICAN: 49 mL/min — AB (ref >60)
Glucose, Bld: 98 mg/dL (ref 65–99)
Potassium: 4.2 mmol/L (ref 3.5–5.1)
SODIUM: 139 mmol/L (ref 135–145)

## 2017-02-03 NOTE — Care Management Important Message (Signed)
Important Message  Patient Details IM Letter given to Rhonda/Case Manager to present to Patient Name: Carrie Atkins MRN: [id number] Date of Birth: [birth year]   Medicare Important Message Given:  Yes    Kerin Salen 02/03/2017, 11:15 AMImportant Message  Patient Details  Name: Carrie Atkins MRN: [id number] Date of Birth: 1933/09/12   Medicare Important Message Given:  Yes    Kerin Salen 02/03/2017, 11:15 AM

## 2017-02-03 NOTE — Evaluation (Signed)
Physical Therapy Evaluation Patient Details Name: Carrie Atkins MRN: [id number] DOB: 09/18/1933 Today's Date: 02/03/2017   History of Present Illness  83 y.o. female with a past medical history significant for CLL, IDDM with neuropathy, HTN, and ESBL UTI and admitted with hyperglycemia and recurrent acute cystitis  Clinical Impression  Pt admitted as above and presenting with functional mobility limitations 2* generalized weakness and mild ambulatory balance deficits.  Pt should progress to dc home with family assist.    Follow Up Recommendations No PT follow up    Equipment Recommendations  None recommended by PT    Recommendations for Other Services OT consult     Precautions / Restrictions Precautions Precautions: Fall Restrictions Weight Bearing Restrictions: No      Mobility  Bed Mobility Overal bed mobility: Modified Independent             General bed mobility comments: Pt to EOB unassisted but with increased time  Transfers Overall transfer level: Needs assistance Equipment used: Rolling walker (2 wheeled) Transfers: Sit to/from Stand Sit to Stand: Min guard         General transfer comment: cues for transition position and use of UEs to self assist  Ambulation/Gait Ambulation/Gait assistance: Min guard Ambulation Distance (Feet): 200 Feet Assistive device: Rolling walker (2 wheeled) Gait Pattern/deviations: Step-to pattern;Step-through pattern;Decreased step length - right;Decreased step length - left;Shuffle;Trunk flexed Gait velocity: decr Gait velocity interpretation: Below normal speed for age/gender General Gait Details: cues for posture and position from RW.  Pt mildly unstable utilizing RW but with no LOB  Stairs            Wheelchair Mobility    Modified Rankin (Stroke Patients Only)       Balance Overall balance assessment: Needs assistance Sitting-balance support: No upper extremity supported;Feet supported Sitting  balance-Leahy Scale: Good     Standing balance support: Bilateral upper extremity supported Standing balance-Leahy Scale: Fair                               Pertinent Vitals/Pain Pain Assessment: No/denies pain    Home Living Family/patient expects to be discharged to:: Private residence Living Arrangements: Alone Available Help at Discharge: Family;Available PRN/intermittently Type of Home: Apartment Home Access: Ramped entrance     Home Layout: One level Home Equipment: Walker - 4 wheels Additional Comments: Pt lives in apt attached to her dtrs house    Prior Function Level of Independence: Independent with assistive device(s)         Comments: uses rollator, daughter available intermittently     Hand Dominance        Extremity/Trunk Assessment   Upper Extremity Assessment Upper Extremity Assessment: Generalized weakness    Lower Extremity Assessment Lower Extremity Assessment: Generalized weakness       Communication   Communication: No difficulties  Cognition Arousal/Alertness: Awake/alert Behavior During Therapy: WFL for tasks assessed/performed;Flat affect Overall Cognitive Status: Within Functional Limits for tasks assessed                                        General Comments      Exercises     Assessment/Plan    PT Assessment Patient needs continued PT services  PT Problem List Decreased strength;Decreased range of motion;Decreased activity tolerance;Decreased balance;Decreased mobility;Decreased knowledge of use of DME  PT Treatment Interventions DME instruction;Gait training;Stair training;Functional mobility training;Therapeutic activities;Therapeutic exercise;Patient/family education    PT Goals (Current goals can be found in the Care Plan section)  Acute Rehab PT Goals Patient Stated Goal: Resume previous lifestyle  PT Goal Formulation: With patient Time For Goal Achievement: 02/13/17 Potential  to Achieve Goals: Good    Frequency Min 3X/week   Barriers to discharge        Co-evaluation               AM-PAC PT "6 Clicks" Daily Activity  Outcome Measure Difficulty turning over in bed (including adjusting bedclothes, sheets and blankets)?: A Little Difficulty moving from lying on back to sitting on the side of the bed? : A Little Difficulty sitting down on and standing up from a chair with arms (e.g., wheelchair, bedside commode, etc,.)?: A Little Help needed moving to and from a bed to chair (including a wheelchair)?: A Little Help needed walking in hospital room?: A Little Help needed climbing 3-5 steps with a railing? : A Little 6 Click Score: 18    End of Session Equipment Utilized During Treatment: Gait belt Activity Tolerance: Patient tolerated treatment well Patient left: in chair;with call bell/phone within reach;with chair alarm set Nurse Communication: Mobility status PT Visit Diagnosis: Unsteadiness on feet (R26.81);Difficulty in walking, not elsewhere classified (R26.2)    Time: 9323-5573 PT Time Calculation (min) (ACUTE ONLY): 19 min   Charges:   PT Evaluation $PT Eval Low Complexity: 1 Low     PT G Codes:        Pg [phone]   Minnah Llamas 02/03/2017, 12:42 PM

## 2017-02-03 NOTE — Progress Notes (Signed)
PROGRESS NOTE  Carrie Atkins XFG:[id number] DOB: 1933-12-05 DOA: 01/31/2017 PCP: Leonard Downing, MD   LOS: 3 days   Brief Narrative / Interim history: Carrie Atkins is an 83 y.o. female past medical history significant for CLL followed by oncology, insulin-dependent diabetes mellitus, recurrent UTIs followed by urology presents to the ED with hyperglycemia and confusion. She recently saw her urologist for start her on nitrofurantoin on 01/29/2017 for a UTI  Assessment & Plan: Principal Problem:   Acute cystitis without hematuria Active Problems:   HTN (hypertension)   CLL (chronic lymphocytic leukemia) (HCC)   Hyperglycemia   Paroxysmal atrial fibrillation (HCC)   ESBL (extended spectrum beta-lactamase) producing bacteria infection   HCAP (healthcare-associated pneumonia)   Insulin dependent diabetes mellitus (HCC)   CKD (chronic kidney disease), stage III   UTI (urinary tract infection)   Recurrent UTI -Urine cultures have remained negative, likely due to the fact that she was on nitrofurantoin prior to being admitted -Started on IV meropenem due to history of ESBL. Continue for today, changed to fosfomycin tomorrow if cultures remain negative  Questionable HCAP -no respiratory symptoms, unlikely pneumonia  CLL -Chronic leukocytosis, followed by oncology as an outpatient  Hypertension -Continue Coreg  Depression/anxiety -Continue home medications  Iron deficiency anemia -Hemoglobin stable, recheck tomorrow morning  Paroxysmal A. fib -Continue beta-blockers, not on anticoagulation due to high fall risk   DVT prophylaxis: Lovenox Code Status: DNR Family Communication: d/w daughter bedside Disposition Plan: home vs SNF 1 day. PT evaluation pending  Consultants:   None  Procedures:   None   Antimicrobials:  Meropenem 8/19 >>  Subjective: - no chest pain, shortness of breath, no abdominal pain, nausea or vomiting.   Objective: Vitals:   02/02/17 1500 02/02/17 2119 02/03/17 0500 02/03/17 0513  BP: (!) 141/71 126/73  (!) 142/65  Pulse: 62 (!) 56  (!) 59  Resp: 20 20  18   Temp: 98.9 F (37.2 C) 99.7 F (37.6 C)  98.5 F (36.9 C)  TempSrc: Oral Oral  Oral  SpO2: 99% 99%  96%  Weight:   84.2 kg (185 lb 10 oz)   Height:        Intake/Output Summary (Last 24 hours) at 02/03/17 1159 Last data filed at 02/03/17 1015  Gross per 24 hour  Intake              480 ml  Output             1000 ml  Net             -520 ml   Filed Weights   02/01/17 0527 02/02/17 0514 02/03/17 0500  Weight: 85.5 kg (188 lb 7.9 oz) 85.3 kg (188 lb 0.8 oz) 84.2 kg (185 lb 10 oz)    Examination:  Vitals:   02/02/17 1500 02/02/17 2119 02/03/17 0500 02/03/17 0513  BP: (!) 141/71 126/73  (!) 142/65  Pulse: 62 (!) 56  (!) 59  Resp: 20 20  18   Temp: 98.9 F (37.2 C) 99.7 F (37.6 C)  98.5 F (36.9 C)  TempSrc: Oral Oral  Oral  SpO2: 99% 99%  96%  Weight:   84.2 kg (185 lb 10 oz)   Height:        Constitutional: NAD Eyes: PERRL, lids and conjunctivae normal Respiratory: clear to auscultation bilaterally, no wheezing, no crackles. Normal respiratory effort.  Cardiovascular: Regular rate and rhythm, no murmurs / rubs / gallops. No LE edema. 2+ pedal pulses Abdomen:  no tenderness. Bowel sounds positive.  Neurologic: CN 2-12 grossly intact. Strength 5/5 in all 4.  Psychiatric: Normal judgment and insight. Alert and oriented x 3. Normal mood.    Data Reviewed: I have independently reviewed following labs and imaging studies   CBC:  Recent Labs Lab 01/31/17 1530 01/31/17 1640 02/01/17 0112  WBC 24.4*  --  18.5*  NEUTROABS  --   --  15.1*  HGB 9.8* 6.5* 8.8*  HCT 29.2* 19.0* 26.3*  MCV 90.7  --  91.3  PLT 199  --  789   Basic Metabolic Panel:  Recent Labs Lab 01/31/17 1549 01/31/17 1640 02/01/17 0112 02/02/17 1407 02/03/17 0630  NA 132* 137 130* 134* 139  K 3.7 3.3* 4.0 4.6 4.2  CL 106 107 100* 103 108  CO2 19*  --  22  23 24   GLUCOSE 405* 364* 314* 238* 98  BUN 34* 30* 30* 25* 24*  CREATININE 1.38* 1.00 1.36* 1.21* 1.18*  CALCIUM 7.4*  --  8.3* 8.7* 9.0   GFR: Estimated Creatinine Clearance: 36 mL/min (A) (by C-G formula based on SCr of 1.18 mg/dL (H)). Liver Function Tests:  Recent Labs Lab 01/31/17 1549  AST 17  ALT 11*  ALKPHOS 49  BILITOT 0.7  PROT 5.7*  ALBUMIN 2.5*    Recent Labs Lab 01/31/17 1549  LIPASE 28   No results for input(s): AMMONIA in the last 168 hours. Coagulation Profile: No results for input(s): INR, PROTIME in the last 168 hours. Cardiac Enzymes: No results for input(s): CKTOTAL, CKMB, CKMBINDEX, TROPONINI in the last 168 hours. BNP (last 3 results) No results for input(s): PROBNP in the last 8760 hours. HbA1C:  Recent Labs  02/01/17 0112  HGBA1C 8.3*   CBG:  Recent Labs Lab 02/02/17 1150 02/02/17 1639 02/02/17 2101 02/03/17 0730 02/03/17 1139  GLUCAP 268* 157* 180* 97 309*   Lipid Profile: No results for input(s): CHOL, HDL, LDLCALC, TRIG, CHOLHDL, LDLDIRECT in the last 72 hours. Thyroid Function Tests: No results for input(s): TSH, T4TOTAL, FREET4, T3FREE, THYROIDAB in the last 72 hours. Anemia Panel: No results for input(s): VITAMINB12, FOLATE, FERRITIN, TIBC, IRON, RETICCTPCT in the last 72 hours. Urine analysis:    Component Value Date/Time   COLORURINE YELLOW 01/31/2017 1504   APPEARANCEUR CLEAR 01/31/2017 1504   LABSPEC 1.015 01/31/2017 1504   PHURINE 6.0 01/31/2017 1504   GLUCOSEU >=500 (A) 01/31/2017 1504   HGBUR NEGATIVE 01/31/2017 1504   BILIRUBINUR NEGATIVE 01/31/2017 1504   KETONESUR NEGATIVE 01/31/2017 1504   PROTEINUR NEGATIVE 01/31/2017 1504   NITRITE NEGATIVE 01/31/2017 1504   LEUKOCYTESUR MODERATE (A) 01/31/2017 1504   Sepsis Labs: Invalid input(s): PROCALCITONIN, LACTICIDVEN  Recent Results (from the past 240 hour(s))  Urine Culture     Status: Abnormal   Collection Time: 01/31/17  8:52 PM  Result Value Ref Range  Status   Specimen Description URINE, RANDOM  Final   Special Requests NONE  Final   Culture MULTIPLE SPECIES PRESENT, SUGGEST RECOLLECTION (A)  Final   Report Status 02/02/2017 FINAL  Final  Culture, blood (routine x 2) Call MD if unable to obtain prior to antibiotics being given     Status: None (Preliminary result)   Collection Time: 02/01/17  1:12 AM  Result Value Ref Range Status   Specimen Description BLOOD LEFT HAND  Final   Special Requests IN PEDIATRIC BOTTLE Blood Culture adequate volume  Final   Culture   Final    NO GROWTH 2 DAYS Performed at  [facility], [place] [address] [place], [place] [address]    Report Status PENDING  Incomplete  Culture, blood (routine x 2) Call MD if unable to obtain prior to antibiotics being given     Status: None (Preliminary result)   Collection Time: 02/01/17  1:12 AM  Result Value Ref Range Status   Specimen Description BLOOD RIGHT HAND  Final   Special Requests IN PEDIATRIC BOTTLE Blood Culture adequate volume  Final   Culture   Final    NO GROWTH 2 DAYS Performed at [facility], [place] [address] [place], [place] [address]    Report Status PENDING  Incomplete      Radiology Studies: Dg Swallowing Func-speech Pathology  Result Date: 02/02/2017 Objective Swallowing Evaluation: Type of Study: MBS-Modified Barium Swallow Study Patient Details Name: ANNITTA FIFIELD MRN: [id number] Date of Birth: April 11, 1934 Today's Date: 02/02/2017 Time: SLP Start Time (ACUTE ONLY): 1215-SLP Stop Time (ACUTE ONLY): 1250 SLP Time Calculation (min) (ACUTE ONLY): 35 min Past Medical History: Past Medical History: Diagnosis Date . Chest pain  . Chronic lymphocytic leukemia ([place])  . CLL (chronic lymphocytic leukemia) ([place]) 06/05/2013 . Diabetes mellitus  . Hyperlipidemia  . Hypertension  . Leukemia ([place])  . SOB (shortness of breath)  Past Surgical History: Past Surgical History: Procedure Laterality Date . CARDIOVASCULAR STRESS TEST  05/29/2010  EF 83% .  CHOLECYSTECTOMY   . TONSILLECTOMY   . TOTAL KNEE ARTHROPLASTY   . US ECHOCARDIOGRAPHY  01/10/2007  EF 55-60% . US ECHOCARDIOGRAPHY  02/23/2006   EF 55-60% HPI: 83 yo female adm to [facility] with acute cystitis without hematuria.  PMH + for CLL- CXR showed emphysema but negative for pna.  Swallow evaluation ordered due to pt having cough after intake.  Pt admits to issues with swallowing stating at times that food will not "go down" - she indicates difficulties orally in pharynx and esophagus.  Pt admits to dysphagia x one month initially but then later denied - ? poor historian.   Subjective: pt in chair, reports need to use RR, faciliated care Assessment / Plan / Recommendation CHL IP CLINICAL IMPRESSIONS 02/02/2017 Clinical Impression Pt with mild oropharyngeal dysphagia without aspiration of any consistency tested.  She did appear to have prominent cricopharyngeus that resulted in backflow of liquids x1 to pyriform sinus region.  Mild residuals noted on tongue base x1 due to oral propulsive weakness without pt awareness but cued dry swallows helpful to clear.  Upon esophageal sweep, pt appeared with slow clearance of esophagus - suspect dysmotility but radiologist not present to confirm. Intake of thin liquids facilitated clearance. Of note, pt did cough x1 at end of MBS when being positioned to be slid back into bed. She did NOT cough t/o entire MBSS while consuming barium.  Suspect primary esophageal dysphagia.  Using live video, educated pt to findings/recommendations.  Regular/thin Medicine with puree - start and follow with liquids SLP Visit Diagnosis Dysphagia, pharyngoesophageal phase (R13.14);Dysphagia, oropharyngeal phase (R13.12);Other (comment) Attention and concentration deficit following -- Frontal lobe and executive function deficit following -- Impact on safety and function Moderate aspiration risk   CHL IP TREATMENT RECOMMENDATION 02/02/2017 Treatment Recommendations Therapy as outlined in treatment plan  below   Prognosis 02/02/2017 Prognosis for Safe Diet Advancement Fair Barriers to Reach Goals Other (Comment) Barriers/Prognosis Comment -- CHL IP DIET RECOMMENDATION 02/02/2017 SLP Diet Recommendations Regular solids;Thin liquid Liquid Administration via -- Medication Administration Whole meds with puree Compensations Slow rate;Small sips/bites;Follow solids with liquid  Postural Changes Remain semi-upright after after feeds/meals (Comment);Seated upright at 90 degrees   CHL IP OTHER RECOMMENDATIONS 02/02/2017 Recommended Consults -- Oral Care Recommendations Oral care BID Other Recommendations --   CHL IP FOLLOW UP RECOMMENDATIONS 02/02/2017 Follow up Recommendations (No Data)   CHL IP FREQUENCY AND DURATION 02/02/2017 Speech Therapy Frequency (ACUTE ONLY) min 1 x/week Treatment Duration 1 week      CHL IP ORAL PHASE 02/02/2017 Oral Phase Impaired Oral - Pudding Teaspoon -- Oral - Pudding Cup -- Oral - Honey Teaspoon -- Oral - Honey Cup -- Oral - Nectar Teaspoon -- Oral - Nectar Cup Weak lingual manipulation Oral - Nectar Straw -- Oral - Thin Teaspoon Weak lingual manipulation;Premature spillage Oral - Thin Cup Weak lingual manipulation;Premature spillage Oral - Thin Straw Weak lingual manipulation;Premature spillage Oral - Puree Weak lingual manipulation Oral - Mech Soft -- Oral - Regular Weak lingual manipulation Oral - Multi-Consistency -- Oral - Pill Weak lingual manipulation;Premature spillage Oral Phase - Comment --  CHL IP PHARYNGEAL PHASE 02/02/2017 Pharyngeal Phase Impaired Pharyngeal- Pudding Teaspoon -- Pharyngeal -- Pharyngeal- Pudding Cup -- Pharyngeal -- Pharyngeal- Honey Teaspoon -- Pharyngeal -- Pharyngeal- Honey Cup -- Pharyngeal -- Pharyngeal- Nectar Teaspoon WFL Pharyngeal -- Pharyngeal- Nectar Cup WFL Pharyngeal -- Pharyngeal- Nectar Straw -- Pharyngeal -- Pharyngeal- Thin Teaspoon WFL Pharyngeal -- Pharyngeal- Thin Cup Delayed swallow initiation-vallecula;Penetration/Aspiration during  swallow;Pharyngeal residue - valleculae Pharyngeal Material enters airway, remains ABOVE vocal cords then ejected out Pharyngeal- Thin Straw Penetration/Aspiration during swallow Pharyngeal Material enters airway, remains ABOVE vocal cords then ejected out Pharyngeal- Puree Reduced tongue base retraction;Pharyngeal residue - valleculae Pharyngeal -- Pharyngeal- Mechanical Soft -- Pharyngeal -- Pharyngeal- Regular WFL Pharyngeal -- Pharyngeal- Multi-consistency -- Pharyngeal -- Pharyngeal- Pill WFL Pharyngeal -- Pharyngeal Comment --  CHL IP CERVICAL ESOPHAGEAL PHASE 02/02/2017 Cervical Esophageal Phase Impaired Pudding Teaspoon -- Pudding Cup -- Honey Teaspoon -- Honey Cup -- Nectar Teaspoon WFL Nectar Cup [facility] Nectar Straw -- Thin Teaspoon Reduced cricopharyngeal relaxation Thin Cup Reduced cricopharyngeal relaxation;Esophageal backflow into the pharynx Thin Straw Reduced cricopharyngeal relaxation Puree WFL Mechanical Soft -- Regular WFL Multi-consistency -- Pill WFL Cervical Esophageal Comment pt did not sense backflowed material at pyriform sinus region,  backflow was only trace No flowsheet data found. Macario Golds 02/02/2017, 2:13 PM  [name], MS [facility] SLP [phone]               Scheduled Meds: . aspirin EC  81 mg Oral Daily  . buPROPion  100 mg Oral Daily  . carvedilol  3.125 mg Oral BID WC  . famotidine  20 mg Oral Daily  . ferrous sulfate  325 mg Oral Q breakfast  . heparin  5,000 Units Subcutaneous Q8H  . insulin aspart  0-15 Units Subcutaneous TID WC  . insulin aspart  0-5 Units Subcutaneous QHS  . insulin glargine  15 Units Subcutaneous BID  . levothyroxine  25 mcg Oral QAC breakfast  . multivitamin with minerals  1 tablet Oral BID  . sertraline  100 mg Oral QPC breakfast   Continuous Infusions: . meropenem (MERREM) IV Stopped (02/03/17 9417)    Marzetta Board, MD, PhD [facility] Pager [phone]  If 7PM-7AM, please contact  night-coverage [email or web address] Password [email or web address] 02/03/2017, 11:59 AM

## 2017-02-03 NOTE — Progress Notes (Signed)
Inpatient Diabetes Program Recommendations  AACE/ADA: New Consensus Statement on Inpatient Glycemic Control (2015)  Target Ranges:  Prepandial:   less than 140 mg/dL      Peak postprandial:   less than 180 mg/dL (1-2 hours)      Critically ill patients:  140 - 180 mg/dL   Results for AERIANA, SPEECE (MRN [id number]) as of 02/03/2017 14:28  Ref. Range 02/02/2017 07:35 02/02/2017 11:50 02/02/2017 16:39 02/02/2017 21:01  Glucose-Capillary Latest Ref Range: 65 - 99 mg/dL 133 (H)  2 units Novolog  268 (H)  8 units Novolog  157 (H)  3 units Novolog  180 (H)   Results for RASHELL, SHAMBAUGH (MRN [id number]) as of 02/03/2017 14:28  Ref. Range 02/03/2017 07:30 02/03/2017 11:39  Glucose-Capillary Latest Ref Range: 65 - 99 mg/dL 97  0 units Novolog  309 (H)  11 units Novolog     Home DM Meds: Lantus 17 units QHS       Humalog 3-15 units TID per SSI  Current Insulin Orders: Lantus 15 units BID      Novolog Moderate Correction Scale/ SSI (0-15 units) TID AC + HS       MD- Please consider the following in-hospital insulin adjustments:  1. Reduce Lantus slightly to 12 units BID  2. Start Novolog Meal Coverage: Novolog 4 units TID with meals (hold if pt eats <50% of meal)      --Will follow patient during hospitalization--  Wyn Quaker RN, MSN, CDE Diabetes Coordinator Inpatient Glycemic Control Team Team Pager: [phone] (8a-5p)

## 2017-02-04 LAB — BASIC METABOLIC PANEL
Anion gap: 8 (ref 5–15)
BUN: 22 mg/dL — AB (ref 6–20)
CO2: 23 mmol/L (ref 22–32)
Calcium: 9.3 mg/dL (ref 8.9–10.3)
Chloride: 105 mmol/L (ref 101–111)
Creatinine, Ser: 1.24 mg/dL — ABNORMAL HIGH (ref 0.44–1.00)
GFR calc Af Amer: 46 mL/min — ABNORMAL LOW (ref >60)
GFR, EST NON AFRICAN AMERICAN: 40 mL/min — AB (ref >60)
GLUCOSE: 117 mg/dL — AB (ref 65–99)
POTASSIUM: 4.3 mmol/L (ref 3.5–5.1)
Sodium: 136 mmol/L (ref 135–145)

## 2017-02-04 LAB — GLUCOSE, CAPILLARY
GLUCOSE-CAPILLARY: 236 mg/dL — AB (ref 65–99)
Glucose-Capillary: 103 mg/dL — ABNORMAL HIGH (ref 65–99)

## 2017-02-04 LAB — CBC
HEMATOCRIT: 32.1 % — AB (ref 36.0–46.0)
Hemoglobin: 10.6 g/dL — ABNORMAL LOW (ref 12.0–15.0)
MCH: 30.3 pg (ref 26.0–34.0)
MCHC: 33 g/dL (ref 30.0–36.0)
MCV: 91.7 fL (ref 78.0–100.0)
Platelets: 264 10*3/uL (ref 150–400)
RBC: 3.5 MIL/uL — ABNORMAL LOW (ref 3.87–5.11)
RDW: 15.2 % (ref 11.5–15.5)
WBC: 11.6 10*3/uL — ABNORMAL HIGH (ref 4.0–10.5)

## 2017-02-04 MED ORDER — FOSFOMYCIN TROMETHAMINE 3 G PO PACK: 3.0000 g | PACK | Freq: Once | ORAL | 0 refills | Status: AC

## 2017-02-04 MED ORDER — FOSFOMYCIN TROMETHAMINE 3 G PO PACK
3.0000 g | PACK | Freq: Once | ORAL | Status: AC
  Administered 2017-02-04: 3 g via ORAL
  Filled 2017-02-04: qty 3

## 2017-02-04 NOTE — Discharge Summary (Signed)
Physician Discharge Summary  Carrie Atkins:[id number] DOB: [birth year] DOA: 01/31/2017  PCP: Leonard Downing, MD  Admit date: 01/31/2017 Discharge date: 02/04/2017  Admitted From: home Disposition:  home  Recommendations for Outpatient Follow-up:  1. Follow up with PCP in 1-2 weeks  Home Health: PT, RN, aide Equipment/Devices: none  Discharge Condition: stable CODE STATUS: DNR Diet recommendation: diabetic  HPI: Per Dr. Myna Hidalgo, Carrie Atkins is a 83 y.o. female with medical history significant for CLL followed by oncology with observation only, insulin-dependent diabetes mellitus, and recurrent UTI followed by urology, presenting to the emergency department with hyperglycemia and intermittent confusion. She is accompanied by her daughter who assists with the history. She had reportedly been in her usual state until she was noted to have persistent hyperglycemia over the past several days with CBGs in the 500s at home. She was evaluated by urology couple days ago on the clinic, reportedly had a CT that was negative for stone, but notable for gas in the bladder, and she was started on nitrofurantoin on 01/29/2017. She has continued to worsen in terms of her hyperglycemia and fatigue since that time. She has been sleeping a lot more, has been experiencing polyuria and polydipsia, and seems to be mildly confused intermittently per her daughter. No flank pain. No fevers or chills.  ED Course: Upon arrival to the ED, patient is found to be afebrile, saturating low 90s on room air, tachypneic up to 30, and with vitals otherwise stable. EKG features a sinus rhythm with early R-transition and chest x-rays negative for acute pulmonary disease. CTA PE study is negative for PE, but notable for emphysema with small effusions and bibasilar consolidation. Chemistry panel reveals a sodium of 132, bicarbonate 19, serum glucose 405, and creatinine 1.38, similar to priors. CBC is notable for a chronic  leukocytosis to 24,400 and a stable normocytic anemia with hemoglobin of 9.8. Urinalysis is suggestive of possible infection and is sent for culture. Beta hydroxybutyrate is normal. Patient was treated with 6 units NovoLog, 1 L normal saline, and 1 g IV Rocephin in the ED. She remained tachypneic and appears uncomfortable, but has been hemodynamically stable and not in any acute respiratory distress. She'll be admitted to the medical-surgical unit for ongoing evaluation and management of fatigue and intermittent confusion suspected secondary to recurrent UTI.   Hospital Course: Discharge Diagnoses:  Principal Problem:   Acute cystitis without hematuria Active Problems:   HTN (hypertension)   CLL (chronic lymphocytic leukemia) (HCC)   Hyperglycemia   Paroxysmal atrial fibrillation (HCC)   ESBL (extended spectrum beta-lactamase) producing bacteria infection   HCAP (healthcare-associated pneumonia)   Insulin dependent diabetes mellitus (HCC)   CKD (chronic kidney disease), stage III   UTI (urinary tract infection)   Recurrent UTI -Urine cultures have remained negative, likely due to the fact that she was on nitrofurantoin prior to being admitted. She was started on IV meropenem due to history of ESBL. She improved clinically, received 5 days of IV meropenem and given negative cultures her regimen was transitioned to oral fosfomycin for 3 more doses on discharge.  Questionable HCAP -no respiratory symptoms, unlikely pneumonia CLL -Chronic leukocytosis, followed by oncology as an outpatient Hypertension -Continue Coreg Depression/anxiety -Continue home medications Iron deficiency anemia -Hemoglobin stable Paroxysmal A. Fib -Continue beta-blockers, not on anticoagulation due to high fall risk   Discharge Instructions   Allergies as of 02/04/2017      Reactions   Sulfonamide Derivatives Nausea And Vomiting  Hydrocodone Nausea And Vomiting   Sulfa Antibiotics Nausea And Vomiting        Medication List    STOP taking these medications   amoxicillin 500 MG tablet Commonly known as:  AMOXIL   nitrofurantoin 50 MG capsule Commonly known as:  MACRODANTIN     TAKE these medications   aspirin EC 81 MG tablet Take 81 mg by mouth daily.   buPROPion 100 MG 12 hr tablet Commonly known as:  WELLBUTRIN SR Take 100 mg by mouth daily.   CALCIUM-MAGNESIUM-ZINC PO Take 2 tablets by mouth 2 (two) times daily.   carvedilol 3.125 MG tablet Commonly known as:  COREG Take 1 tablet (3.125 mg total) by mouth 2 (two) times daily with a meal. What changed:  Another medication with the same name was removed. Continue taking this medication, and follow the directions you see here.   ferrous sulfate 325 (65 FE) MG tablet Take 325 mg by mouth daily with breakfast.   fosfomycin 3 g Pack Commonly known as:  MONUROL Take 3 g by mouth once. Start taking on:  02/07/2017   Insulin Glargine 100 UNIT/ML Solostar Pen Commonly known as:  LANTUS Inject 15 Units into the skin daily at 10 pm. What changed:  how much to take   insulin lispro 100 UNIT/ML injection Commonly known as:  HUMALOG Inject 3-15 Units into the skin 3 (three) times daily before meals. Per sliding scale.   levothyroxine 25 MCG tablet Commonly known as:  SYNTHROID, LEVOTHROID Take 25 mcg by mouth daily.   nystatin cream Commonly known as:  MYCOSTATIN Apply 1 application topically daily as needed for dry skin.   ranitidine 150 MG tablet Commonly known as:  ZANTAC Take 150 mg by mouth 2 (two) times daily as needed for heartburn.   sertraline 100 MG tablet Commonly known as:  ZOLOFT Take 100 mg by mouth daily after breakfast.   TYLENOL ARTHRITIS PAIN 650 MG CR tablet Generic drug:  acetaminophen Take 1,300 mg by mouth 2 (two) times daily.            Discharge Care Instructions        Start     Ordered   02/07/17 0000  fosfomycin (MONUROL) 3 g PACK   Once     02/04/17 1025      Allergies   Allergen Reactions  . Sulfonamide Derivatives Nausea And Vomiting  . Hydrocodone Nausea And Vomiting  . Sulfa Antibiotics Nausea And Vomiting    Consultations:  None   Procedures/Studies:  Ct Angio Chest Pe W And/or Wo Contrast  Result Date: 01/31/2017 CLINICAL DATA:  Shortness of breath and hyperglycemia. History of chronic lymphocytic leukemia. EXAM: CT ANGIOGRAPHY CHEST WITH CONTRAST TECHNIQUE: Multidetector CT imaging of the chest was performed using the standard protocol during bolus administration of intravenous contrast. Multiplanar CT image reconstructions and MIPs were obtained to evaluate the vascular anatomy. CONTRAST:  80 mL Isovue 370 nonionic COMPARISON:  Chest radiograph January 31, 2017. FINDINGS: Cardiovascular: There is no demonstrable pulmonary embolus. There is no appreciable thoracic aortic aneurysm or dissection. There is calcification at the origin of the left common carotid artery. There are foci of calcification in the aorta. There are foci of coronary artery calcification. Pericardium is not appreciably thickened. Mediastinum/Nodes: Visualized thyroid appears unremarkable. There is adenopathy in the aortopulmonary window region. The largest lymph node in this area measures 1.3 x 1.1 cm. There is mild adenopathy in the left hilar region with largest lymph node in this  area measuring 1.1 x 1.1 cm. There is adenopathy in the right hilum with the largest lymph node in this area measuring 1.5 x 1.5 cm. There is sub- carinal adenopathy. The largest lymph node in the sub- carinal region of measures 2.0 x 1.9 cm. There is a nearby lymph node in the right sub- carinal region measuring 1.9 x 1.4 cm. There is no appreciable esophageal lesion. Lungs/Pleura: There is extensive underlying centrilobular and paraseptal emphysema. There are small pleural effusions bilaterally with patchy bibasilar atelectasis. There is effusion tracking along the right major fissure. There is no well-defined  airspace consolidation. Upper Abdomen: Visualized upper abdominal structures appear unremarkable except for absence of the gallbladder. Musculoskeletal:  There are no blastic or lytic bone lesions. Review of the MIP images confirms the above findings. IMPRESSION: 1.  No demonstrable pulmonary embolus. 2.  Aortic atherosclerosis.  Foci of coronary artery calcification. 3. Underlying emphysematous change. Small pleural effusions bilaterally with bibasilar consolidation. 4. Adenopathy at multiple sites. Patient has history of chronic lymphocytic leukemia; question adenopathy secondary to this entity. 5.  Gallbladder absent. Aortic Atherosclerosis (ICD10-I70.0) and Emphysema (ICD10-J43.9). Electronically Signed   By: Lowella Grip III M.D.   On: 01/31/2017 20:34   Dg Chest Portable 1 View  Result Date: 01/31/2017 CLINICAL DATA:  Weakness, shortness of breath, nausea. EXAM: PORTABLE CHEST 1 VIEW COMPARISON:  12/05/2016 FINDINGS: Cardiomediastinal silhouette is normal. Mediastinal contours appear intact. There is no evidence of focal airspace consolidation, pleural effusion or pneumothorax. Low lung volumes. Osseous structures are without acute abnormality. Soft tissues are grossly normal. IMPRESSION: No active disease. Electronically Signed   By: Fidela Salisbury M.D.   On: 01/31/2017 16:17   Dg Swallowing Func-speech Pathology  Result Date: 02/02/2017 Objective Swallowing Evaluation: Type of Study: MBS-Modified Barium Swallow Study Patient Details Name: Carrie Atkins MRN: [id number] Date of Birth: Mar 31, 1934 Today's Date: 02/02/2017 Time: SLP Start Time (ACUTE ONLY): 1215-SLP Stop Time (ACUTE ONLY): 1250 SLP Time Calculation (min) (ACUTE ONLY): 35 min Past Medical History: Past Medical History: Diagnosis Date . Chest pain  . Chronic lymphocytic leukemia ([place])  . CLL (chronic lymphocytic leukemia) ([place]) 06/05/2013 . Diabetes mellitus  . Hyperlipidemia  . Hypertension  . Leukemia ([place])  . SOB (shortness of  breath)  Past Surgical History: Past Surgical History: Procedure Laterality Date . CARDIOVASCULAR STRESS TEST  05/29/2010  EF 83% . CHOLECYSTECTOMY   . TONSILLECTOMY   . TOTAL KNEE ARTHROPLASTY   . US ECHOCARDIOGRAPHY  01/10/2007  EF 55-60% . US ECHOCARDIOGRAPHY  02/23/2006   EF 55-60% HPI: 83 yo female adm to [facility] with acute cystitis without hematuria.  PMH + for CLL- CXR showed emphysema but negative for pna.  Swallow evaluation ordered due to pt having cough after intake.  Pt admits to issues with swallowing stating at times that food will not "go down" - she indicates difficulties orally in pharynx and esophagus.  Pt admits to dysphagia x one month initially but then later denied - ? poor historian.   Subjective: pt in chair, reports need to use RR, faciliated care Assessment / Plan / Recommendation CHL IP CLINICAL IMPRESSIONS 02/02/2017 Clinical Impression Pt with mild oropharyngeal dysphagia without aspiration of any consistency tested.  She did appear to have prominent cricopharyngeus that resulted in backflow of liquids x1 to pyriform sinus region.  Mild residuals noted on tongue base x1 due to oral propulsive weakness without pt awareness but cued dry swallows helpful to clear.  Upon esophageal sweep, pt appeared with  slow clearance of esophagus - suspect dysmotility but radiologist not present to confirm. Intake of thin liquids facilitated clearance. Of note, pt did cough x1 at end of MBS when being positioned to be slid back into bed. She did NOT cough t/o entire MBSS while consuming barium.  Suspect primary esophageal dysphagia.  Using live video, educated pt to findings/recommendations.  Regular/thin Medicine with puree - start and follow with liquids SLP Visit Diagnosis Dysphagia, pharyngoesophageal phase (R13.14);Dysphagia, oropharyngeal phase (R13.12);Other (comment) Attention and concentration deficit following -- Frontal lobe and executive function deficit following -- Impact on safety and function  Moderate aspiration risk   CHL IP TREATMENT RECOMMENDATION 02/02/2017 Treatment Recommendations Therapy as outlined in treatment plan below   Prognosis 02/02/2017 Prognosis for Safe Diet Advancement Fair Barriers to Reach Goals Other (Comment) Barriers/Prognosis Comment -- CHL IP DIET RECOMMENDATION 02/02/2017 SLP Diet Recommendations Regular solids;Thin liquid Liquid Administration via -- Medication Administration Whole meds with puree Compensations Slow rate;Small sips/bites;Follow solids with liquid Postural Changes Remain semi-upright after after feeds/meals (Comment);Seated upright at 90 degrees   CHL IP OTHER RECOMMENDATIONS 02/02/2017 Recommended Consults -- Oral Care Recommendations Oral care BID Other Recommendations --   CHL IP FOLLOW UP RECOMMENDATIONS 02/02/2017 Follow up Recommendations (No Data)   CHL IP FREQUENCY AND DURATION 02/02/2017 Speech Therapy Frequency (ACUTE ONLY) min 1 x/week Treatment Duration 1 week      CHL IP ORAL PHASE 02/02/2017 Oral Phase Impaired Oral - Pudding Teaspoon -- Oral - Pudding Cup -- Oral - Honey Teaspoon -- Oral - Honey Cup -- Oral - Nectar Teaspoon -- Oral - Nectar Cup Weak lingual manipulation Oral - Nectar Straw -- Oral - Thin Teaspoon Weak lingual manipulation;Premature spillage Oral - Thin Cup Weak lingual manipulation;Premature spillage Oral - Thin Straw Weak lingual manipulation;Premature spillage Oral - Puree Weak lingual manipulation Oral - Mech Soft -- Oral - Regular Weak lingual manipulation Oral - Multi-Consistency -- Oral - Pill Weak lingual manipulation;Premature spillage Oral Phase - Comment --  CHL IP PHARYNGEAL PHASE 02/02/2017 Pharyngeal Phase Impaired Pharyngeal- Pudding Teaspoon -- Pharyngeal -- Pharyngeal- Pudding Cup -- Pharyngeal -- Pharyngeal- Honey Teaspoon -- Pharyngeal -- Pharyngeal- Honey Cup -- Pharyngeal -- Pharyngeal- Nectar Teaspoon WFL Pharyngeal -- Pharyngeal- Nectar Cup WFL Pharyngeal -- Pharyngeal- Nectar Straw -- Pharyngeal -- Pharyngeal-  Thin Teaspoon WFL Pharyngeal -- Pharyngeal- Thin Cup Delayed swallow initiation-vallecula;Penetration/Aspiration during swallow;Pharyngeal residue - valleculae Pharyngeal Material enters airway, remains ABOVE vocal cords then ejected out Pharyngeal- Thin Straw Penetration/Aspiration during swallow Pharyngeal Material enters airway, remains ABOVE vocal cords then ejected out Pharyngeal- Puree Reduced tongue base retraction;Pharyngeal residue - valleculae Pharyngeal -- Pharyngeal- Mechanical Soft -- Pharyngeal -- Pharyngeal- Regular WFL Pharyngeal -- Pharyngeal- Multi-consistency -- Pharyngeal -- Pharyngeal- Pill WFL Pharyngeal -- Pharyngeal Comment --  CHL IP CERVICAL ESOPHAGEAL PHASE 02/02/2017 Cervical Esophageal Phase Impaired Pudding Teaspoon -- Pudding Cup -- Honey Teaspoon -- Honey Cup -- Nectar Teaspoon WFL Nectar Cup [facility] Nectar Straw -- Thin Teaspoon Reduced cricopharyngeal relaxation Thin Cup Reduced cricopharyngeal relaxation;Esophageal backflow into the pharynx Thin Straw Reduced cricopharyngeal relaxation Puree WFL Mechanical Soft -- Regular WFL Multi-consistency -- Pill WFL Cervical Esophageal Comment pt did not sense backflowed material at pyriform sinus region,  backflow was only trace No flowsheet data found. Macario Golds 02/02/2017, 2:13 PM  [name], MS [facility] SLP [phone]               Subjective: - no chest pain, shortness of breath, no abdominal pain, nausea or vomiting.   Discharge  Exam: Vitals:   02/04/17 0447 02/04/17 1324  BP: (!) 153/65 (!) 146/54  Pulse: 72 71  Resp: 20 18  Temp: 98 F (36.7 C) 98.3 F (36.8 C)  SpO2: 97% 96%   Vitals:   02/03/17 1314 02/03/17 2009 02/04/17 0447 02/04/17 1324  BP: (!) 145/55 (!) 146/60 (!) 153/65 (!) 146/54  Pulse: 63 68 72 71  Resp: 18 18 20 18   Temp: 98.3 F (36.8 C) 98.7 F (37.1 C) 98 F (36.7 C) 98.3 F (36.8 C)  TempSrc: Oral Oral Oral Oral  SpO2: 98% 98% 97% 96%  Weight:   82.4 kg (181 lb 10.5 oz)   Height:          General: Pt is alert, awake, not in acute distress Cardiovascular: RRR, S1/S2 +, no rubs, no gallops Respiratory: CTA bilaterally, no wheezing, no rhonchi Abdominal: Soft, NT, ND, bowel sounds +    The results of significant diagnostics from this hospitalization (including imaging, microbiology, ancillary and laboratory) are listed below for reference.     Microbiology: Recent Results (from the past 240 hour(s))  Urine Culture     Status: Abnormal   Collection Time: 01/31/17  8:52 PM  Result Value Ref Range Status   Specimen Description URINE, RANDOM  Final   Special Requests NONE  Final   Culture MULTIPLE SPECIES PRESENT, SUGGEST RECOLLECTION (A)  Final   Report Status 02/02/2017 FINAL  Final  Culture, blood (routine x 2) Call MD if unable to obtain prior to antibiotics being given     Status: None (Preliminary result)   Collection Time: 02/01/17  1:12 AM  Result Value Ref Range Status   Specimen Description BLOOD LEFT HAND  Final   Special Requests IN PEDIATRIC BOTTLE Blood Culture adequate volume  Final   Culture   Final    NO GROWTH 3 DAYS Performed at [facility], [place] [address] [place], [place] [address]    Report Status PENDING  Incomplete  Culture, blood (routine x 2) Call MD if unable to obtain prior to antibiotics being given     Status: None (Preliminary result)   Collection Time: 02/01/17  1:12 AM  Result Value Ref Range Status   Specimen Description BLOOD RIGHT HAND  Final   Special Requests IN PEDIATRIC BOTTLE Blood Culture adequate volume  Final   Culture   Final    NO GROWTH 3 DAYS Performed at [facility], [place] [address] [place], [place] [address]    Report Status PENDING  Incomplete     Labs: BNP (last 3 results)  Recent Labs  12/04/16 1157  BNP 062.3*   Basic Metabolic Panel:  Recent Labs Lab 01/31/17 1549 01/31/17 1640 02/01/17 0112 02/02/17 1407 02/03/17 0630 02/04/17 0802  NA 132* 137 130* 134* 139 136  K 3.7 3.3*  4.0 4.6 4.2 4.3  CL 106 107 100* 103 108 105  CO2 19*  --  22 23 24 23   GLUCOSE 405* 364* 314* 238* 98 117*  BUN 34* 30* 30* 25* 24* 22*  CREATININE 1.38* 1.00 1.36* 1.21* 1.18* 1.24*  CALCIUM 7.4*  --  8.3* 8.7* 9.0 9.3   Liver Function Tests:  Recent Labs Lab 01/31/17 1549  AST 17  ALT 11*  ALKPHOS 49  BILITOT 0.7  PROT 5.7*  ALBUMIN 2.5*    Recent Labs Lab 01/31/17 1549  LIPASE 28   No results for input(s): AMMONIA in the last 168 hours. CBC:  Recent Labs Lab  01/31/17 1530 01/31/17 1640 02/01/17 0112 02/04/17 0802  WBC 24.4*  --  18.5* 11.6*  NEUTROABS  --   --  15.1*  --   HGB 9.8* 6.5* 8.8* 10.6*  HCT 29.2* 19.0* 26.3* 32.1*  MCV 90.7  --  91.3 91.7  PLT 199  --  164 264   Cardiac Enzymes: No results for input(s): CKTOTAL, CKMB, CKMBINDEX, TROPONINI in the last 168 hours. BNP: Invalid input(s): POCBNP CBG:  Recent Labs Lab 02/03/17 1139 02/03/17 1658 02/03/17 2012 02/04/17 0738 02/04/17 1110  GLUCAP 309* 145* 205* 103* 236*   D-Dimer No results for input(s): DDIMER in the last 72 hours. Hgb A1c No results for input(s): HGBA1C in the last 72 hours. Lipid Profile No results for input(s): CHOL, HDL, LDLCALC, TRIG, CHOLHDL, LDLDIRECT in the last 72 hours. Thyroid function studies No results for input(s): TSH, T4TOTAL, T3FREE, THYROIDAB in the last 72 hours.  Invalid input(s): FREET3 Anemia work up No results for input(s): VITAMINB12, FOLATE, FERRITIN, TIBC, IRON, RETICCTPCT in the last 72 hours. Urinalysis    Component Value Date/Time   COLORURINE YELLOW 01/31/2017 1504   APPEARANCEUR CLEAR 01/31/2017 1504   LABSPEC 1.015 01/31/2017 1504   PHURINE 6.0 01/31/2017 1504   GLUCOSEU >=500 (A) 01/31/2017 1504   HGBUR NEGATIVE 01/31/2017 1504   BILIRUBINUR NEGATIVE 01/31/2017 1504   KETONESUR NEGATIVE 01/31/2017 1504   PROTEINUR NEGATIVE 01/31/2017 1504   NITRITE NEGATIVE 01/31/2017 1504   LEUKOCYTESUR MODERATE (A) 01/31/2017 1504   Sepsis  Labs Invalid input(s): PROCALCITONIN,  WBC,  LACTICIDVEN Microbiology Recent Results (from the past 240 hour(s))  Urine Culture     Status: Abnormal   Collection Time: 01/31/17  8:52 PM  Result Value Ref Range Status   Specimen Description URINE, RANDOM  Final   Special Requests NONE  Final   Culture MULTIPLE SPECIES PRESENT, SUGGEST RECOLLECTION (A)  Final   Report Status 02/02/2017 FINAL  Final  Culture, blood (routine x 2) Call MD if unable to obtain prior to antibiotics being given     Status: None (Preliminary result)   Collection Time: 02/01/17  1:12 AM  Result Value Ref Range Status   Specimen Description BLOOD LEFT HAND  Final   Special Requests IN PEDIATRIC BOTTLE Blood Culture adequate volume  Final   Culture   Final    NO GROWTH 3 DAYS Performed at [facility], [place] [address], [place], [place] [address]    Report Status PENDING  Incomplete  Culture, blood (routine x 2) Call MD if unable to obtain prior to antibiotics being given     Status: None (Preliminary result)   Collection Time: 02/01/17  1:12 AM  Result Value Ref Range Status   Specimen Description BLOOD RIGHT HAND  Final   Special Requests IN PEDIATRIC BOTTLE Blood Culture adequate volume  Final   Culture   Final    NO GROWTH 3 DAYS Performed at [facility], [place] [address], [place], [place] [address]    Report Status PENDING  Incomplete     Time coordinating discharge: 32 minutes  SIGNED:  Marzetta Board, MD  [facility] 02/04/2017, 3:31 PM Pager [phone]  If 7PM-7AM, please contact night-coverage [email or web address] Password [email or web address]

## 2017-02-04 NOTE — Progress Notes (Addendum)
Date: February 04, 2017  Discharge orders review for case management needs. Pt will Need RN,PT, OT, and Aide: [facility] can not do until next Wedensday. [facility]Deb. Tucker-message left with need and please call back. [facility]/unable to take patient PCP Welton Flakes will not sign HHC orders. [facility]-referral given to answering service and RN WCB. [facility]/Referral left on messaging system and return call number.[phone]/[facility]-referral taken/information and orders faxed to [place], [place], [place], CCM:  [phone]

## 2017-02-04 NOTE — Discharge Instructions (Signed)
Follow with Carrie Downing, MD in 1-2 weeks  Please get a complete blood count and chemistry panel checked by your Primary MD at your next visit, and again as instructed by your Primary MD. Please get your medications reviewed and adjusted by your Primary MD.  Please request your Primary MD to go over all Hospital Tests and Procedure/Radiological results at the follow up, please get all Hospital records sent to your Prim MD by signing hospital release before you go home.  If you had Pneumonia of Lung problems at the Hospital: Please get a 2 view Chest X ray done in 6-8 weeks after hospital discharge or sooner if instructed by your Primary MD.  If you have Congestive Heart Failure: Please call your Cardiologist or Primary MD anytime you have any of the following symptoms:  1) 3 pound weight gain in 24 hours or 5 pounds in 1 week  2) shortness of breath, with or without a dry hacking cough  3) swelling in the hands, feet or stomach  4) if you have to sleep on extra pillows at night in order to breathe  Follow cardiac low salt diet and 1.5 lit/day fluid restriction.  If you have diabetes Accuchecks 4 times/day, Once in AM empty stomach and then before each meal. Log in all results and show them to your primary doctor at your next visit. If any glucose reading is under 80 or above 300 call your primary MD immediately.  If you have Seizure/Convulsions/Epilepsy: Please do not drive, operate heavy machinery, participate in activities at heights or participate in high speed sports until you have seen by Primary MD or a Neurologist and advised to do so again.  If you had Gastrointestinal Bleeding: Please ask your Primary MD to check a complete blood count within one week of discharge or at your next visit. Your endoscopic/colonoscopic biopsies that are pending at the time of discharge, will also need to followed by your Primary MD.  Get Medicines reviewed and adjusted. Please take all  your medications with you for your next visit with your Primary MD  Please request your Primary MD to go over all hospital tests and procedure/radiological results at the follow up, please ask your Primary MD to get all Hospital records sent to his/her office.  If you experience worsening of your admission symptoms, develop shortness of breath, life threatening emergency, suicidal or homicidal thoughts you must seek medical attention immediately by calling 911 or calling your MD immediately  if symptoms less severe.  You must read complete instructions/literature along with all the possible adverse reactions/side effects for all the Medicines you take and that have been prescribed to you. Take any new Medicines after you have completely understood and accpet all the possible adverse reactions/side effects.   Do not drive or operate heavy machinery when taking Pain medications.   Do not take more than prescribed Pain, Sleep and Anxiety Medications  Special Instructions: If you have smoked or chewed Tobacco  in the last 2 yrs please stop smoking, stop any regular Alcohol  and or any Recreational drug use.  Wear Seat belts while driving.  Please note You were cared for by a hospitalist during your hospital stay. If you have any questions about your discharge medications or the care you received while you were in the hospital after you are discharged, you can call the unit and asked to speak with the hospitalist on call if the hospitalist that took care of you is not available.  Once you are discharged, your primary care physician will handle any further medical issues. Please note that NO REFILLS for any discharge medications will be authorized once you are discharged, as it is imperative that you return to your primary care physician (or establish a relationship with a primary care physician if you do not have one) for your aftercare needs so that they can reassess your need for medications and monitor  your lab values.  You can reach the hospitalist office at phone [phone] or fax [phone]   If you do not have a primary care physician, you can call [phone] for a physician referral.  Activity: As tolerated with Full fall precautions use walker/cane & assistance as needed  Diet: diabetic  Disposition Home

## 2017-02-06 LAB — CULTURE, BLOOD (ROUTINE X 2)
CULTURE: NO GROWTH
Culture: NO GROWTH
SPECIAL REQUESTS: ADEQUATE
SPECIAL REQUESTS: ADEQUATE

## 2017-02-08 ENCOUNTER — Encounter: Payer: Self-pay | Admitting: Physician Assistant

## 2017-02-08 ENCOUNTER — Ambulatory Visit (INDEPENDENT_AMBULATORY_CARE_PROVIDER_SITE_OTHER): Payer: Medicare Other | Admitting: Physician Assistant

## 2017-02-08 VITALS — BP 138/80 | HR 60 | Ht 60.0 in | Wt 180.0 lb

## 2017-02-08 DIAGNOSIS — I10 Essential (primary) hypertension: Secondary | ICD-10-CM

## 2017-02-08 DIAGNOSIS — I5032 Chronic diastolic (congestive) heart failure: Secondary | ICD-10-CM

## 2017-02-08 DIAGNOSIS — I480 Paroxysmal atrial fibrillation: Secondary | ICD-10-CM

## 2017-02-08 MED ORDER — CARVEDILOL 3.125 MG PO TABS: 3.1250 mg | ORAL_TABLET | Freq: Two times a day (BID) | ORAL | 3 refills | Status: DC

## 2017-02-08 NOTE — Progress Notes (Signed)
Cardiology Office Note    Date:  02/08/2017   ID:  LASHAE Atkins, DOB Sep 01, 1933, MRN [id number]  PCP:  Carrie Downing, MD  Cardiologist:  Dr. Acie Atkins  Chief Complaint:  F/u on Afib   History of Present Illness:   Carrie Atkins is a 83 y.o. female with ahistory significant for mild LV outflow tract obstruction, HTN, CLL, PAF (not on anticoagulation due to fall risk)  and Diabetes Type II presented for afib follow up.   Initially dx with afib when she admitted 4/18 with DKA and fall due to rhabdomyolysis and UTI. Echo showed normal LVEF, mild LVH and grade 1 DD.   Admitted 01/2017 for acute cystitis. Treated with Abx. She was in sinus rhythm.   She is here for follow up. She continues to have DOE. Stable. Resolved with rest. She does feels intermittent palpitation. Once a month usually and resolves in few minutes. Occurs when she has UTI.  Compliant with low sodium diet. No chest pain, LE edema, melena, dizziness or syncope. Have intermittent orthopnea and PND.    Past Medical History:  Diagnosis Date  . Chest pain   . Chronic lymphocytic leukemia ([place])   . CLL (chronic lymphocytic leukemia) ([place]) 06/05/2013  . Diabetes mellitus   . Hyperlipidemia   . Hypertension   . Leukemia (Carrie Atkins)   . SOB (shortness of breath)     Past Surgical History:  Procedure Laterality Date  . CARDIOVASCULAR STRESS TEST  05/29/2010   EF 83%  . CHOLECYSTECTOMY    . TONSILLECTOMY    . TOTAL KNEE ARTHROPLASTY    . US ECHOCARDIOGRAPHY  01/10/2007   EF 55-60%  . US ECHOCARDIOGRAPHY  02/23/2006    EF 55-60%    Current Medications: Prior to Admission medications   Medication Sig Start Date End Date Taking? Authorizing Provider  acetaminophen (TYLENOL ARTHRITIS PAIN) 650 MG CR tablet Take 1,300 mg by mouth 2 (two) times daily.      [provider]  aspirin EC 81 MG tablet Take 81 mg by mouth daily.    [provider]  buPROPion (WELLBUTRIN SR) 100 MG 12 hr tablet Take 100  mg by mouth daily.  01/26/17   [provider]  CALCIUM-MAGNESIUM-ZINC PO Take 2 tablets by mouth 2 (two) times daily.     [provider]  carvedilol (COREG) 3.125 MG tablet Take 1 tablet (3.125 mg total) by mouth 2 (two) times daily with a meal. Patient not taking: Reported on 01/31/2017 12/09/16   Carrie Jansky, MD  ferrous sulfate 325 (65 FE) MG tablet Take 325 mg by mouth daily with breakfast.      [provider]  Insulin Glargine (LANTUS) 100 UNIT/ML Solostar Pen Inject 15 Units into the skin daily at 10 pm. Patient taking differently: Inject 17 Units into the skin daily at 10 pm.  12/09/16   Hongalgi, Carrie Dickinson, MD  insulin lispro (HUMALOG) 100 UNIT/ML injection Inject 3-15 Units into the skin 3 (three) times daily before meals. Per sliding scale.    [provider]  levothyroxine (SYNTHROID, LEVOTHROID) 25 MCG tablet Take 25 mcg by mouth daily. 01/25/17   [provider]  nystatin cream (MYCOSTATIN) Apply 1 application topically daily as needed for dry skin.    [provider]  ranitidine (ZANTAC) 150 MG tablet Take 150 mg by mouth 2 (two) times daily as needed for heartburn.    [provider]  sertraline (ZOLOFT) 100 MG tablet Take  100 mg by mouth daily after breakfast. 10/26/16   [provider]    Allergies:   Sulfonamide derivatives; Hydrocodone; and Sulfa antibiotics   Social History   Social History  . Marital status: Widowed    Spouse name: N/A  . Number of children: 1  . Years of education: 22   Social History Main Topics  . Smoking status: Never Smoker  . Smokeless tobacco: Never Used  . Alcohol use No  . Drug use: No  . Sexual activity: No   Other Topics Concern  . None   Social History Narrative   Lives in "mother in law suite" at her sons house   Drinks decaf     Family History:  The patient's family history includes COPD in her brother; Lung cancer in her father; Stroke in her mother.    ROS:   Please see the history of present illness.    ROS All other systems reviewed and are negative.   PHYSICAL EXAM:   VS:  BP 138/80   Pulse 60   Ht 5' (1.524 m)   Wt 180 lb (81.6 kg)   SpO2 96%   BMI 35.15 kg/m    GEN: Well nourished, well developed, in no acute distress  HEENT: normal  Neck: no JVD, carotid bruits, or masses Cardiac: RRR; no murmurs, rubs, or gallops,no edema  Respiratory:  clear to auscultation bilaterally, normal work of breathing GI: soft, nontender, nondistended, + BS MS: no deformity or atrophy  Skin: warm and dry, no rash Neuro:  Alert and Oriented x 3, Strength and sensation are intact Psych: euthymic mood, full affect  Wt Readings from Last 3 Encounters:  02/08/17 180 lb (81.6 kg)  02/04/17 181 lb 10.5 oz (82.4 kg)  12/09/16 182 lb 15.7 oz (83 kg)      Studies/Labs Reviewed:   EKG:  EKG is noted  ordered today.   Recent Labs: 12/04/2016: B Natriuretic Peptide 911.6; TSH 3.805 01/31/2017: ALT 11 02/04/2017: BUN 22; Creatinine, Ser 1.24; Hemoglobin 10.6; Platelets 264; Potassium 4.3; Sodium 136   Lipid Panel    Component Value Date/Time   CHOL 153 02/03/2011 0847   TRIG 163.0 (H) 02/03/2011 0847   HDL 34.90 (L) 02/03/2011 0847   CHOLHDL 4 02/03/2011 0847   VLDL 32.6 02/03/2011 0847   LDLCALC 86 02/03/2011 0847    Additional studies/ records that were reviewed today include:   As above    ASSESSMENT & PLAN:    1. PAF -Maintaining sinus rhythm on exam. Intermittent palpitation lasting for few minutes. Mostly associated with UTI. She was in sinus rhythm when she was admitted with UTI early this month. Not on anticoagulation due to fall risk. Continue BB.   2. HTN - Stable. Continue BB.   3. Chronic diastolic CHF - Euvolemic. DOE is stable, likely due to deconditioning.   Medication Adjustments/Labs and Tests Ordered: Current medicines are reviewed at length with the patient today.  Concerns regarding medicines are  outlined above.  Medication changes, Labs and Tests ordered today are listed in the Patient Instructions below. Patient Instructions  Medication Instructions:  Your physician recommends that you continue on your current medications as directed. Please refer to the Current Medication list given to you today.  Labwork: None   Testing/Procedures: None   Follow-Up: Your physician wants you to follow-up in: 12 months with DR NASHER. You will receive a reminder letter in the mail two months in advance. If you don't receive a letter, please  call our office to schedule the follow-up appointment.  Any Other Special Instructions Will Be Listed Below (If Applicable).  If you need a refill on your cardiac medications before your next appointment, please call your pharmacy.     Jarrett Soho, [place]  02/08/2017 2:07 PM    [facility] [place], [place], [place]  [address] Phone: [phone]; Fax: [phone]

## 2017-02-08 NOTE — Patient Instructions (Addendum)
Medication Instructions:  Your physician recommends that you continue on your current medications as directed. Please refer to the Current Medication list given to you today.  Labwork: None   Testing/Procedures: None   Follow-Up: Your physician wants you to follow-up in: 6 months with DR NASHER. You will receive a reminder letter in the mail two months in advance. If you don't receive a letter, please call our office to schedule the follow-up appointment.  Any Other Special Instructions Will Be Listed Below (If Applicable).  If you need a refill on your cardiac medications before your next appointment, please call your pharmacy.

## 2017-03-20 ENCOUNTER — Other Ambulatory Visit: Payer: Self-pay | Admitting: Cardiovascular Disease

## 2017-03-23 NOTE — Telephone Encounter (Signed)
Different sig denoted on medlist and rx request. Please advise

## 2017-08-20 ENCOUNTER — Encounter: Payer: Self-pay | Admitting: Endocrinology

## 2017-08-20 ENCOUNTER — Ambulatory Visit: Payer: Medicare Other | Admitting: Endocrinology

## 2017-08-20 VITALS — BP 162/90 | HR 83 | Wt 179.4 lb

## 2017-08-20 DIAGNOSIS — N183 Chronic kidney disease, stage 3 (moderate): Principal | ICD-10-CM

## 2017-08-20 DIAGNOSIS — E1022 Type 1 diabetes mellitus with diabetic chronic kidney disease: Secondary | ICD-10-CM

## 2017-08-20 DIAGNOSIS — R413 Other amnesia: Secondary | ICD-10-CM

## 2017-08-20 DIAGNOSIS — N189 Chronic kidney disease, unspecified: Secondary | ICD-10-CM

## 2017-08-20 MED ORDER — INSULIN GLARGINE 100 UNIT/ML SOLOSTAR PEN: 260.0000 [IU] | PEN_INJECTOR | SUBCUTANEOUS | 11 refills | Status: DC

## 2017-08-20 NOTE — Patient Instructions (Addendum)
good diet and exercise significantly improve the control of your diabetes.  please let me know if you wish to be referred to a dietician.  high blood sugar is very risky to your health.  you should see an eye doctor and dentist every year.  It is very important to get all recommended vaccinations.  Controlling your blood pressure and cholesterol drastically reduces the damage diabetes does to your body.  Those who smoke should quit.  Please discuss these with your doctor.  check your blood sugar 4 times a day: before the 3 meals, and at bedtime.  also check if you have symptoms of your blood sugar being too high or too low.  please keep a record of the readings and bring it to your next appointment here (or you can bring the meter itself).  You can write it on any piece of paper.  please call us sooner if your blood sugar goes below 70, or if you have a lot of readings over 200. For now, please: Increase lantus to 260 units each morning, and: Stop taking the humalog, and:  Please call or message [place] next week, to tell us how the blood sugar is doing.  Please come back for a follow-up appointment in 1 month.

## 2017-08-20 NOTE — Progress Notes (Signed)
Subjective:    Patient ID: Carrie Atkins, female    DOB: July 05, 1933, 84 y.o.   MRN: [id number]  HPI dtr provides hx, due to pt's memory loss.  pt is referred by Dr Arelia Sneddon, for diabetes.  Pt states DM was dx'ed in 2000; she has mild neuropathy of the lower extremities, and associated renal insuff; she has been on insulin since 2018, when she presented with DKA; pt says her diet and exercise are not good; she has never had GDM, pancreatitis, pancreatic surgery, or severe hypoglycemia.  She takes lantus 80/d, and humalog, 60 units tid.  Pt gives her own insulin.  A lantus pen lasts approx a few days, and humalog lasts less than 2 days.  she brings a record of her cbg's which I have reviewed today.  It varies from 200-500.  There is no trend throughout the day.  dtr requests to reduce frequency of injections.   Past Medical History:  Diagnosis Date  . Chest pain   . Chronic lymphocytic leukemia ([place])   . CLL (chronic lymphocytic leukemia) ([place]) 06/05/2013  . Diabetes mellitus   . Hyperlipidemia   . Hypertension   . Leukemia ([place])   . SOB (shortness of breath)     Past Surgical History:  Procedure Laterality Date  . CARDIOVASCULAR STRESS TEST  05/29/2010   EF 83%  . CHOLECYSTECTOMY    . TONSILLECTOMY    . TOTAL KNEE ARTHROPLASTY    . US ECHOCARDIOGRAPHY  01/10/2007   EF 55-60%  . US ECHOCARDIOGRAPHY  02/23/2006    EF 55-60%    Social History   Socioeconomic History  . Marital status: Widowed    Spouse name: Not on file  . Number of children: 1  . Years of education: 90  . Highest education level: Not on file  Social Needs  . Financial resource strain: Not on file  . Food insecurity - worry: Not on file  . Food insecurity - inability: Not on file  . Transportation needs - medical: Not on file  . Transportation needs - non-medical: Not on file  Occupational History  . Not on file  Tobacco Use  . Smoking status: Never Smoker  . Smokeless tobacco: Never Used  Substance and  Sexual Activity  . Alcohol use: No  . Drug use: No  . Sexual activity: No  Other Topics Concern  . Not on file  Social History Narrative   Lives in "mother in law suite" at her sons house   Drinks decaf    Current Outpatient Medications on File Prior to Visit  Medication Sig Dispense Refill  . acetaminophen (TYLENOL ARTHRITIS PAIN) 650 MG CR tablet Take 1,300 mg by mouth 2 (two) times daily.      Marland Kitchen aspirin EC 81 MG tablet Take 81 mg by mouth daily.    Marland Kitchen buPROPion (WELLBUTRIN SR) 100 MG 12 hr tablet Take 100 mg by mouth daily.     Marland Kitchen CALCIUM-MAGNESIUM-ZINC PO Take 2 tablets by mouth 2 (two) times daily.     . carvedilol (COREG) 3.125 MG tablet Take 1 tablet (3.125 mg total) by mouth 2 (two) times daily with a meal. 90 tablet 3  . ferrous sulfate 325 (65 FE) MG tablet Take 325 mg by mouth daily with breakfast.      . levothyroxine (SYNTHROID, LEVOTHROID) 25 MCG tablet Take 25 mcg by mouth daily.    Marland Kitchen nystatin cream (MYCOSTATIN) Apply 1 application topically daily as needed for dry skin.    Marland Kitchen  ranitidine (ZANTAC) 150 MG tablet Take 150 mg by mouth 2 (two) times daily as needed for heartburn.    . sertraline (ZOLOFT) 100 MG tablet Take 100 mg by mouth daily after breakfast.     No current facility-administered medications on file prior to visit.     Allergies  Allergen Reactions  . Sulfonamide Derivatives Nausea And Vomiting  . Hydrocodone Nausea And Vomiting  . Sulfa Antibiotics Nausea And Vomiting    Family History  Problem Relation Age of Onset  . Stroke Mother   . Lung cancer Father   . COPD Brother   . Diabetes Maternal Grandmother     BP (!) 162/90 (BP Location: Left Arm, Patient Position: Sitting, Cuff Size: Normal)   Pulse 83   Wt 179 lb 6.4 oz (81.4 kg)   SpO2 97%   BMI 35.04 kg/m    Review of Systems denies weight loss, blurry vision, headache, chest pain, sob, n/v, excessive diaphoresis, cold intolerance.  She has leg cramps, rhinorrhea, memory loss, cold  intolerance, easy bruising, and urinary frequency.  Depression is well-controlled.      Objective:   Physical Exam VS: see vs page GEN: no distress HEAD: head: no deformity eyes: no periorbital swelling, no proptosis external nose and ears are normal mouth: no lesion seen NECK: supple, thyroid is not enlarged CHEST WALL: no deformity LUNGS: clear to auscultation CV: reg rate and rhythm, no murmur ABD: abdomen is soft, nontender.  no hepatosplenomegaly.  not distended.  no hernia MUSCULOSKELETAL: muscle bulk and strength are grossly normal.  no obvious joint swelling.  gait is steady with a walker.   EXTEMITIES: no deformity.  no ulcer on the feet.  feet are of normal color and temp.  no edema PULSES: dorsalis pedis intact bilat.  no carotid bruit NEURO:  cn 2-12 grossly intact.   readily moves all 4's.  sensation is intact to touch on the feet SKIN:  Normal texture and temperature.  No rash or suspicious lesion is visible.   NODES:  None palpable at the neck PSYCH: alert, well-oriented.  Does not appear anxious nor depressed.  outside test results are reviewed: A1c=13.3%  Lab Results  Component Value Date   CREATININE 1.24 (H) 02/04/2017   BUN 22 (H) 02/04/2017   NA 136 02/04/2017   K 4.3 02/04/2017   CL 105 02/04/2017   CO2 23 02/04/2017   I personally reviewed electrocardiogram tracing (01/31/17): Indication: f/u AF Impression: NSR.  No MI.  No hypertrophy. Compared to 09/17/16: AF is resolved     Assessment & Plan:  Type 1 DM, with renal insuff: severe exacerbation. Memory loss: she needs a simpler insulin regimen.  Patient Instructions  good diet and exercise significantly improve the control of your diabetes.  please let me know if you wish to be referred to a dietician.  high blood sugar is very risky to your health.  you should see an eye doctor and dentist every year.  It is very important to get all recommended vaccinations.  Controlling your blood pressure and  cholesterol drastically reduces the damage diabetes does to your body.  Those who smoke should quit.  Please discuss these with your doctor.  check your blood sugar 4 times a day: before the 3 meals, and at bedtime.  also check if you have symptoms of your blood sugar being too high or too low.  please keep a record of the readings and bring it to your next appointment here (or you  can bring the meter itself).  You can write it on any piece of paper.  please call us sooner if your blood sugar goes below 70, or if you have a lot of readings over 200. For now, please: Increase lantus to 260 units each morning, and: Stop taking the humalog, and:  Please call or message [place] next week, to tell us how the blood sugar is doing.  Please come back for a follow-up appointment in 1 month.

## 2017-08-27 ENCOUNTER — Telehealth: Payer: Self-pay | Admitting: Endocrinology

## 2017-08-27 ENCOUNTER — Other Ambulatory Visit: Payer: Self-pay

## 2017-08-27 MED ORDER — INSULIN GLARGINE 100 UNIT/ML SOLOSTAR PEN: 350.0000 [IU] | PEN_INJECTOR | SUBCUTANEOUS | 11 refills | Status: DC

## 2017-08-27 NOTE — Telephone Encounter (Signed)
I called & spoke with patient's daughter Melynda Keller. I verified dosage & gave her dosage increase. I have also sent new prescription to pharmacy for 350 units.

## 2017-08-27 NOTE — Telephone Encounter (Signed)
Patient's daughter have my blood sugar readings:  AM: Lunch: Supper: Bedtime: 3/9  404 HI HI  HI 3/10 383 HI HI  HI 3/11 384 HI HI  HI 3/12 460 HI 536  HI  3/13 369  458  594 3/14 430 541 HI  HI 3/15 413  Please advise?

## 2017-08-27 NOTE — Telephone Encounter (Signed)
Please verify lantus is 260 units (almost 1 pen), each morning Then please increase lantus to 350 units each morning Please call or message [place] next week, to tell us how the blood sugar is doing

## 2017-08-27 NOTE — Telephone Encounter (Signed)
Carrie Atkins is calling to give patient b/s readings

## 2017-08-27 NOTE — Telephone Encounter (Signed)
Insulin Glargine (LANTUS) 100 UNIT/ML Solostar Pen  [facility] is calling about patients Insulin and have a few questions.    [phone]

## 2017-08-27 NOTE — Telephone Encounter (Signed)
I have called and clarified.

## 2017-09-03 ENCOUNTER — Other Ambulatory Visit: Payer: Self-pay

## 2017-09-03 ENCOUNTER — Telehealth: Payer: Self-pay | Admitting: Endocrinology

## 2017-09-03 MED ORDER — CARVEDILOL 3.125 MG PO TABS: 3.1250 mg | ORAL_TABLET | Freq: Two times a day (BID) | ORAL | 0 refills | Status: DC

## 2017-09-03 NOTE — Telephone Encounter (Signed)
Carrie Atkins is calling to give you patient b/s readings [phone]

## 2017-09-03 NOTE — Telephone Encounter (Signed)
I was able to speak with patient's daughter Dianna Limbo BS had been running from 400-HI on monitor. Dr. Loanne Drilling advised me to get patient to increase by 50 units then call Monday to see if blood sugars have come down. I also advised patient's daughter to make sure that she stays away from anything high carb/sugar & doesn't drink any sugar drinks.

## 2017-09-03 NOTE — Telephone Encounter (Signed)
I called LVM for Carrie Atkins to call back with BS readings.

## 2017-09-06 ENCOUNTER — Telehealth: Payer: Self-pay | Admitting: Endocrinology

## 2017-09-06 ENCOUNTER — Other Ambulatory Visit: Payer: Self-pay

## 2017-09-06 DIAGNOSIS — E1022 Type 1 diabetes mellitus with diabetic chronic kidney disease: Secondary | ICD-10-CM

## 2017-09-06 DIAGNOSIS — N183 Chronic kidney disease, stage 3 (moderate): Principal | ICD-10-CM

## 2017-09-06 MED ORDER — INSULIN GLARGINE 100 UNIT/ML SOLOSTAR PEN: 500.0000 [IU] | PEN_INJECTOR | SUBCUTANEOUS | 11 refills | Status: DC

## 2017-09-06 NOTE — Telephone Encounter (Signed)
Please increase insulin to 500 units qam.  Leave the needle in x 10 seconds after pushing the plunger down, to make sure you get it all.  I'll see you then.

## 2017-09-06 NOTE — Telephone Encounter (Signed)
Carrie Atkins is returning your call concerning pat b/s readings

## 2017-09-06 NOTE — Telephone Encounter (Signed)
These are blood sugars after increasing insulin Saturday morning:  AM: Lunch: Dinner Bedtime: 3/23 [phone] HI 3/24 487 HI HI 288 3/25 [phone]

## 2017-09-06 NOTE — Telephone Encounter (Signed)
Patient couldn't come 3/27 so I made her the first available with both you & Vaughan Basta. It's 4/10 at 2:30 & she will see Vaughan Basta at 2:45. I just need to know if you want her insulin to stay the same until she is seen?

## 2017-09-06 NOTE — Telephone Encounter (Signed)
I called and gave new dose to patient's daughter, Melynda Keller. I also sent in new prescription with higher dose to patent's pharmacy.

## 2017-09-06 NOTE — Telephone Encounter (Signed)
Please continue the same insulin for now. Please move up next ov to 09/08/17, 7:45 am Also, pt needs to see Vaughan Basta then.  I did referral.

## 2017-09-10 ENCOUNTER — Telehealth: Payer: Self-pay | Admitting: Endocrinology

## 2017-09-10 ENCOUNTER — Other Ambulatory Visit: Payer: Self-pay

## 2017-09-10 NOTE — Telephone Encounter (Signed)
A visit is needed to properly address this.

## 2017-09-10 NOTE — Telephone Encounter (Signed)
Carrie Atkins would like to speak with [name] re: patient. Ph# [phone]

## 2017-09-10 NOTE — Telephone Encounter (Signed)
These are blood sugars readings after increasing to 500 units:  AM: Lunch: Dinner: Bedtime: 3/26 397 Hi Hi Hi  3/27 355 Hi 501 497 3/28 306 321 524 528 3/29 383

## 2017-09-13 NOTE — Telephone Encounter (Signed)
I spoke with daughter, Melynda Keller & she decided just to keep their April 10th appt since they have one same day with Vaughan Basta. She had labs drawn today with PCP & daughter stated that she would call with those results when available.

## 2017-09-21 NOTE — Telephone Encounter (Signed)
error 

## 2017-09-22 ENCOUNTER — Encounter: Payer: Self-pay | Admitting: Endocrinology

## 2017-09-22 ENCOUNTER — Encounter: Payer: Self-pay | Admitting: Cardiovascular Disease

## 2017-09-22 ENCOUNTER — Ambulatory Visit: Payer: Medicare Other | Admitting: Endocrinology

## 2017-09-22 ENCOUNTER — Encounter: Payer: Medicare Other | Attending: Endocrinology | Admitting: Nutrition

## 2017-09-22 VITALS — BP 140/78 | HR 79 | Wt 177.6 lb

## 2017-09-22 DIAGNOSIS — N183 Chronic kidney disease, stage 3 unspecified: Secondary | ICD-10-CM

## 2017-09-22 DIAGNOSIS — Z713 Dietary counseling and surveillance: Secondary | ICD-10-CM | POA: Diagnosis not present

## 2017-09-22 DIAGNOSIS — E1022 Type 1 diabetes mellitus with diabetic chronic kidney disease: Secondary | ICD-10-CM | POA: Diagnosis not present

## 2017-09-22 DIAGNOSIS — R739 Hyperglycemia, unspecified: Secondary | ICD-10-CM

## 2017-09-22 LAB — POCT GLYCOSYLATED HEMOGLOBIN (HGB A1C): Hemoglobin A1C: 14.3

## 2017-09-22 MED ORDER — INSULIN GLARGINE 300 UNIT/ML ~~LOC~~ SOPN: 600.0000 [IU] | PEN_INJECTOR | SUBCUTANEOUS | 11 refills | Status: DC

## 2017-09-22 NOTE — Patient Instructions (Addendum)
check your blood sugar 4 times a day: before the 3 meals, and at bedtime.  also check if you have symptoms of your blood sugar being too high or too low.  please keep a record of the readings and bring it to your next appointment here (or you can bring the meter itself).  You can write it on any piece of paper.  please call us sooner if your blood sugar goes below 70, or if you have a lot of readings over 200. For now, please: change lantus to "toujeo," 600 units each morning, and: Please call or message [place] next week, to tell us how the blood sugar is doing.  Please come back for a follow-up appointment in 2 months.

## 2017-09-22 NOTE — Progress Notes (Signed)
Subjective:    Patient ID: Carrie Atkins, female    DOB: 12/16/1933, 84 y.o.   MRN: [id number]  HPI Pt returns for f/u of diabetes mellitus: DM type: 1 Dx'ed: 1607 Complications: polyneuropathy renal insuff Therapy: insulin since 2018 GDM: never DKA: once (2018) Severe hypoglycemia: never Pancreatitis: never Pancreatic imaging: never Other: dtr provides hx, due to pt's memory loss, but pt gives her own insulin; she takes qd insulin, after poor results with MDI Interval history:  Pt reported symptoms: denies n/v Pt takes meds as rx'ed.  she brings a record of her cbg's which I have reviewed today.  It varies from 236-600, despite increasing insulin to 500 units qam.  No recent steroids.   Past Medical History:  Diagnosis Date  . Chest pain   . Chronic lymphocytic leukemia ([place])   . CLL (chronic lymphocytic leukemia) ([place]) 06/05/2013  . Diabetes mellitus   . Hyperlipidemia   . Hypertension   . Leukemia ([place])   . SOB (shortness of breath)     Past Surgical History:  Procedure Laterality Date  . CARDIOVASCULAR STRESS TEST  05/29/2010   EF 83%  . CHOLECYSTECTOMY    . TONSILLECTOMY    . TOTAL KNEE ARTHROPLASTY    . US ECHOCARDIOGRAPHY  01/10/2007   EF 55-60%  . US ECHOCARDIOGRAPHY  02/23/2006    EF 55-60%    Social History   Socioeconomic History  . Marital status: Widowed    Spouse name: Not on file  . Number of children: 1  . Years of education: 106  . Highest education level: Not on file  Occupational History  . Not on file  Social Needs  . Financial resource strain: Not on file  . Food insecurity:    Worry: Not on file    Inability: Not on file  . Transportation needs:    Medical: Not on file    Non-medical: Not on file  Tobacco Use  . Smoking status: Never Smoker  . Smokeless tobacco: Never Used  Substance and Sexual Activity  . Alcohol use: No  . Drug use: No  . Sexual activity: Never  Lifestyle  . Physical activity:    Days per week: Not on  file    Minutes per session: Not on file  . Stress: Not on file  Relationships  . Social connections:    Talks on phone: Not on file    Gets together: Not on file    Attends religious service: Not on file    Active member of club or organization: Not on file    Attends meetings of clubs or organizations: Not on file    Relationship status: Not on file  . Intimate partner violence:    Fear of current or ex partner: Not on file    Emotionally abused: Not on file    Physically abused: Not on file    Forced sexual activity: Not on file  Other Topics Concern  . Not on file  Social History Narrative   Lives in "mother in law suite" at her sons house   Drinks decaf    Current Outpatient Medications on File Prior to Visit  Medication Sig Dispense Refill  . acetaminophen (TYLENOL ARTHRITIS PAIN) 650 MG CR tablet Take 1,300 mg by mouth 2 (two) times daily.      Marland Kitchen aspirin EC 81 MG tablet Take 81 mg by mouth daily.    Marland Kitchen buPROPion (WELLBUTRIN SR) 100 MG 12 hr tablet Take 100 mg  by mouth daily.     Marland Kitchen CALCIUM-MAGNESIUM-ZINC PO Take 2 tablets by mouth 2 (two) times daily.     . carvedilol (COREG) 3.125 MG tablet Take 1 tablet (3.125 mg total) by mouth 2 (two) times daily with a meal. 90 tablet 0  . Cranberry 450 MG CAPS Take 1 tablet by mouth 2 (two) times daily.    . ferrous sulfate 325 (65 FE) MG tablet Take 325 mg by mouth daily with breakfast.      . levothyroxine (SYNTHROID, LEVOTHROID) 25 MCG tablet Take 25 mcg by mouth daily.    . ranitidine (ZANTAC) 150 MG tablet Take 150 mg by mouth 2 (two) times daily as needed for heartburn.    . sertraline (ZOLOFT) 100 MG tablet Take 100 mg by mouth daily after breakfast.    . trimethoprim (TRIMPEX) 100 MG tablet Take 100 mg by mouth 2 (two) times daily.    Marland Kitchen nystatin cream (MYCOSTATIN) Apply 1 application topically daily as needed for dry skin.     No current facility-administered medications on file prior to visit.     Allergies  Allergen  Reactions  . Sulfonamide Derivatives Nausea And Vomiting  . Hydrocodone Nausea And Vomiting  . Sulfa Antibiotics Nausea And Vomiting    Family History  Problem Relation Age of Onset  . Stroke Mother   . Lung cancer Father   . COPD Brother   . Diabetes Maternal Grandmother     BP 140/78 (BP Location: Left Arm, Patient Position: Sitting, Cuff Size: Normal)   Pulse 79   Wt 177 lb 9.6 oz (80.6 kg)   SpO2 96%   BMI 34.69 kg/m   Review of Systems She denies hypoglycemia.      Objective:   Physical Exam VITAL SIGNS:  See vs page GENERAL: no distress. Pulses: dorsalis pedis intact bilat.   MSK: no deformity of the feet CV: no leg edema Skin:  no ulcer on the feet.  normal color and temp on the feet. Neuro: sensation is intact to touch on the feet.   A1c=14.3%    Assessment & Plan:  Type 1 DM, with severe insulin resistance.  Pt reviews insulin injection technique with Vaughan Basta, and she is determined to have proper injection technique.    Patient Instructions  check your blood sugar 4 times a day: before the 3 meals, and at bedtime.  also check if you have symptoms of your blood sugar being too high or too low.  please keep a record of the readings and bring it to your next appointment here (or you can bring the meter itself).  You can write it on any piece of paper.  please call us sooner if your blood sugar goes below 70, or if you have a lot of readings over 200. For now, please: change lantus to "toujeo," 600 units each morning, and: Please call or message [place] next week, to tell us how the blood sugar is doing.  Please come back for a follow-up appointment in 2 months.

## 2017-09-23 ENCOUNTER — Ambulatory Visit: Payer: Medicare Other | Admitting: Endocrinology

## 2017-09-25 NOTE — Patient Instructions (Signed)
Rotate sites in insulin injections to upper abdomens as well, continuing to give injections at lease 2 inches apart.

## 2017-09-25 NOTE — Progress Notes (Signed)
Patient's daughter is drawing up the Lantus dose, with 8 pens,of 60u each, and one pen of 20u.  She will then calculate the doseage remaining in the pens to determine how much her mother needs at the next injection.  Patient describes her injection sites as lower abdomen, with rotation of at least 2-3 inches between each injection.  Reports some insulin leakage, "but not much".   Showed her areas on her upper abdomen that are acceptable sites of injection, as well as upper arm areas.  She can not reach the arms, so would prefer the abdomen, due to ease of accessibility.

## 2017-09-27 ENCOUNTER — Ambulatory Visit: Payer: Medicare Other | Admitting: Cardiovascular Disease

## 2017-09-27 ENCOUNTER — Encounter: Payer: Self-pay | Admitting: Cardiovascular Disease

## 2017-09-27 VITALS — BP 132/82 | HR 76 | Ht 60.0 in | Wt 176.8 lb

## 2017-09-27 DIAGNOSIS — I10 Essential (primary) hypertension: Secondary | ICD-10-CM

## 2017-09-27 NOTE — Patient Instructions (Signed)
Medication Instructions:  Your physician recommends that you continue on your current medications as directed. Please refer to the Current Medication list given to you today.   Labwork: None Ordered   Testing/Procedures: None Ordered   Follow-Up: Your physician wants you to follow-up in: 1 year with Dr. Nahser.  You will receive a reminder letter in the mail two months in advance. If you don't receive a letter, please call our office to schedule the follow-up appointment.   If you need a refill on your cardiac medications before your next appointment, please call your pharmacy.   Thank you for choosing [facility]! Varie Machamer, RN [phone]    

## 2017-09-27 NOTE — Progress Notes (Signed)
Cardiology Office Note   Date:  09/27/2017   ID:  Carrie Atkins, DOB Aug 28, 1933, MRN [id number]  PCP:  Leonard Downing, MD  Cardiologist:   Mertie Moores, MD   Chief Complaint  Patient presents with  . Hypertension  . Atrial Fibrillation   84 y.o.  female with   a history of hypertension and hyperlipidemia. She has mild dyspnea on exertion but otherwise no significant shortness of breath or chest pain.  Jan. 30, 2015:  Carrie Atkins is seen after a 2 1/2 year absence. Her BP readings have been better that today. She has had some worsening dyspnea. Has DOE. Has to sit down to rest after doing any type of exertion. She ran out of energy while shopping at [place] several weeks ago.  She denies any angina-like chest pain. She has occasional nonspecific atypical pains. These pains are not associated with exertion.   Oct 18, 2014:  Carrie Atkins is a 84 y.o. female who presents for  Her HTN and HCM. She has been falling recently .  Has poor balance when walking.  Is very weak.  No CP , no dyspnea  No walking much due to balance issues.  Has had imbalance for the past year , has fallen 3 times this year.   April 25 , 2017:  Seen back after a year absence.   Saw Dr. Darron Doom at [facility] . Feeling well  Has good days and bad days  Had an echo in Jan. 2017  Normal LV systolic function, grade 1 diastolic CHF.  No CP , Does have DOE - doing household chores   Loses her balance frequently  - walks with a walker most of the time now   September 27, 2017:  Seen for HTN and HLD . DM has been poorly controlled.   Taking 600 units of insulin a day  Rare episodes of atypical CP  DOE but she doesn't exercise at all    Past Medical History:  Diagnosis Date  . Chest pain   . Chronic lymphocytic leukemia ([place])   . CLL (chronic lymphocytic leukemia) ([place]) 06/05/2013  . Diabetes mellitus   . Hyperlipidemia   . Hypertension   . Leukemia ([place])   . SOB (shortness of breath)       Past Surgical History:  Procedure Laterality Date  . CARDIOVASCULAR STRESS TEST  05/29/2010   EF 83%  . CHOLECYSTECTOMY    . TONSILLECTOMY    . TOTAL KNEE ARTHROPLASTY    . US ECHOCARDIOGRAPHY  01/10/2007   EF 55-60%  . US ECHOCARDIOGRAPHY  02/23/2006    EF 55-60%     Current Outpatient Medications  Medication Sig Dispense Refill  . acetaminophen (TYLENOL ARTHRITIS PAIN) 650 MG CR tablet Take 1,300 mg by mouth 2 (two) times daily.      Marland Kitchen aspirin EC 81 MG tablet Take 81 mg by mouth daily.    Marland Kitchen buPROPion (WELLBUTRIN SR) 100 MG 12 hr tablet Take 100 mg by mouth daily.     Marland Kitchen CALCIUM-MAGNESIUM-ZINC PO Take 2 tablets by mouth 2 (two) times daily.     . carvedilol (COREG) 3.125 MG tablet Take 1 tablet (3.125 mg total) by mouth 2 (two) times daily with a meal. 90 tablet 0  . Cranberry 450 MG CAPS Take 1 tablet by mouth 2 (two) times daily.    . ferrous sulfate 325 (65 FE) MG tablet Take 325 mg by mouth daily with breakfast.      .  Insulin Glargine (TOUJEO SOLOSTAR) 300 UNIT/ML SOPN Inject 600 Units into the skin every morning. And pen needles 1/day 30 pen 11  . levothyroxine (SYNTHROID, LEVOTHROID) 25 MCG tablet Take 25 mcg by mouth daily.    Marland Kitchen nystatin cream (MYCOSTATIN) Apply 1 application topically daily as needed for dry skin.    . ranitidine (ZANTAC) 150 MG tablet Take 150 mg by mouth 2 (two) times daily as needed for heartburn.    . sertraline (ZOLOFT) 100 MG tablet Take 100 mg by mouth daily after breakfast.    . trimethoprim (TRIMPEX) 100 MG tablet Take 100 mg by mouth 2 (two) times daily.     No current facility-administered medications for this visit.     Allergies:   Sulfonamide derivatives; Hydrocodone; and Sulfa antibiotics    Social History:  The patient  reports that she has never smoked. She has never used smokeless tobacco. She reports that she does not drink alcohol or use drugs.   Family History:  The patient's family history includes COPD in her brother; Diabetes  in her maternal grandmother; Lung cancer in her father; Stroke in her mother.    ROS:   Noted in current history, all other review of systems are negative.   Physical Exam: Blood pressure 132/82, pulse 76, height 5' (1.524 m), weight 176 lb 12.8 oz (80.2 kg), SpO2 98 %.  GEN:  Well nourished, well developed in no acute distress HEENT: Normal NECK: No JVD; No carotid bruits LYMPHATICS: No lymphadenopathy CARDIAC: RRR, soft systolic murmur  RESPIRATORY:  Clear to auscultation without rales, wheezing or rhonchi  ABDOMEN: Soft, non-tender, non-distended MUSCULOSKELETAL:  No edema; No deformity  SKIN: Warm and dry NEUROLOGIC:  Alert and oriented x 3   EKG:     Recent Labs: 12/04/2016: B Natriuretic Peptide 911.6; TSH 3.805 01/31/2017: ALT 11 02/04/2017: BUN 22; Creatinine, Ser 1.24; Hemoglobin 10.6; Platelets 264; Potassium 4.3; Sodium 136    Lipid Panel    Component Value Date/Time   CHOL 153 02/03/2011 0847   TRIG 163.0 (H) 02/03/2011 0847   HDL 34.90 (L) 02/03/2011 0847   CHOLHDL 4 02/03/2011 0847   VLDL 32.6 02/03/2011 0847   LDLCALC 86 02/03/2011 0847      Wt Readings from Last 3 Encounters:  09/27/17 176 lb 12.8 oz (80.2 kg)  09/22/17 177 lb 9.6 oz (80.6 kg)  08/20/17 179 lb 6.4 oz (81.4 kg)      Other studies Reviewed: Additional studies/ records that were reviewed today include: . Review of the above records demonstrates:   ASSESSMENT AND PLAN:  1.  Hypertension:   BP is well controlled.   Continue meds  2. Hyperlipidemia:  Managed by endocrinology   3. Left ventricular hypertrophy: She has a history of mild LVH. There is no clinical  evidence of dynamic obstruction.    Current medicines are reviewed at length with the patient today.  The patient does not have concerns regarding medicines.  The following changes have been made:  no change  Labs/ tests ordered today include:   No orders of the defined types were placed in this  encounter.    Disposition:   FU with me in 1 year       Mertie Moores, MD  09/27/2017 4:42 PM    [facility] [address], [place], [place]  [address] Phone: [phone]; Fax: [phone]

## 2017-10-06 ENCOUNTER — Telehealth: Payer: Self-pay | Admitting: Endocrinology

## 2017-10-06 ENCOUNTER — Other Ambulatory Visit: Payer: Self-pay

## 2017-10-06 MED ORDER — INSULIN GLARGINE 300 UNIT/ML ~~LOC~~ SOPN: 800.0000 [IU] | PEN_INJECTOR | SUBCUTANEOUS | 11 refills | Status: DC

## 2017-10-06 NOTE — Telephone Encounter (Signed)
Patient is calling to give b/s readings

## 2017-10-06 NOTE — Telephone Encounter (Signed)
Patient's Blood Sugars since starting Toujeo on Thursday:   M L D B 4/18 320 523 HI 518 4/19 332 491 291 HI 4/20 425 504 520 484 4/21 351 560  555  4/22 263 423 469 500 4/23 363 470 499 557 4/24 449 417  Patient's daughter stated they had stopped doing that milk & cereal for breakfast. She stated they were doing oatmeal & waffles with sugar free syrup. I advised that maybe they should try eggs/egg whites with a meat. Try high protein because her breakfast is still very carb heavy. Please advise?

## 2017-10-06 NOTE — Telephone Encounter (Signed)
I spoke with patient's daughter Melynda Keller & told her to try increasing to 800 units. She stated that she would do so & let us know how CBG's were in about a week. I have also resent prescription.

## 2017-10-06 NOTE — Telephone Encounter (Signed)
Ok, please increase to 800 units qam Please call or message [place] next week, to tell us how the blood sugar is doing

## 2017-10-12 ENCOUNTER — Telehealth: Payer: Self-pay | Admitting: Endocrinology

## 2017-10-12 NOTE — Telephone Encounter (Signed)
[facility] called re: received clinical notes, however patient's insurance is requiring for the clinical notes to be current. Blood glucose testing and insulin injections per day for the patient should be indicated. The form they received was not signed and dated. Please fax updated clinical notes to fax# [phone]

## 2017-10-13 NOTE — Telephone Encounter (Signed)
I have printed & faxed paperwork back to [facility].

## 2017-10-25 ENCOUNTER — Encounter: Payer: Self-pay | Admitting: Endocrinology

## 2017-10-25 ENCOUNTER — Ambulatory Visit: Payer: Medicare Other | Admitting: Endocrinology

## 2017-10-25 VITALS — BP 144/86 | HR 104 | Wt 180.0 lb

## 2017-10-25 DIAGNOSIS — N183 Chronic kidney disease, stage 3 (moderate): Secondary | ICD-10-CM

## 2017-10-25 DIAGNOSIS — E1022 Type 1 diabetes mellitus with diabetic chronic kidney disease: Secondary | ICD-10-CM

## 2017-10-25 MED ORDER — DULAGLUTIDE 0.75 MG/0.5ML ~~LOC~~ SOAJ: 0.7500 mg | SUBCUTANEOUS | 11 refills | Status: DC

## 2017-10-25 MED ORDER — INSULIN GLARGINE 300 UNIT/ML ~~LOC~~ SOPN: 800.0000 [IU] | PEN_INJECTOR | SUBCUTANEOUS | 11 refills | Status: DC

## 2017-10-25 NOTE — Progress Notes (Signed)
Subjective:    Patient ID: Carrie Atkins, female    DOB: Jan 04, 1934, 84 y.o.   MRN: [id number]  HPI Pt returns for f/u of diabetes mellitus: DM type: 1 Dx'ed: 1751 Complications: polyneuropathy and renal insuff Therapy: insulin since 2018 GDM: never DKA: once (2018) Severe hypoglycemia: never. Pancreatitis: never Pancreatic imaging: never Other: dtr provides hx, due to pt's memory loss, but pt gives her own insulin; she takes qd insulin, after poor results with MDI; proper injection technique was verified with CDE.   Interval history: Pt takes meds as rx'ed.  she brings a record of her cbg's which I have reviewed today.  It varies from 300-600, despite increasing toujeo to 800 units qam.  No recent steroids.   Past Medical History:  Diagnosis Date  . Chest pain   . Chronic lymphocytic leukemia ([place])   . CLL (chronic lymphocytic leukemia) ([place]) 06/05/2013  . Diabetes mellitus   . Hyperlipidemia   . Hypertension   . Leukemia ([place])   . SOB (shortness of breath)     Past Surgical History:  Procedure Laterality Date  . CARDIOVASCULAR STRESS TEST  05/29/2010   EF 83%  . CHOLECYSTECTOMY    . TONSILLECTOMY    . TOTAL KNEE ARTHROPLASTY    . US ECHOCARDIOGRAPHY  01/10/2007   EF 55-60%  . US ECHOCARDIOGRAPHY  02/23/2006    EF 55-60%    Social History   Socioeconomic History  . Marital status: Widowed    Spouse name: Not on file  . Number of children: 1  . Years of education: 37  . Highest education level: Not on file  Occupational History  . Not on file  Social Needs  . Financial resource strain: Not on file  . Food insecurity:    Worry: Not on file    Inability: Not on file  . Transportation needs:    Medical: Not on file    Non-medical: Not on file  Tobacco Use  . Smoking status: Never Smoker  . Smokeless tobacco: Never Used  Substance and Sexual Activity  . Alcohol use: No  . Drug use: No  . Sexual activity: Never  Lifestyle  . Physical activity:    Days  per week: Not on file    Minutes per session: Not on file  . Stress: Not on file  Relationships  . Social connections:    Talks on phone: Not on file    Gets together: Not on file    Attends religious service: Not on file    Active member of club or organization: Not on file    Attends meetings of clubs or organizations: Not on file    Relationship status: Not on file  . Intimate partner violence:    Fear of current or ex partner: Not on file    Emotionally abused: Not on file    Physically abused: Not on file    Forced sexual activity: Not on file  Other Topics Concern  . Not on file  Social History Narrative   Lives in "mother in law suite" at her sons house   Drinks decaf    Current Outpatient Medications on File Prior to Visit  Medication Sig Dispense Refill  . acetaminophen (TYLENOL ARTHRITIS PAIN) 650 MG CR tablet Take 1,300 mg by mouth 2 (two) times daily.      Marland Kitchen aspirin EC 81 MG tablet Take 81 mg by mouth daily.    Marland Kitchen buPROPion (WELLBUTRIN SR) 100 MG 12 hr  tablet Take 100 mg by mouth daily.     Marland Kitchen CALCIUM-MAGNESIUM-ZINC PO Take 2 tablets by mouth 2 (two) times daily.     . carvedilol (COREG) 3.125 MG tablet Take 1 tablet (3.125 mg total) by mouth 2 (two) times daily with a meal. 90 tablet 0  . Cranberry 450 MG CAPS Take 1 tablet by mouth 2 (two) times daily.    . ferrous sulfate 325 (65 FE) MG tablet Take 325 mg by mouth daily with breakfast.      . levothyroxine (SYNTHROID, LEVOTHROID) 25 MCG tablet Take 25 mcg by mouth daily.    . ranitidine (ZANTAC) 150 MG tablet Take 150 mg by mouth 2 (two) times daily as needed for heartburn.    . trimethoprim (TRIMPEX) 100 MG tablet Take 100 mg by mouth 2 (two) times daily.    Marland Kitchen nystatin cream (MYCOSTATIN) Apply 1 application topically daily as needed for dry skin.    Marland Kitchen sertraline (ZOLOFT) 100 MG tablet Take 100 mg by mouth daily after breakfast.     No current facility-administered medications on file prior to visit.      Allergies  Allergen Reactions  . Sulfonamide Derivatives Nausea And Vomiting  . Hydrocodone Nausea And Vomiting  . Sulfa Antibiotics Nausea And Vomiting    Family History  Problem Relation Age of Onset  . Stroke Mother   . Lung cancer Father   . COPD Brother   . Diabetes Maternal Grandmother     BP (!) 144/86   Pulse (!) 104   Wt 180 lb (81.6 kg)   SpO2 94%   BMI 35.15 kg/m    Review of Systems No weight change.      Objective:   Physical Exam VITAL SIGNS:  See vs page GENERAL: no distress Pulses: foot pulses are intact bilaterally.   MSK: no deformity of the feet or ankles.  CV: trace bilat edema of the legs.  Skin:  no ulcer on the feet or ankles.  normal color and temp on the feet and ankles Neuro: sensation is intact to touch on the feet and ankles.   Ext: There is bilateral onychomycosis of the toenails.   Lab Results  Component Value Date   CREATININE 1.24 (H) 02/04/2017   BUN 22 (H) 02/04/2017   NA 136 02/04/2017   K 4.3 02/04/2017   CL 105 02/04/2017   CO2 23 02/04/2017       Assessment & Plan:  Insulin-requiring type 2 DM, with renal insuff. severe insulin resistance: we discussed increasing insulin vs adding another med.  She'll add another med. HTN: is noted today.    Patient Instructions  check your blood sugar 4 times a day: before the 3 meals, and at bedtime.  also check if you have symptoms of your blood sugar being too high or too low.  please keep a record of the readings and bring it to your next appointment here (or you can bring the meter itself).  You can write it on any piece of paper.  please call us sooner if your blood sugar goes below 70, or if you have a lot of readings over 200. Your blood pressure is high today.  Please see your primary care provider soon, to have it rechecked.   Please continue the same toujeo, and: I have sent a prescription to your pharmacy, to add "trulicity." Please call or message [place] next week, to tell  us how the blood sugar is doing.  We can double  the trulicity if we need to.  Drinking plenty of fluids also helps the blood sugar.   Please come back for a follow-up appointment in 2-3 weeks.  Please have a dietician appointment the same day

## 2017-10-25 NOTE — Patient Instructions (Addendum)
check your blood sugar 4 times a day: before the 3 meals, and at bedtime.  also check if you have symptoms of your blood sugar being too high or too low.  please keep a record of the readings and bring it to your next appointment here (or you can bring the meter itself).  You can write it on any piece of paper.  please call us sooner if your blood sugar goes below 70, or if you have a lot of readings over 200. Your blood pressure is high today.  Please see your primary care provider soon, to have it rechecked.   Please continue the same toujeo, and: I have sent a prescription to your pharmacy, to add "trulicity." Please call or message [place] next week, to tell us how the blood sugar is doing.  We can double the trulicity if we need to.  Drinking plenty of fluids also helps the blood sugar.   Please come back for a follow-up appointment in 2-3 weeks.  Please have a dietician appointment the same day

## 2017-11-05 ENCOUNTER — Telehealth: Payer: Self-pay | Admitting: Endocrinology

## 2017-11-05 MED ORDER — DULAGLUTIDE 1.5 MG/0.5ML ~~LOC~~ SOAJ: 1.5000 mg | SUBCUTANEOUS | 11 refills | Status: DC

## 2017-11-05 NOTE — Telephone Encounter (Signed)
I called & LVM for Berta to call back to give BS readings.

## 2017-11-05 NOTE — Telephone Encounter (Signed)
They would like a call back to give patients latest blood sugar readings  Please advise

## 2017-11-05 NOTE — Telephone Encounter (Signed)
I have sent a prescription to your pharmacy, to double the trulicity Please continue the same insulin Please call or message [place] next week, to tell us how the blood sugar is doing

## 2017-11-05 NOTE — Telephone Encounter (Signed)
Blood Sugar Readings:  B L D B 5/18 [phone] 5/19 [phone]  5/20 [phone] 5/21 451  523 5/22 [phone] 5/23 338  [phone] 5/24 338 444

## 2017-11-05 NOTE — Telephone Encounter (Signed)
Carrie Atkins (daughter) returning your call. Please call ph# [phone]

## 2017-11-09 ENCOUNTER — Other Ambulatory Visit: Payer: Self-pay

## 2017-11-09 MED ORDER — DULAGLUTIDE 1.5 MG/0.5ML ~~LOC~~ SOAJ: 1.5000 mg | SUBCUTANEOUS | 11 refills | Status: DC

## 2017-11-09 NOTE — Telephone Encounter (Signed)
I called & spoke with patient's daughter that trulicity was doubled. I have resent script to [facility].

## 2017-11-15 ENCOUNTER — Telehealth: Payer: Self-pay | Admitting: Endocrinology

## 2017-11-15 ENCOUNTER — Other Ambulatory Visit: Payer: Self-pay | Admitting: Physician Assistant

## 2017-11-15 NOTE — Telephone Encounter (Signed)
Patient would like to give lastes blood sugar readings.  Please advise

## 2017-11-16 ENCOUNTER — Telehealth: Payer: Self-pay | Admitting: Endocrinology

## 2017-11-16 MED ORDER — GLIMEPIRIDE 2 MG PO TABS: 2.0000 mg | ORAL_TABLET | Freq: Every day | ORAL | 11 refills | Status: DC

## 2017-11-16 NOTE — Telephone Encounter (Signed)
please call patient: I have sent a prescription to your pharmacy, to add "glimepiride." I'll see you next time.

## 2017-11-16 NOTE — Telephone Encounter (Signed)
Carrie Atkins spoke with Carrie Atkins, patient's daughter & is sending back blood sugar readings.

## 2017-11-16 NOTE — Telephone Encounter (Signed)
Spoke with Melynda Keller (patients daughter) and she gave blood sugar readings for the last few days:  11/11/17- 268 before breakfast               344 before lunch               429 before dinner               Higher than 600 around 8 pm  11/12/17- 375 before breakfast               418 before lunch               471 before dinner               370 around 8 pm  11/13/17- 279 before breakfast               430 before lunch               417 before dinner               284 around 8 pm  11/14/17- 154 before breakfast                350 before lunch                323 before dinner                417 around 8 pm  11/15/17- 358 before breakfast               379 before lunch               438 before dinner               516 around 8 pm  Please advise if any changes need to be made

## 2017-11-16 NOTE — Telephone Encounter (Signed)
Patients daughter is calling back to give recent numbers for the week  Please advise

## 2017-11-16 NOTE — Addendum Note (Signed)
Addended by: Renato Shin on: 11/16/2017 04:48 PM   Modules accepted: Orders

## 2017-11-16 NOTE — Telephone Encounter (Signed)
Called but daughter was not at home, she is going to call back

## 2017-11-17 ENCOUNTER — Other Ambulatory Visit: Payer: Self-pay

## 2017-11-17 NOTE — Telephone Encounter (Signed)
I called patient's daughter & was able to tell her before we lost connection that the glimepiride prescription was sent to [facility].

## 2017-11-29 ENCOUNTER — Encounter: Payer: Medicare Other | Admitting: Dietician

## 2017-11-29 ENCOUNTER — Telehealth: Payer: Self-pay | Admitting: Endocrinology

## 2017-11-29 ENCOUNTER — Other Ambulatory Visit: Payer: Self-pay

## 2017-11-29 NOTE — Telephone Encounter (Signed)
Patient has difficulty coming in -almost bedridden-lives in [place] so long drive for daughter. Patient was rescheduled with Antonieta Iba for 12/24/17. Daughter Melynda Keller wants to make sure that above appt is with the correct provider since it is so difficult for patient to come in. Wants to make sure she is to see Mickel Baas and not Vaughan Basta. [facility] ph# [phone]

## 2017-11-30 ENCOUNTER — Telehealth: Payer: Self-pay | Admitting: Endocrinology

## 2017-11-30 NOTE — Telephone Encounter (Signed)
please call patient's dtr: This is progress.  Please verify the toujeo is 800 units qam.  Then please increase to 850 units qam.  I'll see you next time.

## 2017-11-30 NOTE — Telephone Encounter (Signed)
The appt is corrrect

## 2017-11-30 NOTE — Telephone Encounter (Signed)
LVM that appointment with Mickel Baas was correct.

## 2017-11-30 NOTE — Telephone Encounter (Signed)
-----   Message from Dorna Leitz, [place] sent at 11/29/2017  1:58 PM EDT ----- Regarding: Blood sugar readings 11/22/2017-11/29/2017                     B     L     D     N Monday    277   376  272  427 Tuesday   217  365  307  470 Wed.        [place] Thursday  196  375   319 Friday       134   264  303   382 Saturday   207   325  260   376 Sunday      227   278  337  367 Monday     14 9   291  Please advise? Patient's daughter also wanted to know if it was ok to start freestyle Libre?

## 2017-12-01 NOTE — Telephone Encounter (Signed)
LVM for patient to call back for new insulin dosage instructions.

## 2017-12-01 NOTE — Telephone Encounter (Signed)
I have spoken with patient's daughter, Melynda Keller. I gave her dosage increase & she stated that she would do so. She is also going through patient's PCP to get patient assistance with Toujeo from [employer]. Melynda Keller stated that she would try to find out how we could send new prescription or dosage to get amount increased. She will call back to let us know.

## 2017-12-08 ENCOUNTER — Encounter: Payer: Self-pay | Admitting: Endocrinology

## 2017-12-08 ENCOUNTER — Ambulatory Visit: Payer: Medicare Other | Admitting: Endocrinology

## 2017-12-08 VITALS — BP 128/82 | HR 97 | Ht 60.0 in | Wt 180.2 lb

## 2017-12-08 DIAGNOSIS — N183 Chronic kidney disease, stage 3 (moderate): Secondary | ICD-10-CM

## 2017-12-08 DIAGNOSIS — E1022 Type 1 diabetes mellitus with diabetic chronic kidney disease: Secondary | ICD-10-CM | POA: Diagnosis not present

## 2017-12-08 LAB — POCT GLYCOSYLATED HEMOGLOBIN (HGB A1C): HEMOGLOBIN A1C: 11.7 % — AB (ref 4.0–5.6)

## 2017-12-08 MED ORDER — INSULIN GLARGINE 300 UNIT/ML ~~LOC~~ SOPN: 880.0000 [IU] | PEN_INJECTOR | SUBCUTANEOUS | 11 refills | Status: DC

## 2017-12-08 NOTE — Patient Instructions (Addendum)
check your blood sugar 4 times a day: before the 3 meals, and at bedtime.  also check if you have symptoms of your blood sugar being too high or too low.  please keep a record of the readings and bring it to your next appointment here (or you can bring the meter itself).  You can write it on any piece of paper.  please call us sooner if your blood sugar goes below 70, or if you have a lot of readings over 200.  Please increase the toujeo to 880 units each morning, and: Please continue the same other diabetes medications.  Drinking plenty of fluids also helps the blood sugar.   Please come back for a follow-up appointment in 2 months.

## 2017-12-08 NOTE — Progress Notes (Signed)
Subjective:    Patient ID: Carrie Atkins, female    DOB: 06/04/1934, 84 y.o.   MRN: [id number]  HPI Pt returns for f/u of diabetes mellitus: DM type: 1 Dx'ed: 3614 Complications: polyneuropathy and renal insuff.   Therapy: insulin since 4315, trulicity, and amaryl.   GDM: never DKA: once (2018) Severe hypoglycemia: never. Pancreatitis: never Pancreatic imaging: never Other: dtr provides hx, due to pt's memory loss, but pt gives her own insulin; she takes qd insulin, after poor results with MDI; proper injection technique was verified with CDE; edema limits oral rx options.   Interval history: Pt takes meds as rx'ed.  she brings a record of her cbg's which I have reviewed today.  It varies from 103-400, despite increasing toujeo to 850 units qam.  No recent steroids.  pt states she feels well in general, except for fatigue.   Past Medical History:  Diagnosis Date  . Chest pain   . Chronic lymphocytic leukemia ([facility])   . CLL (chronic lymphocytic leukemia) ([facility]) 06/05/2013  . Diabetes mellitus   . Hyperlipidemia   . Hypertension   . Leukemia ([facility])   . SOB (shortness of breath)     Past Surgical History:  Procedure Laterality Date  . CARDIOVASCULAR STRESS TEST  05/29/2010   EF 83%  . CHOLECYSTECTOMY    . TONSILLECTOMY    . TOTAL KNEE ARTHROPLASTY    . US ECHOCARDIOGRAPHY  01/10/2007   EF 55-60%  . US ECHOCARDIOGRAPHY  02/23/2006    EF 55-60%    Social History   Socioeconomic History  . Marital status: Widowed    Spouse name: Not on file  . Number of children: 1  . Years of education: 55  . Highest education level: Not on file  Occupational History  . Not on file  Social Needs  . Financial resource strain: Not on file  . Food insecurity:    Worry: Not on file    Inability: Not on file  . Transportation needs:    Medical: Not on file    Non-medical: Not on file  Tobacco Use  . Smoking status: Never Smoker  . Smokeless tobacco: Never Used  Substance and Sexual  Activity  . Alcohol use: No  . Drug use: No  . Sexual activity: Never  Lifestyle  . Physical activity:    Days per week: Not on file    Minutes per session: Not on file  . Stress: Not on file  Relationships  . Social connections:    Talks on phone: Not on file    Gets together: Not on file    Attends religious service: Not on file    Active member of club or organization: Not on file    Attends meetings of clubs or organizations: Not on file    Relationship status: Not on file  . Intimate partner violence:    Fear of current or ex partner: Not on file    Emotionally abused: Not on file    Physically abused: Not on file    Forced sexual activity: Not on file  Other Topics Concern  . Not on file  Social History Narrative   Lives in "mother in law suite" at her sons house   Drinks decaf    Current Outpatient Medications on File Prior to Visit  Medication Sig Dispense Refill  . acetaminophen (TYLENOL ARTHRITIS PAIN) 650 MG CR tablet Take 1,300 mg by mouth 2 (two) times daily.      Marland Kitchen  aspirin EC 81 MG tablet Take 81 mg by mouth daily.    Marland Kitchen buPROPion (WELLBUTRIN SR) 100 MG 12 hr tablet Take 100 mg by mouth daily.     Marland Kitchen CALCIUM-MAGNESIUM-ZINC PO Take 2 tablets by mouth 2 (two) times daily.     . carvedilol (COREG) 3.125 MG tablet TAKE 1 TABLET BY MOUTH TWICE DAILY WITH A MEAL 60 tablet 9  . Cranberry 450 MG CAPS Take 1 tablet by mouth 2 (two) times daily.    . Dulaglutide (TRULICITY) 1.5 BP/1.0CH SOPN Inject 1.5 mg into the skin once a week. 4 pen 11  . ferrous sulfate 325 (65 FE) MG tablet Take 325 mg by mouth daily with breakfast.      . glimepiride (AMARYL) 2 MG tablet Take 1 tablet (2 mg total) by mouth daily with breakfast. 30 tablet 11  . levothyroxine (SYNTHROID, LEVOTHROID) 25 MCG tablet Take 25 mcg by mouth daily.    Marland Kitchen nystatin cream (MYCOSTATIN) Apply 1 application topically daily as needed for dry skin.    . ranitidine (ZANTAC) 150 MG tablet Take 150 mg by mouth 2 (two)  times daily as needed for heartburn.    . sertraline (ZOLOFT) 100 MG tablet Take 100 mg by mouth daily after breakfast.    . trimethoprim (TRIMPEX) 100 MG tablet Take 100 mg by mouth 2 (two) times daily.     No current facility-administered medications on file prior to visit.     Allergies  Allergen Reactions  . Sulfonamide Derivatives Nausea And Vomiting  . Hydrocodone Nausea And Vomiting  . Sulfa Antibiotics Nausea And Vomiting    Family History  Problem Relation Age of Onset  . Stroke Mother   . Lung cancer Father   . COPD Brother   . Diabetes Maternal Grandmother     BP 128/82 (BP Location: Left Arm, Patient Position: Sitting, Cuff Size: Normal)   Pulse 97   Ht 5' (1.524 m)   Wt 180 lb 3.2 oz (81.7 kg)   SpO2 99%   BMI 35.19 kg/m    Review of Systems She denies hypoglycemia.      Objective:   Physical Exam VITAL SIGNS:  See vs page GENERAL: no distress Pulses: foot pulses are intact bilaterally.   MSK: no deformity of the feet or ankles.  CV: trace bilat edema of the legs.  Skin:  no ulcer on the feet or ankles.  normal color and temp on the feet and ankles Neuro: sensation is intact to touch on the feet and ankles.   Ext: There is bilateral onychomycosis of the toenails.    Lab Results  Component Value Date   HGBA1C 11.7 (A) 12/08/2017   Lab Results  Component Value Date   CREATININE 1.24 (H) 02/04/2017   BUN 22 (H) 02/04/2017   NA 136 02/04/2017   K 4.3 02/04/2017   CL 105 02/04/2017   CO2 23 02/04/2017       Assessment & Plan:  Insulin-requiring type 2 DM: she needs increased rx Renal insuff: this limits rx options.  Fatigue: I told pt this might improve with improved glycemic control.  Patient Instructions  check your blood sugar 4 times a day: before the 3 meals, and at bedtime.  also check if you have symptoms of your blood sugar being too high or too low.  please keep a record of the readings and bring it to your next appointment here (or  you can bring the meter itself).  You can write  it on any piece of paper.  please call us sooner if your blood sugar goes below 70, or if you have a lot of readings over 200.  Please increase the toujeo to 880 units each morning, and: Please continue the same other diabetes medications.  Drinking plenty of fluids also helps the blood sugar.   Please come back for a follow-up appointment in 2 months.

## 2017-12-24 ENCOUNTER — Encounter: Payer: Medicare Other | Attending: Endocrinology | Admitting: Dietician

## 2017-12-24 ENCOUNTER — Encounter: Payer: Self-pay | Admitting: Dietician

## 2017-12-24 DIAGNOSIS — Z713 Dietary counseling and surveillance: Secondary | ICD-10-CM | POA: Insufficient documentation

## 2017-12-24 DIAGNOSIS — N183 Chronic kidney disease, stage 3 (moderate): Secondary | ICD-10-CM | POA: Diagnosis present

## 2017-12-24 DIAGNOSIS — E1022 Type 1 diabetes mellitus with diabetic chronic kidney disease: Secondary | ICD-10-CM | POA: Diagnosis present

## 2017-12-24 NOTE — Patient Instructions (Addendum)
No Dark Soda.  Consider diet gingerale or 7-up or sprite instead. Boil or stew the meat rather than fry. Vegetable plates are fine.  Choose beans, cottage cheese or tuna for protein with those meals.   1 Glucerna per day is fine for a snack Avoid added salt and find ways to reduce sodium in your cooking. Rather than the ice cream consider sugar free jello or popsicles.   Continue to drink beverages without carbohydrates. Consider getting your vitamin D checked.  Plan:  Aim for 2-3 Carb Choices per meal (30-45 grams) +/- 1 either way  Aim for 0-15 Carbs per snack if hungry  Include protein in moderation with your meals and snacks Consider reading food labels for Total Carbohydrate and Fat Grams of foods Consider  increasing your activity level by armchair exercises and walking for 15 minutes daily as tolerated Continue taking medication as directed by MD

## 2017-12-24 NOTE — Progress Notes (Signed)
Diabetes Self-Management Education  Visit Type: First/Initial  Appt. Start Time: 1110 Appt. End Time: 1230  12/26/2017  [name], identified by name and date of birth, is a 84 y.o. female with a diagnosis of Diabetes: Type 1. Patient of Dr. Loanne Drilling.  Other history includes CKD, hyperlipicemia, HTN, chronic lymphocytic leukemia.   Labs noted eGFR 40 (02/04/17) Weight 182 lbs today increased from 175 lbs 09/27/17 Medications include:  Toujeo 880 units each am, Trulicity, glimepiride  Patient lives with her daughter and son-in-law and grandson.  Her daughter does the shopping and cooking.   She has made changes by decreasing the amount of ice cream that she has been eating in the past month.   She would benefit from exercise and suggested armchair exercises.  She has no teeth and does not find that the dentures are comfortable and this effects what food she eats. She has the Marty but has not started this yet.  Showed her how to put this on using a demonstration video as she did not have this with her today.  ASSESSMENT  Height 5' (1.524 m), weight 182 lb (82.6 kg). Body mass index is 35.54 kg/m.  Diabetes Self-Management Education - 12/24/17 1128      Visit Information   Visit Type  First/Initial      Initial Visit   Diabetes Type  Type 1    Are you currently following a meal plan?  No    Are you taking your medications as prescribed?  Yes    Date Diagnosed  2000      Health Coping   How would you rate your overall health?  Fair      Psychosocial Assessment   Patient Belief/Attitude about Diabetes  Motivated to manage diabetes    Self-care barriers  Debilitated state due to current medical condition    Self-management support  Doctor's office;Family    Other persons present  Patient;Family Member daughter    Patient Concerns  Nutrition/Meal planning;Glycemic Control;Monitoring    Special Needs  Instruct caregiver    Preferred Learning Style  No preference indicated     Learning Readiness  Ready    How often do you need to have someone help you when you read instructions, pamphlets, or other written materials from your doctor or pharmacy?  3 - Sometimes    What is the last grade level you completed in school?  12th grade      Pre-Education Assessment   Patient understands the diabetes disease and treatment process.  Needs Review    Patient understands incorporating nutritional management into lifestyle.  Needs Review    Patient undertands incorporating physical activity into lifestyle.  Needs Review    Patient understands using medications safely.  Needs Review    Patient understands monitoring blood glucose, interpreting and using results  Needs Review    Patient understands prevention, detection, and treatment of acute complications.  Needs Review    Patient understands prevention, detection, and treatment of chronic complications.  Needs Review    Patient understands how to develop strategies to address psychosocial issues.  Needs Review    Patient understands how to develop strategies to promote health/change behavior.  Needs Review      Complications   Last HgB A1C per patient/outside source  11.7 % 12/02/17 decreased from 14.3% 09/2017    How often do you check your blood sugar?  3-4 times/day    Fasting Blood glucose range (mg/dL)  70-129;130-179;180-200;>200    Postprandial  Blood glucose range (mg/dL)  >200    Number of hypoglycemic episodes per month  0    Number of hyperglycemic episodes per week  14    Can you tell when your blood sugar is high?  No    Have you had a dilated eye exam in the past 12 months?  Yes    Have you had a dental exam in the past 12 months?  Yes    Are you checking your feet?  Yes    How many days per week are you checking your feet?  1      Dietary Intake   Breakfast  2 Waffles, SF syrup OR cornflakes, 2% milk OR instant oatmeal oatmeal WITH black coffee, fruit and regular activia yogurt    Snack (morning)  carb  smart ice cream bar    Lunch  homemade soup OR sandwich OR frozen dinner OR frozen chicken pie    Snack (afternoon)  occasional yogurt or orange, honey graham crackers    Dinner  fried chicken, 2 vegetables, occasional mac and cheese OR hamburger on a bun OR chili    Snack (evening)  carb smart ice cream bar    Beverage(s)  black coffee, water, diet and caffeine free Pepsi      Exercise   Exercise Type  ADL's      Patient Education   Previous Diabetes Education  Yes (please comment) 2010    Disease state   Other (comment) review    Nutrition management   Role of diet in the treatment of diabetes and the relationship between the three main macronutrients and blood glucose level;Food label reading, portion sizes and measuring food.;Meal options for control of blood glucose level and chronic complications.;Meal timing in regards to the patients' current diabetes medication.    Physical activity and exercise   Role of exercise on diabetes management, blood pressure control and cardiac health.;Helped patient identify appropriate exercises in relation to his/her diabetes, diabetes complications and other health issue.    Medications  Reviewed patients medication for diabetes, action, purpose, timing of dose and side effects.    Monitoring  Purpose and frequency of SMBG.;Identified appropriate SMBG and/or A1C goals.;Daily foot exams    Acute complications  Taught treatment of hypoglycemia - the 15 rule.    Chronic complications  Relationship between chronic complications and blood glucose control    Psychosocial adjustment  Worked with patient to identify barriers to care and solutions    Personal strategies to promote health  Lifestyle issues that need to be addressed for better diabetes care      Individualized Goals (developed by patient)   Nutrition  General guidelines for healthy choices and portions discussed    Physical Activity  Exercise 3-5 times per week;15 minutes per day    Medications   take my medication as prescribed    Monitoring   test my blood glucose as discussed    Reducing Risk  examine blood glucose patterns    Health Coping  discuss diabetes with (comment) MD, RD, CDE      Post-Education Assessment   Patient understands the diabetes disease and treatment process.  Demonstrates understanding / competency    Patient understands incorporating nutritional management into lifestyle.  Demonstrates understanding / competency    Patient undertands incorporating physical activity into lifestyle.  Demonstrates understanding / competency    Patient understands using medications safely.  Demonstrates understanding / competency    Patient understands monitoring blood glucose, interpreting and  using results  Demonstrates understanding / competency    Patient understands prevention, detection, and treatment of acute complications.  Demonstrates understanding / competency    Patient understands prevention, detection, and treatment of chronic complications.  Demonstrates understanding / competency    Patient understands how to develop strategies to address psychosocial issues.  Demonstrates understanding / competency    Patient understands how to develop strategies to promote health/change behavior.  Demonstrates understanding / competency      Outcomes   Expected Outcomes  Demonstrated interest in learning. Expect positive outcomes    Future DMSE  PRN    Program Status  Completed       Individualized Plan for Diabetes Self-Management Training:   Learning Objective:  Patient will have a greater understanding of diabetes self-management. Patient education plan is to attend individual and/or group sessions per assessed needs and concerns.   Plan:   Patient Instructions  No Dark Soda.  Consider diet gingerale or 7-up or sprite instead. Boil or stew the meat rather than fry. Vegetable plates are fine.  Choose beans, cottage cheese or tuna for protein with those meals.   1  Glucerna per day is fine for a snack Avoid added salt and find ways to reduce sodium in your cooking. Rather than the ice cream consider sugar free jello or popsicles.   Continue to drink beverages without carbohydrates. Consider getting your vitamin D checked.  Plan:  Aim for 2-3 Carb Choices per meal (30-45 grams) +/- 1 either way  Aim for 0-15 Carbs per snack if hungry  Include protein in moderation with your meals and snacks Consider reading food labels for Total Carbohydrate and Fat Grams of foods Consider  increasing your activity level by armchair exercises and walking for 15 minutes daily as tolerated Continue taking medication as directed by MD      Expected Outcomes:  Demonstrated interest in learning. Expect positive outcomes  Education material provided: ADA Diabetes: Your Take Control Guide, Food label handouts, A1C conversion sheet, Meal plan card, My Plate and Snack sheet  If problems or questions, patient to contact team via:  Phone  Future DSME appointment: PRN

## 2018-02-09 ENCOUNTER — Encounter: Payer: Self-pay | Admitting: Endocrinology

## 2018-02-09 ENCOUNTER — Ambulatory Visit: Payer: Medicare Other | Admitting: Endocrinology

## 2018-02-09 VITALS — BP 138/84 | HR 84 | Ht 60.0 in | Wt 186.6 lb

## 2018-02-09 DIAGNOSIS — N183 Chronic kidney disease, stage 3 unspecified: Secondary | ICD-10-CM

## 2018-02-09 DIAGNOSIS — E1022 Type 1 diabetes mellitus with diabetic chronic kidney disease: Secondary | ICD-10-CM

## 2018-02-09 LAB — POCT GLYCOSYLATED HEMOGLOBIN (HGB A1C): HEMOGLOBIN A1C: 8.1 % — AB (ref 4.0–5.6)

## 2018-02-09 NOTE — Progress Notes (Signed)
Subjective:    Patient ID: Carrie Atkins, female    DOB: Dec 18, 1933, 84 y.o.   MRN: [id number]  HPI Pt returns for f/u of diabetes mellitus: DM type: 1 Dx'ed: 6789 Complications: polyneuropathy and renal insuff.   Therapy: insulin since 3810, trulicity, and amaryl.   GDM: never.   DKA: once (2018) Severe hypoglycemia: never. Pancreatitis: never Pancreatic imaging: never.   Other: dtr provides hx, due to pt's memory loss, but pt gives her own insulin; she takes qd insulin, after poor results with MDI; proper injection technique was verified with CDE; edema limits oral rx options.   Interval history: Pt takes meds as rx'ed.  she brings a record of her cbg's which I have reviewed today, checked qid.   It varies from 84-300.   No recent steroids.  pt states she feels well in general, except for fatigue.  Past Medical History:  Diagnosis Date  . Chest pain   . Chronic lymphocytic leukemia ([place])   . CLL (chronic lymphocytic leukemia) ([place]) 06/05/2013  . Diabetes mellitus   . Hyperlipidemia   . Hypertension   . Leukemia ([place])   . SOB (shortness of breath)     Past Surgical History:  Procedure Laterality Date  . CARDIOVASCULAR STRESS TEST  05/29/2010   EF 83%  . CHOLECYSTECTOMY    . TONSILLECTOMY    . TOTAL KNEE ARTHROPLASTY    . US ECHOCARDIOGRAPHY  01/10/2007   EF 55-60%  . US ECHOCARDIOGRAPHY  02/23/2006    EF 55-60%    Social History   Socioeconomic History  . Marital status: Widowed    Spouse name: Not on file  . Number of children: 1  . Years of education: 18  . Highest education level: Not on file  Occupational History  . Not on file  Social Needs  . Financial resource strain: Not on file  . Food insecurity:    Worry: Not on file    Inability: Not on file  . Transportation needs:    Medical: Not on file    Non-medical: Not on file  Tobacco Use  . Smoking status: Never Smoker  . Smokeless tobacco: Never Used  Substance and Sexual Activity  . Alcohol use:  No  . Drug use: No  . Sexual activity: Never  Lifestyle  . Physical activity:    Days per week: Not on file    Minutes per session: Not on file  . Stress: Not on file  Relationships  . Social connections:    Talks on phone: Not on file    Gets together: Not on file    Attends religious service: Not on file    Active member of club or organization: Not on file    Attends meetings of clubs or organizations: Not on file    Relationship status: Not on file  . Intimate partner violence:    Fear of current or ex partner: Not on file    Emotionally abused: Not on file    Physically abused: Not on file    Forced sexual activity: Not on file  Other Topics Concern  . Not on file  Social History Narrative   Lives in "mother in law suite" at her sons house   Drinks decaf    Current Outpatient Medications on File Prior to Visit  Medication Sig Dispense Refill  . acetaminophen (TYLENOL ARTHRITIS PAIN) 650 MG CR tablet Take 1,300 mg by mouth 2 (two) times daily.      Marland Kitchen  aspirin EC 81 MG tablet Take 81 mg by mouth daily.    Marland Kitchen buPROPion (WELLBUTRIN SR) 100 MG 12 hr tablet Take 100 mg by mouth daily.     Marland Kitchen CALCIUM-MAGNESIUM-ZINC PO Take 2 tablets by mouth 2 (two) times daily.     . carvedilol (COREG) 3.125 MG tablet TAKE 1 TABLET BY MOUTH TWICE DAILY WITH A MEAL 60 tablet 9  . Cranberry 450 MG CAPS Take 1 tablet by mouth 2 (two) times daily.    . Dulaglutide (TRULICITY) 1.5 EG/3.1DV SOPN Inject 1.5 mg into the skin once a week. 4 pen 11  . ferrous sulfate 325 (65 FE) MG tablet Take 325 mg by mouth daily with breakfast.      . glimepiride (AMARYL) 2 MG tablet Take 1 tablet (2 mg total) by mouth daily with breakfast. 30 tablet 11  . Insulin Glargine (TOUJEO SOLOSTAR) 300 UNIT/ML SOPN Inject 880 Units into the skin every morning. And pen needles 1/day 40 pen 11  . levothyroxine (SYNTHROID, LEVOTHROID) 25 MCG tablet Take 25 mcg by mouth daily.    . ranitidine (ZANTAC) 150 MG tablet Take 150 mg by  mouth 2 (two) times daily as needed for heartburn.    . trimethoprim (TRIMPEX) 100 MG tablet Take 100 mg by mouth 2 (two) times daily.    . methenamine (HIPREX) 1 g tablet     . nystatin cream (MYCOSTATIN) Apply 1 application topically daily as needed for dry skin.    Marland Kitchen sertraline (ZOLOFT) 100 MG tablet Take 100 mg by mouth daily after breakfast.     No current facility-administered medications on file prior to visit.     Allergies  Allergen Reactions  . Sulfonamide Derivatives Nausea And Vomiting  . Hydrocodone Nausea And Vomiting  . Sulfa Antibiotics Nausea And Vomiting    Family History  Problem Relation Age of Onset  . Stroke Mother   . Lung cancer Father   . COPD Brother   . Diabetes Maternal Grandmother     BP 138/84 (BP Location: Right Arm, Patient Position: Sitting, Cuff Size: Normal)   Pulse 84   Ht 5' (1.524 m)   Wt 186 lb 9.6 oz (84.6 kg)   SpO2 96%   BMI 36.44 kg/m    Review of Systems She denies hypoglycemia.      Objective:   Physical Exam VITAL SIGNS:  See vs page GENERAL: no distress Pulses: foot pulses are intact bilaterally.   MSK: no deformity of the feet or ankles.  CV: trace bilat edema of the legs.  Skin:  no ulcer on the feet or ankles.  normal color and temp on the feet and ankles Neuro: sensation is intact to touch on the feet and ankles.   Ext: There is severe bilateral onychomycosis of the toenails.    Lab Results  Component Value Date   HGBA1C 8.1 (A) 02/09/2018       Assessment & Plan:  Type 1 DM, with renal insuff: glycemic control is improved Memory loss: this is a relative contraindication to multiple daily injections Frail elderly state: she is not a candidate for aggressive glycemic control   Patient Instructions  check your blood sugar 4 times a day: before the 3 meals, and at bedtime.  also check if you have symptoms of your blood sugar being too high or too low.  please keep a record of the readings and bring it to your  next appointment here (or you can bring the meter itself).  You can write it on any piece of paper.  please call us sooner if your blood sugar goes below 70, or if you have a lot of readings over 200.  Please continue the same diabetes medications.  Drinking plenty of fluids also helps the blood sugar.   Please come back for a follow-up appointment in 2 months.

## 2018-02-09 NOTE — Patient Instructions (Addendum)
check your blood sugar 4 times a day: before the 3 meals, and at bedtime.  also check if you have symptoms of your blood sugar being too high or too low.  please keep a record of the readings and bring it to your next appointment here (or you can bring the meter itself).  You can write it on any piece of paper.  please call us sooner if your blood sugar goes below 70, or if you have a lot of readings over 200.  Please continue the same diabetes medications.  Drinking plenty of fluids also helps the blood sugar.   Please come back for a follow-up appointment in 2 months.

## 2018-04-12 ENCOUNTER — Encounter: Payer: Self-pay | Admitting: Endocrinology

## 2018-04-12 ENCOUNTER — Ambulatory Visit: Payer: Medicare Other | Admitting: Endocrinology

## 2018-04-12 VITALS — BP 138/80 | HR 76 | Ht 60.0 in | Wt 186.4 lb

## 2018-04-12 DIAGNOSIS — Z23 Encounter for immunization: Secondary | ICD-10-CM | POA: Diagnosis not present

## 2018-04-12 DIAGNOSIS — N183 Chronic kidney disease, stage 3 (moderate): Secondary | ICD-10-CM | POA: Diagnosis not present

## 2018-04-12 DIAGNOSIS — E1022 Type 1 diabetes mellitus with diabetic chronic kidney disease: Secondary | ICD-10-CM | POA: Diagnosis not present

## 2018-04-12 LAB — POCT GLYCOSYLATED HEMOGLOBIN (HGB A1C): HEMOGLOBIN A1C: 8.4 % — AB (ref 4.0–5.6)

## 2018-04-12 MED ORDER — INSULIN GLARGINE 300 UNIT/ML ~~LOC~~ SOPN: 900.0000 [IU] | PEN_INJECTOR | SUBCUTANEOUS | 11 refills | Status: DC

## 2018-04-12 NOTE — Progress Notes (Signed)
Subjective:    Patient ID: Carrie Atkins, female    DOB: 1933-10-16, 84 y.o.   MRN: [id number]  HPI Pt returns for f/u of diabetes mellitus: DM type: 1 Dx'ed: 9833 Complications: polyneuropathy and renal insuff.   Therapy: insulin since 8250, trulicity, and amaryl.   GDM: never.   DKA: once (2018) Severe hypoglycemia: never. Pancreatitis: never Pancreatic imaging: never.   Other: dtr provides hx, due to pt's memory loss, but pt gives her own insulin; she takes qd insulin, after poor results with MDI; proper injection technique was verified with CDE; edema limits oral rx options.   Interval history: Pt takes meds as rx'ed.  I reviewed continuous glucose monitor data.  Glucose varies from 130-340.  It is in general highest at lunch, but there is no info after 7 PM.  pt states she feels well in general.   Past Medical History:  Diagnosis Date  . Chest pain   . Chronic lymphocytic leukemia ([place])   . CLL (chronic lymphocytic leukemia) ([place]) 06/05/2013  . Diabetes mellitus   . Hyperlipidemia   . Hypertension   . Leukemia ([place])   . SOB (shortness of breath)     Past Surgical History:  Procedure Laterality Date  . CARDIOVASCULAR STRESS TEST  05/29/2010   EF 83%  . CHOLECYSTECTOMY    . TONSILLECTOMY    . TOTAL KNEE ARTHROPLASTY    . US ECHOCARDIOGRAPHY  01/10/2007   EF 55-60%  . US ECHOCARDIOGRAPHY  02/23/2006    EF 55-60%    Social History   Socioeconomic History  . Marital status: Widowed    Spouse name: Not on file  . Number of children: 1  . Years of education: 33  . Highest education level: Not on file  Occupational History  . Not on file  Social Needs  . Financial resource strain: Not on file  . Food insecurity:    Worry: Not on file    Inability: Not on file  . Transportation needs:    Medical: Not on file    Non-medical: Not on file  Tobacco Use  . Smoking status: Never Smoker  . Smokeless tobacco: Never Used  Substance and Sexual Activity  . Alcohol  use: No  . Drug use: No  . Sexual activity: Never  Lifestyle  . Physical activity:    Days per week: Not on file    Minutes per session: Not on file  . Stress: Not on file  Relationships  . Social connections:    Talks on phone: Not on file    Gets together: Not on file    Attends religious service: Not on file    Active member of club or organization: Not on file    Attends meetings of clubs or organizations: Not on file    Relationship status: Not on file  . Intimate partner violence:    Fear of current or ex partner: Not on file    Emotionally abused: Not on file    Physically abused: Not on file    Forced sexual activity: Not on file  Other Topics Concern  . Not on file  Social History Narrative   Lives in "mother in law suite" at her sons house   Drinks decaf    Current Outpatient Medications on File Prior to Visit  Medication Sig Dispense Refill  . acetaminophen (TYLENOL ARTHRITIS PAIN) 650 MG CR tablet Take 1,300 mg by mouth 2 (two) times daily.      Marland Kitchen  aspirin EC 81 MG tablet Take 81 mg by mouth daily.    Marland Kitchen buPROPion (WELLBUTRIN SR) 100 MG 12 hr tablet Take 100 mg by mouth daily.     Marland Kitchen CALCIUM-MAGNESIUM-ZINC PO Take 2 tablets by mouth 2 (two) times daily.     . carvedilol (COREG) 3.125 MG tablet TAKE 1 TABLET BY MOUTH TWICE DAILY WITH A MEAL 60 tablet 9  . Cranberry 450 MG CAPS Take 1 tablet by mouth 2 (two) times daily.    . Dulaglutide (TRULICITY) 1.5 KX/3.8HW SOPN Inject 1.5 mg into the skin once a week. 4 pen 11  . ferrous sulfate 325 (65 FE) MG tablet Take 325 mg by mouth daily with breakfast.      . glimepiride (AMARYL) 2 MG tablet Take 1 tablet (2 mg total) by mouth daily with breakfast. 30 tablet 11  . levothyroxine (SYNTHROID, LEVOTHROID) 25 MCG tablet Take 25 mcg by mouth daily.    . methenamine (HIPREX) 1 g tablet 2 (two) times daily with a meal.     . nystatin cream (MYCOSTATIN) Apply 1 application topically daily as needed for dry skin.    . ranitidine  (ZANTAC) 150 MG tablet Take 150 mg by mouth 2 (two) times daily as needed for heartburn.    . sertraline (ZOLOFT) 100 MG tablet Take 100 mg by mouth daily after breakfast.     No current facility-administered medications on file prior to visit.     Allergies  Allergen Reactions  . Sulfonamide Derivatives Nausea And Vomiting  . Hydrocodone Nausea And Vomiting  . Sulfa Antibiotics Nausea And Vomiting    Family History  Problem Relation Age of Onset  . Stroke Mother   . Lung cancer Father   . COPD Brother   . Diabetes Maternal Grandmother     BP 138/80 (BP Location: Right Arm, Patient Position: Sitting, Cuff Size: Normal)   Pulse 76   Ht 5' (1.524 m)   Wt 186 lb 6.4 oz (84.6 kg)   SpO2 97%   BMI 36.40 kg/m    Review of Systems She denies hypoglycemia    Objective:   Physical Exam VITAL SIGNS:  See vs page GENERAL: no distress Pulses: foot pulses are intact bilaterally.   MSK: no deformity of the feet or ankles.  CV: trace bilat edema of the legs.  Skin:  no ulcer on the feet or ankles.  normal color and temp on the feet and ankles Neuro: sensation is intact to touch on the feet and ankles.   Ext: bilateral onychomycosis of the toenails is again noted   Lab Results  Component Value Date   HGBA1C 8.4 (A) 04/12/2018   Lab Results  Component Value Date   CREATININE 1.24 (H) 02/04/2017   BUN 22 (H) 02/04/2017   NA 136 02/04/2017   K 4.3 02/04/2017   CL 105 02/04/2017   CO2 23 02/04/2017       Assessment & Plan:  Type 1 DM, with polyneuropathy: worse Severe insulin resistance: we discussed safety of this dosage of insulin Renal failure: this limits rx options.  Patient Instructions  check your blood sugar 4 times a day: before the 3 meals, and at bedtime.  also check if you have symptoms of your blood sugar being too high or too low.  please keep a record of the readings and bring it to your next appointment here (or you can bring the meter itself).  You can  write it on any piece of paper.  please call us sooner if your blood sugar goes below 70, or if you have a lot of readings over 200.  Please increase the Toujeo to 900 units each morning.   Please come back for a follow-up appointment in 3 months.

## 2018-04-12 NOTE — Patient Instructions (Addendum)
check your blood sugar 4 times a day: before the 3 meals, and at bedtime.  also check if you have symptoms of your blood sugar being too high or too low.  please keep a record of the readings and bring it to your next appointment here (or you can bring the meter itself).  You can write it on any piece of paper.  please call us sooner if your blood sugar goes below 70, or if you have a lot of readings over 200.  Please increase the Toujeo to 900 units each morning.   Please come back for a follow-up appointment in 3 months.

## 2018-04-14 IMAGING — CR DG CHEST 2V
2 series · 2 of 2 positions shown · non-contrast
Comparison: 05/22/2010.

CLINICAL DATA: Hypoxia.  Cough.  Shortness of breath

EXAM:
CHEST  2 VIEW

[w chest pa]
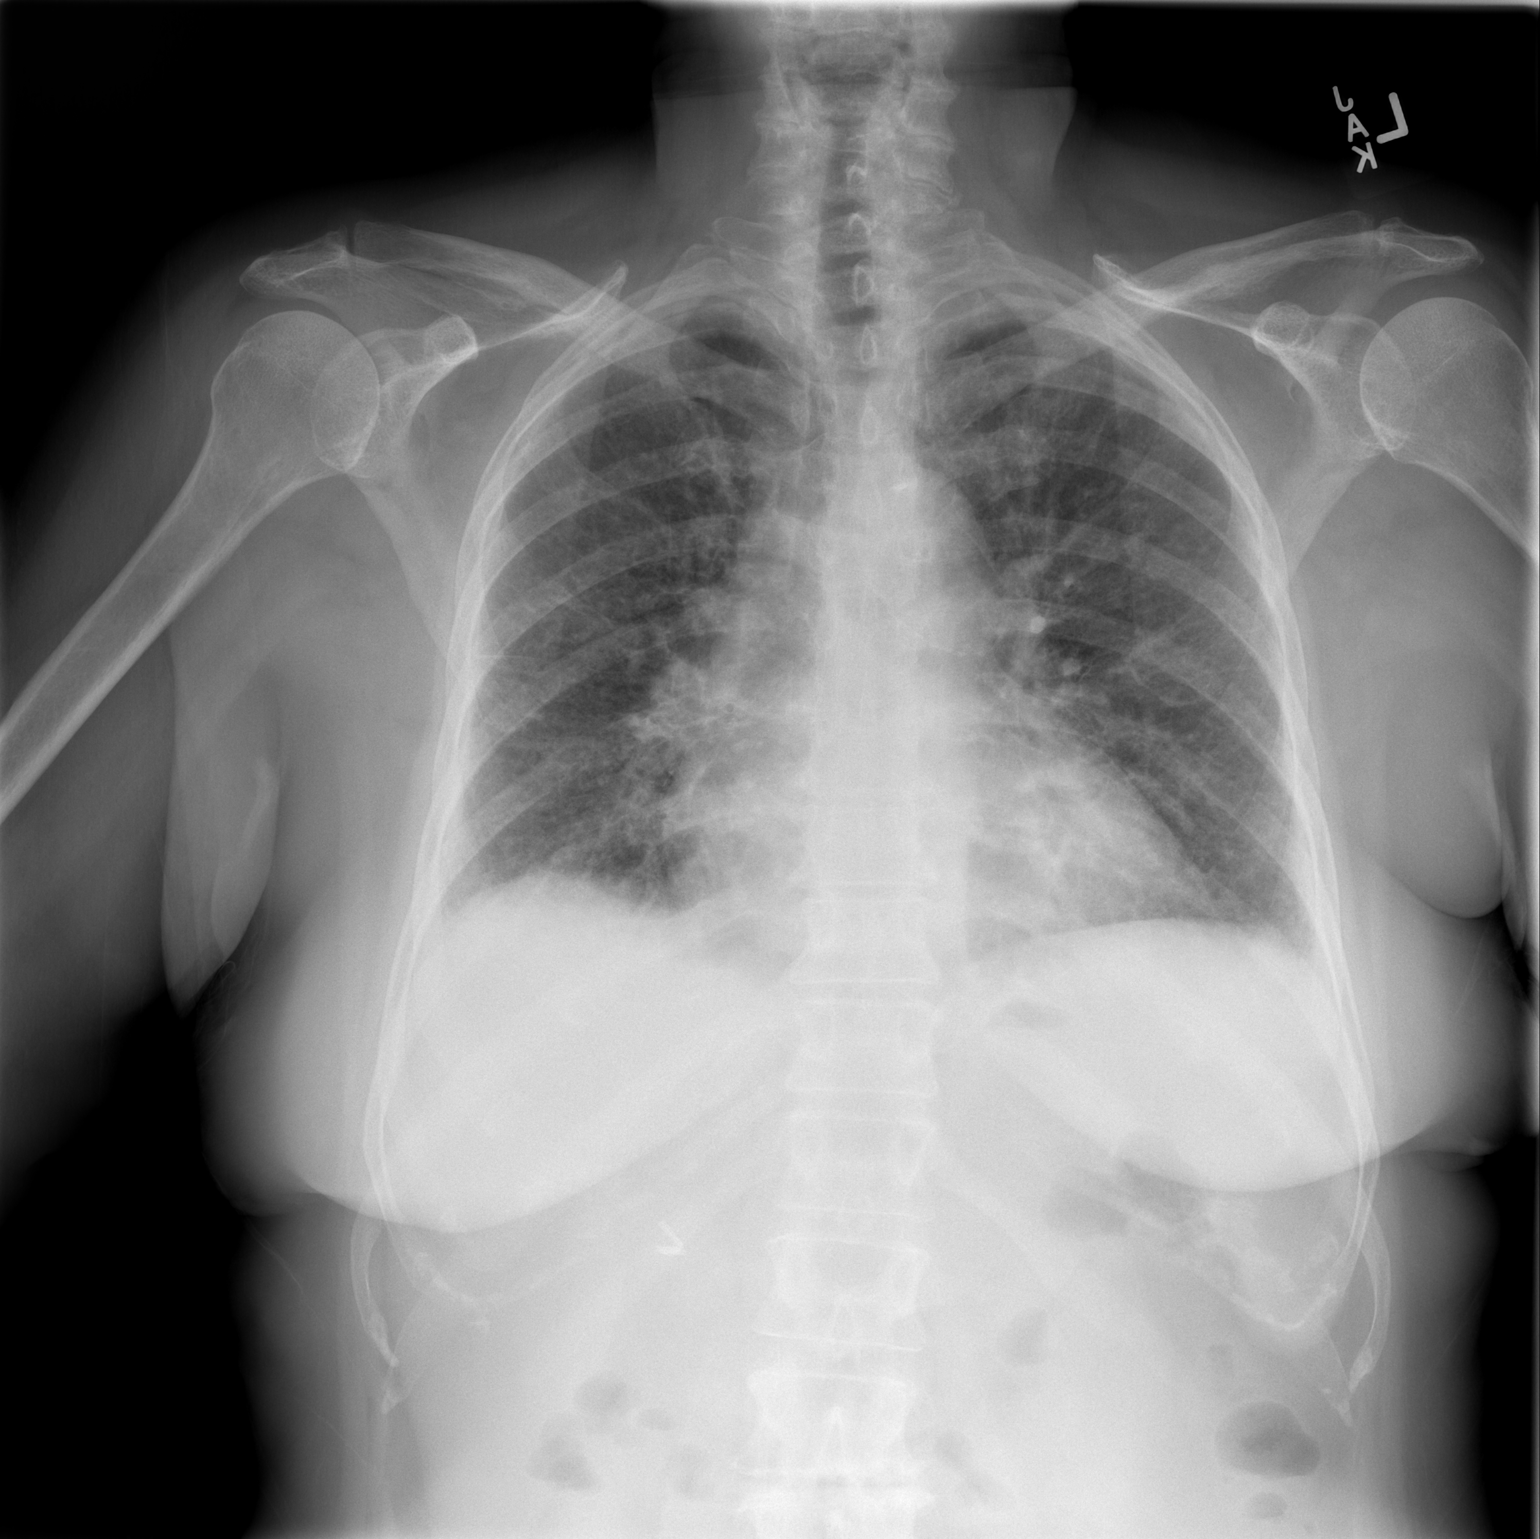

[w chest lat]
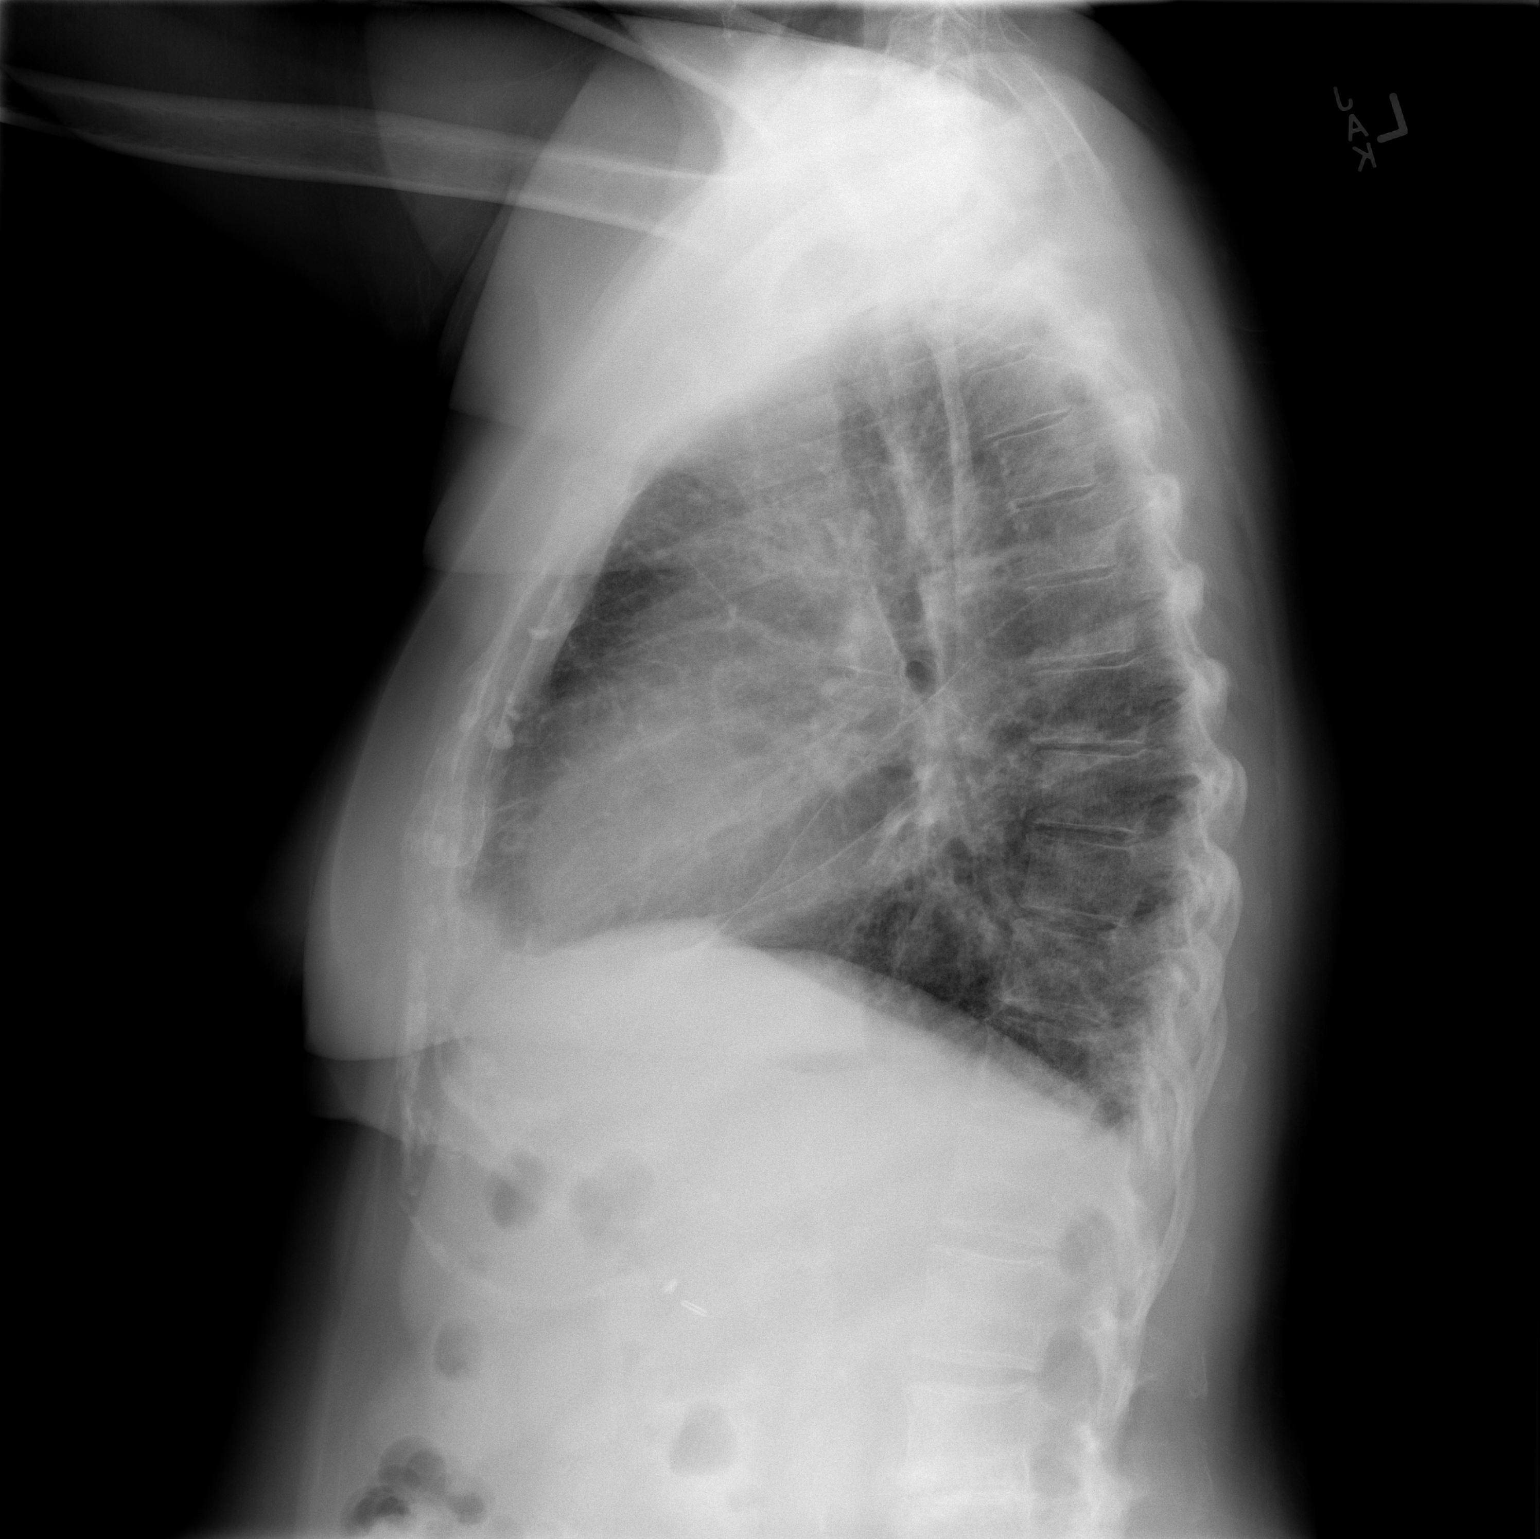

[2 of 2 positions shown; findings below may reference images not displayed]

FINDINGS: Cardiomegaly with mild bilateral pulmonary interstitial prominence.
Mild congestive heart failure cannot be excluded. Pneumonitis cannot
be excluded. Tiny bilateral pleural effusions. No pneumothorax .
IMPRESSION: Cardiomegaly with mild bilateral pulmonary interstitial prominence
suggesting mild congestive heart failure. Pneumonitis cannot be
excluded. Tiny bilateral pleural effusions

## 2018-07-13 ENCOUNTER — Encounter: Payer: Self-pay | Admitting: Endocrinology

## 2018-07-13 ENCOUNTER — Ambulatory Visit: Payer: Medicare Other | Admitting: Endocrinology

## 2018-07-13 ENCOUNTER — Telehealth: Payer: Self-pay

## 2018-07-13 ENCOUNTER — Telehealth: Payer: Self-pay | Admitting: Endocrinology

## 2018-07-13 VITALS — BP 130/68 | HR 69 | Ht 60.0 in | Wt 195.0 lb

## 2018-07-13 DIAGNOSIS — N183 Chronic kidney disease, stage 3 (moderate): Secondary | ICD-10-CM | POA: Diagnosis not present

## 2018-07-13 DIAGNOSIS — E1022 Type 1 diabetes mellitus with diabetic chronic kidney disease: Secondary | ICD-10-CM | POA: Diagnosis not present

## 2018-07-13 DIAGNOSIS — D649 Anemia, unspecified: Secondary | ICD-10-CM | POA: Diagnosis not present

## 2018-07-13 LAB — BASIC METABOLIC PANEL
BUN: 19 mg/dL (ref 6–23)
CO2: 26 mEq/L (ref 19–32)
Calcium: 10.2 mg/dL (ref 8.4–10.5)
Chloride: 98 mEq/L (ref 96–112)
Creatinine, Ser: 1.33 mg/dL — ABNORMAL HIGH (ref 0.40–1.20)
GFR: 38.11 mL/min — ABNORMAL LOW (ref >60.00)
Glucose, Bld: 326 mg/dL — ABNORMAL HIGH (ref 70–99)
Potassium: 4.7 mEq/L (ref 3.5–5.1)
SODIUM: 133 meq/L — AB (ref 135–145)

## 2018-07-13 LAB — CBC WITH DIFFERENTIAL/PLATELET
Basophils Absolute: 0.1 10*3/uL (ref 0.0–0.1)
Basophils Relative: 0.7 % (ref 0.0–3.0)
Eosinophils Absolute: 0.3 10*3/uL (ref 0.0–0.7)
Eosinophils Relative: 2 % (ref 0.0–5.0)
HCT: 43.4 % (ref 36.0–46.0)
Hemoglobin: 15 g/dL (ref 12.0–15.0)
Lymphocytes Relative: 40.3 % (ref 12.0–46.0)
Lymphs Abs: 5.6 10*3/uL — ABNORMAL HIGH (ref 0.7–4.0)
MCHC: 34.6 g/dL (ref 30.0–36.0)
MCV: 96.8 fl (ref 78.0–100.0)
Monocytes Absolute: 0.6 10*3/uL (ref 0.1–1.0)
Monocytes Relative: 4.1 % (ref 3.0–12.0)
Neutro Abs: 7.3 10*3/uL (ref 1.4–7.7)
Neutrophils Relative %: 52.9 % (ref 43.0–77.0)
Platelets: 37 10*3/uL — CL (ref 150.0–400.0)
RBC: 4.48 Mil/uL (ref 3.87–5.11)
RDW: 12.8 % (ref 11.5–15.5)
WBC: 13.8 10*3/uL — ABNORMAL HIGH (ref 4.0–10.5)

## 2018-07-13 LAB — POCT GLYCOSYLATED HEMOGLOBIN (HGB A1C): HEMOGLOBIN A1C: 8.9 % — AB (ref 4.0–5.6)

## 2018-07-13 LAB — TSH: TSH: 3.92 u[IU]/mL (ref 0.35–4.50)

## 2018-07-13 LAB — IBC PANEL
Iron: 113 ug/dL (ref 42–145)
Saturation Ratios: 35.6 % (ref 20.0–50.0)
Transferrin: 227 mg/dL (ref 212.0–360.0)

## 2018-07-13 MED ORDER — INSULIN GLARGINE (2 UNIT DIAL) 300 UNIT/ML ~~LOC~~ SOPN: 950.0000 [IU] | PEN_INJECTOR | SUBCUTANEOUS | 11 refills | Status: DC

## 2018-07-13 NOTE — Telephone Encounter (Signed)
[facility] ph# [phone] called re: they questions about a RX that was sent in for insulin today. Please call pharmacy at the ph# listed above to advise.

## 2018-07-13 NOTE — Telephone Encounter (Signed)
Main lab called with a critical platelet count of 37 due to clumping.

## 2018-07-13 NOTE — Telephone Encounter (Signed)
Returned call and confirmed dosage of 950 units qd, 30 day supply. Verbalized acceptance and understanding.

## 2018-07-13 NOTE — Patient Instructions (Addendum)
check your blood sugar 4 times a day: before the 3 meals, and at bedtime.  also check if you have symptoms of your blood sugar being too high or too low.  please keep a record of the readings and bring it to your next appointment here (or you can bring the meter itself).  You can write it on any piece of paper.  please call us sooner if your blood sugar goes below 70, or if you have a lot of readings over 200.  Please increase the Toujeo to 950 units each morning.   Please continue the same other diabetes medications Blood tests are requested for you today.  We'll let you know about the results.  Please come back for a follow-up appointment in 2 months.

## 2018-07-13 NOTE — Progress Notes (Signed)
Subjective:    Patient ID: Carrie Atkins, female    DOB: 1934/05/13, 85 y.o.   MRN: [id number]  HPI Pt returns for f/u of diabetes mellitus: DM type: 1 Dx'ed: 1497 Complications: polyneuropathy and renal insuff.   Therapy: insulin since 0263, trulicity, and amaryl.   GDM: never.   DKA: once (2018) Severe hypoglycemia: never. Pancreatitis: never Pancreatic imaging: never.   Other: dtr provides hx, due to pt's memory loss, but pt gives her own insulin; she takes qd insulin, after poor results with MDI; proper injection technique was verified with CDE; edema limits oral rx options.   Interval history: Pt takes meds as rx'ed. she brings a record of her cbg's which I have reviewed today.  Glucose varies from 137-392.  pt states she feels well in general, except for generalized weakness.   Past Medical History:  Diagnosis Date  . Chest pain   . Chronic lymphocytic leukemia ([place])   . CLL (chronic lymphocytic leukemia) ([place]) 06/05/2013  . Diabetes mellitus   . Hyperlipidemia   . Hypertension   . Leukemia ([place])   . SOB (shortness of breath)     Past Surgical History:  Procedure Laterality Date  . CARDIOVASCULAR STRESS TEST  05/29/2010   EF 83%  . CHOLECYSTECTOMY    . TONSILLECTOMY    . TOTAL KNEE ARTHROPLASTY    . US ECHOCARDIOGRAPHY  01/10/2007   EF 55-60%  . US ECHOCARDIOGRAPHY  02/23/2006    EF 55-60%    Social History   Socioeconomic History  . Marital status: Widowed    Spouse name: Not on file  . Number of children: 1  . Years of education: 52  . Highest education level: Not on file  Occupational History  . Not on file  Social Needs  . Financial resource strain: Not on file  . Food insecurity:    Worry: Not on file    Inability: Not on file  . Transportation needs:    Medical: Not on file    Non-medical: Not on file  Tobacco Use  . Smoking status: Never Smoker  . Smokeless tobacco: Never Used  Substance and Sexual Activity  . Alcohol use: No  . Drug use:  No  . Sexual activity: Never  Lifestyle  . Physical activity:    Days per week: Not on file    Minutes per session: Not on file  . Stress: Not on file  Relationships  . Social connections:    Talks on phone: Not on file    Gets together: Not on file    Attends religious service: Not on file    Active member of club or organization: Not on file    Attends meetings of clubs or organizations: Not on file    Relationship status: Not on file  . Intimate partner violence:    Fear of current or ex partner: Not on file    Emotionally abused: Not on file    Physically abused: Not on file    Forced sexual activity: Not on file  Other Topics Concern  . Not on file  Social History Narrative   Lives in "mother in law suite" at her sons house   Drinks decaf    Current Outpatient Medications on File Prior to Visit  Medication Sig Dispense Refill  . acetaminophen (TYLENOL ARTHRITIS PAIN) 650 MG CR tablet Take 1,300 mg by mouth 2 (two) times daily.      Marland Kitchen aspirin EC 81 MG tablet Take  81 mg by mouth daily.    Marland Kitchen buPROPion (WELLBUTRIN SR) 100 MG 12 hr tablet Take 100 mg by mouth daily.     Marland Kitchen CALCIUM-MAGNESIUM-ZINC PO Take 2 tablets by mouth 2 (two) times daily.     . carvedilol (COREG) 3.125 MG tablet TAKE 1 TABLET BY MOUTH TWICE DAILY WITH A MEAL 60 tablet 9  . Cranberry 450 MG CAPS Take 1 tablet by mouth 2 (two) times daily.    . Dulaglutide (TRULICITY) 1.5 UJ/8.1XB SOPN Inject 1.5 mg into the skin once a week. 4 pen 11  . ferrous sulfate 325 (65 FE) MG tablet Take 325 mg by mouth daily with breakfast.      . glimepiride (AMARYL) 2 MG tablet Take 1 tablet (2 mg total) by mouth daily with breakfast. 30 tablet 11  . levothyroxine (SYNTHROID, LEVOTHROID) 25 MCG tablet Take 25 mcg by mouth daily.    . methenamine (HIPREX) 1 g tablet 2 (two) times daily with a meal.     . ranitidine (ZANTAC) 150 MG tablet Take 150 mg by mouth 2 (two) times daily as needed for heartburn.     No current  facility-administered medications on file prior to visit.     Allergies  Allergen Reactions  . Sulfonamide Derivatives Nausea And Vomiting  . Hydrocodone Nausea And Vomiting  . Sulfa Antibiotics Nausea And Vomiting    Family History  Problem Relation Age of Onset  . Stroke Mother   . Lung cancer Father   . COPD Brother   . Diabetes Maternal Grandmother    BP 130/68 (BP Location: Right Arm, Patient Position: Sitting, Cuff Size: Large)   Pulse 69   Ht 5' (1.524 m)   Wt 195 lb (88.5 kg)   SpO2 96%   BMI 38.08 kg/m   Review of Systems She denies hypoglycemia and falls.     Objective:   Physical Exam VITAL SIGNS:  See vs page GENERAL: no distress Pulses: dorsalis pedis intact bilat.   MSK: no deformity of the feet CV: no leg edema.  Skin:  no ulcer on the feet.  normal color and temp on the feet. Neuro: sensation is intact to touch on the feet.  Ext: There is bilateral onychomycosis of the toenails   Lab Results  Component Value Date   HGBA1C 8.9 (A) 07/13/2018      Assessment & Plan:  Type 2 DM, with renal insuff: worse.   Weakness, uncertain etiology: check labs.    Patient Instructions  check your blood sugar 4 times a day: before the 3 meals, and at bedtime.  also check if you have symptoms of your blood sugar being too high or too low.  please keep a record of the readings and bring it to your next appointment here (or you can bring the meter itself).  You can write it on any piece of paper.  please call us sooner if your blood sugar goes below 70, or if you have a lot of readings over 200.  Please increase the Toujeo to 950 units each morning.   Please continue the same other diabetes medications Blood tests are requested for you today.  We'll let you know about the results.  Please come back for a follow-up appointment in 2 months.

## 2018-07-14 ENCOUNTER — Other Ambulatory Visit: Payer: Self-pay

## 2018-07-14 MED ORDER — INSULIN GLARGINE (2 UNIT DIAL) 300 UNIT/ML ~~LOC~~ SOPN: 950.0000 [IU] | PEN_INJECTOR | SUBCUTANEOUS | 11 refills | Status: DC

## 2018-07-18 ENCOUNTER — Other Ambulatory Visit (INDEPENDENT_AMBULATORY_CARE_PROVIDER_SITE_OTHER): Payer: Medicare Other

## 2018-07-18 DIAGNOSIS — D649 Anemia, unspecified: Secondary | ICD-10-CM | POA: Diagnosis not present

## 2018-07-20 ENCOUNTER — Other Ambulatory Visit: Payer: Self-pay | Admitting: Endocrinology

## 2018-07-20 ENCOUNTER — Other Ambulatory Visit (INDEPENDENT_AMBULATORY_CARE_PROVIDER_SITE_OTHER): Payer: Medicare Other

## 2018-07-20 DIAGNOSIS — D649 Anemia, unspecified: Secondary | ICD-10-CM

## 2018-07-20 LAB — CBC WITH DIFFERENTIAL/PLATELET
Basophils Absolute: 0.1 10*3/uL (ref 0.0–0.1)
Basophils Relative: 0.6 % (ref 0.0–3.0)
Eosinophils Absolute: 0.2 10*3/uL (ref 0.0–0.7)
Eosinophils Relative: 1.6 % (ref 0.0–5.0)
HCT: 42 % (ref 36.0–46.0)
HEMOGLOBIN: 14.3 g/dL (ref 12.0–15.0)
Lymphocytes Relative: 39.8 % (ref 12.0–46.0)
Lymphs Abs: 5.3 10*3/uL — ABNORMAL HIGH (ref 0.7–4.0)
MCHC: 34.1 g/dL (ref 30.0–36.0)
MCV: 96.6 fl (ref 78.0–100.0)
Monocytes Absolute: 0.8 10*3/uL (ref 0.1–1.0)
Monocytes Relative: 6.1 % (ref 3.0–12.0)
Neutro Abs: 6.8 10*3/uL (ref 1.4–7.7)
Neutrophils Relative %: 51.9 % (ref 43.0–77.0)
Platelets: 159.5 10*3/uL (ref 150.0–400.0)
RBC: 4.34 Mil/uL (ref 3.87–5.11)
RDW: 12.9 % (ref 11.5–15.5)
WBC: 13.2 10*3/uL — AB (ref 4.0–10.5)

## 2018-08-26 ENCOUNTER — Other Ambulatory Visit: Payer: Self-pay | Admitting: Physician Assistant

## 2018-09-09 ENCOUNTER — Telehealth: Payer: Self-pay | Admitting: Endocrinology

## 2018-09-09 NOTE — Telephone Encounter (Signed)
Carrie Atkins called and is cancelling 09/13/2018 appointment and would like a call back.  She states that she wants to give numbers and discuss insulin dosages.

## 2018-09-13 ENCOUNTER — Ambulatory Visit: Payer: Medicare Other | Admitting: Endocrinology

## 2018-09-14 ENCOUNTER — Other Ambulatory Visit: Payer: Self-pay

## 2018-09-14 ENCOUNTER — Ambulatory Visit (INDEPENDENT_AMBULATORY_CARE_PROVIDER_SITE_OTHER): Payer: Medicare Other | Admitting: Endocrinology

## 2018-09-14 DIAGNOSIS — E1022 Type 1 diabetes mellitus with diabetic chronic kidney disease: Secondary | ICD-10-CM

## 2018-09-14 DIAGNOSIS — N183 Chronic kidney disease, stage 3 (moderate): Secondary | ICD-10-CM | POA: Diagnosis not present

## 2018-09-14 NOTE — Progress Notes (Addendum)
Subjective:    Patient ID: Carrie Atkins, female    DOB: 15-Dec-1933, 85 y.o.   MRN: [id number]  HPI  telehealth visit today via telephone visit: I spoke to dtr, x 10 minutes:  Alternatives to telehealth are presented to this patient, and the patient agrees to the telehealth visit. Pt is advised of the cost of the visit, and agrees to this, also.   Patient is at home, and I am at the office.   Pt returns for f/u of diabetes mellitus: DM type: 1 Dx'ed: 6160 Complications: polyneuropathy and renal insuff.   Therapy: insulin since 7371, trulicity, and amaryl.   GDM: never.   DKA: once (2018) Severe hypoglycemia: never. Pancreatitis: never Pancreatic imaging: never.   Other: dtr provides hx, due to pt's memory loss, but pt gives her own insulin; she takes qd insulin, after poor results with MDI; proper injection technique was verified with CDE; edema limits oral rx options; she has severe insulin resistance.    Interval history: Pt reports cbg varies from 83-489.  There is no trend throughout the day.  She says cbg is slightly higher on the few days prior to trulicity dose.  pt states she feels well in general.   She stopped continuous glucose monitor, due to arm pain.   Past Medical History:  Diagnosis Date  . Chest pain   . Chronic lymphocytic leukemia ([place])   . CLL (chronic lymphocytic leukemia) ([place]) 06/05/2013  . Diabetes mellitus   . Hyperlipidemia   . Hypertension   . Leukemia ([place])   . SOB (shortness of breath)     Past Surgical History:  Procedure Laterality Date  . CARDIOVASCULAR STRESS TEST  05/29/2010   EF 83%  . CHOLECYSTECTOMY    . TONSILLECTOMY    . TOTAL KNEE ARTHROPLASTY    . US ECHOCARDIOGRAPHY  01/10/2007   EF 55-60%  . US ECHOCARDIOGRAPHY  02/23/2006    EF 55-60%    Social History   Socioeconomic History  . Marital status: Widowed    Spouse name: Not on file  . Number of children: 1  . Years of education: 42  . Highest education level: Not on file   Occupational History  . Not on file  Social Needs  . Financial resource strain: Not on file  . Food insecurity:    Worry: Not on file    Inability: Not on file  . Transportation needs:    Medical: Not on file    Non-medical: Not on file  Tobacco Use  . Smoking status: Never Smoker  . Smokeless tobacco: Never Used  Substance and Sexual Activity  . Alcohol use: No  . Drug use: No  . Sexual activity: Never  Lifestyle  . Physical activity:    Days per week: Not on file    Minutes per session: Not on file  . Stress: Not on file  Relationships  . Social connections:    Talks on phone: Not on file    Gets together: Not on file    Attends religious service: Not on file    Active member of club or organization: Not on file    Attends meetings of clubs or organizations: Not on file    Relationship status: Not on file  . Intimate partner violence:    Fear of current or ex partner: Not on file    Emotionally abused: Not on file    Physically abused: Not on file    Forced sexual activity:  Not on file  Other Topics Concern  . Not on file  Social History Narrative   Lives in "mother in law suite" at her sons house   Drinks decaf    Current Outpatient Medications on File Prior to Visit  Medication Sig Dispense Refill  . acetaminophen (TYLENOL ARTHRITIS PAIN) 650 MG CR tablet Take 1,300 mg by mouth 2 (two) times daily.      Marland Kitchen aspirin EC 81 MG tablet Take 81 mg by mouth daily.    Marland Kitchen buPROPion (WELLBUTRIN SR) 100 MG 12 hr tablet Take 100 mg by mouth daily.     Marland Kitchen CALCIUM-MAGNESIUM-ZINC PO Take 2 tablets by mouth 2 (two) times daily.     . carvedilol (COREG) 3.125 MG tablet TAKE 1 TABLET BY MOUTH TWICE DAILY WITH A MEAL 60 tablet 0  . Cranberry 450 MG CAPS Take 1 tablet by mouth 2 (two) times daily.    . Dulaglutide (TRULICITY) 1.5 MO/2.9UT SOPN Inject 1.5 mg into the skin once a week. 4 pen 11  . ferrous sulfate 325 (65 FE) MG tablet Take 325 mg by mouth daily with breakfast.      .  glimepiride (AMARYL) 2 MG tablet Take 1 tablet (2 mg total) by mouth daily with breakfast. 30 tablet 11  . levothyroxine (SYNTHROID, LEVOTHROID) 25 MCG tablet Take 25 mcg by mouth daily.    . methenamine (HIPREX) 1 g tablet 2 (two) times daily with a meal.     . ranitidine (ZANTAC) 150 MG tablet Take 150 mg by mouth 2 (two) times daily as needed for heartburn.     No current facility-administered medications on file prior to visit.     Allergies  Allergen Reactions  . Sulfonamide Derivatives Nausea And Vomiting  . Hydrocodone Nausea And Vomiting  . Sulfa Antibiotics Nausea And Vomiting    Family History  Problem Relation Age of Onset  . Stroke Mother   . Lung cancer Father   . COPD Brother   . Diabetes Maternal Grandmother     There were no vitals taken for this visit.  Review of Systems She denies hypoglycemia    Objective:   Physical Exam      Lab Results  Component Value Date   CREATININE 1.33 (H) 07/13/2018   BUN 19 07/13/2018   NA 133 (L) 07/13/2018   K 4.7 07/13/2018   CL 98 07/13/2018   CO2 26 07/13/2018   Lab Results  Component Value Date   HGBA1C 9.1 (A) 09/15/2018      Assessment & Plan:  type 1 DM, with stage 3 renal disaease: she needs increased rx.    Patient Instructions  check your blood sugar 4 times a day: before the 3 meals, and at bedtime.  also check if you have symptoms of your blood sugar being too high or too low.  please keep a record of the readings and bring it to your next appointment here (or you can bring the meter itself).  You can write it on any piece of paper.  please call us sooner if your blood sugar goes below 70, or if you have a lot of readings over 200.  Please come in for the a1c test. We'll let you know about the results. .  Please come back for a follow-up appointment in 2 months.

## 2018-09-14 NOTE — Patient Instructions (Addendum)
check your blood sugar 4 times a day: before the 3 meals, and at bedtime.  also check if you have symptoms of your blood sugar being too high or too low.  please keep a record of the readings and bring it to your next appointment here (or you can bring the meter itself).  You can write it on any piece of paper.  please call us sooner if your blood sugar goes below 70, or if you have a lot of readings over 200.  Please come in for the a1c test. We'll let you know about the results. .  Please come back for a follow-up appointment in 2 months.

## 2018-09-14 NOTE — Telephone Encounter (Signed)
Please schedule pt for webex, mychart or telephone visit

## 2018-09-15 ENCOUNTER — Other Ambulatory Visit: Payer: Self-pay

## 2018-09-15 ENCOUNTER — Other Ambulatory Visit (INDEPENDENT_AMBULATORY_CARE_PROVIDER_SITE_OTHER): Payer: Medicare Other

## 2018-09-15 ENCOUNTER — Telehealth: Payer: Self-pay | Admitting: Endocrinology

## 2018-09-15 DIAGNOSIS — E1022 Type 1 diabetes mellitus with diabetic chronic kidney disease: Secondary | ICD-10-CM

## 2018-09-15 DIAGNOSIS — N183 Chronic kidney disease, stage 3 (moderate): Secondary | ICD-10-CM

## 2018-09-15 LAB — POCT GLYCOSYLATED HEMOGLOBIN (HGB A1C): Hemoglobin A1C: 9.1 % — AB (ref 4.0–5.6)

## 2018-09-15 MED ORDER — INSULIN GLARGINE (2 UNIT DIAL) 300 UNIT/ML ~~LOC~~ SOPN: 950.0000 [IU] | PEN_INJECTOR | SUBCUTANEOUS | 11 refills | Status: DC

## 2018-09-15 NOTE — Telephone Encounter (Signed)
Paperwork needs to be sent to [employer] patient assistance with correct medication put into section 2 of papwork  Please be advised that 17 boxes have already been sent to office as Trujeo not Kathaleen Grinder Max  Any questions please call Ruby at [phone] reference patient name and DOB

## 2018-09-19 ENCOUNTER — Other Ambulatory Visit: Payer: Self-pay

## 2018-09-19 ENCOUNTER — Telehealth: Payer: Self-pay | Admitting: Endocrinology

## 2018-09-19 MED ORDER — INSULIN GLARGINE (2 UNIT DIAL) 300 UNIT/ML ~~LOC~~ SOPN: 1000.0000 [IU] | PEN_INJECTOR | SUBCUTANEOUS | 11 refills | Status: AC

## 2018-09-19 MED ORDER — PEN NEEDLES 29G X 12MM MISC: 1.0000 | Freq: Every day | 2 refills | Status: AC

## 2018-09-19 MED ORDER — INSULIN GLARGINE (2 UNIT DIAL) 300 UNIT/ML ~~LOC~~ SOPN: 1000.0000 [IU] | PEN_INJECTOR | SUBCUTANEOUS | 11 refills | Status: DC

## 2018-09-19 NOTE — Telephone Encounter (Signed)
Called dtr and informed of below:  Original paperwork for [employer] was sent to HIM for scanning purposes. Scanned paperwork has been printed and placed on Dr. Cordelia Pen desk for review and completion. Dr. Loanne Drilling is out of the office this week. Updated Rx cannot be completed, signed nor faxed until his return. Will return completed document as requested upon his return.  Verbalized acceptance and understanding

## 2018-09-19 NOTE — Telephone Encounter (Signed)
Patient's Daughter is stating that she spoke with [employer] and that page 4 in the paperwork needed to be changed since the patient's Toujeo has changed. That this paperwork needs to be changed to MAX.   Please advise

## 2018-09-19 NOTE — Telephone Encounter (Signed)
Original paperwork for [employer] was sent to HIM for scanning purposes. Scanned paperwork has been printed and placed on Dr. Cordelia Pen desk for review and completion. Dr. Loanne Drilling is out of the office this week. Updated Rx cannot be completed, signed nor faxed until his return. Will return completed document as requested upon his return.

## 2018-09-19 NOTE — Telephone Encounter (Signed)
I am unaware 

## 2018-09-19 NOTE — Telephone Encounter (Signed)
I do not have this paperwork. Please advise

## 2018-09-19 NOTE — Telephone Encounter (Signed)
Closed as duplicate 

## 2018-09-19 NOTE — Telephone Encounter (Signed)
Patient's Daughter is returning a call from Friday to go over the patient's lab results.   Please advise

## 2018-09-21 ENCOUNTER — Telehealth: Payer: Self-pay | Admitting: Endocrinology

## 2018-09-21 NOTE — Telephone Encounter (Signed)
Per request from [place] with [employer], Dr. Loanne Drilling has added Toujeo Max and updated [employer] PAP forms to reflect new Rx'd dosage of 1000 units qam. Faxed document to [employer], attention Ruby. Confirmation received. Documents and fax confirmation have been placed in the faxed file for future reference.

## 2018-09-21 NOTE — Telephone Encounter (Signed)
On [facility] paper work, [phone]"

## 2018-09-21 NOTE — Telephone Encounter (Signed)
Per [facility], "Caller is [place], PharmD from [employer]. She needs to speak to someone in the office about patient Solostar injection care plan."

## 2018-09-21 NOTE — Telephone Encounter (Signed)
Returned call. Spoke with Lilia Pro who states she was unable to locate any notes pertaining to [employer]. Was able to see the call that took place yesterday between [facility] and me re: Toujeo/Toujeo Max. States she will send a message to [facility] to let her know that I returned her call but further added she will not be in today until after 1pm. Informed our office closes at [place] she try to call me back BEFORE 4pm to ensure her question is answered. Verbalized acceptance and understanding.

## 2018-09-21 NOTE — Telephone Encounter (Signed)
No phone # or specific location of [place] where Carrie Atkins is located has been provided in order for me to return her call.

## 2018-09-22 ENCOUNTER — Other Ambulatory Visit: Payer: Self-pay | Admitting: Cardiovascular Disease

## 2018-09-22 ENCOUNTER — Telehealth: Payer: Self-pay | Admitting: Endocrinology

## 2018-09-22 ENCOUNTER — Telehealth: Payer: Self-pay

## 2018-09-22 NOTE — Telephone Encounter (Signed)
[employer] is calling in regards to a patient assistance form and has questions about dosage, Insulin Glargine, 2 Unit Dial, (TOUJEO MAX SOLOSTAR) 300 UNIT/ML SOPN requesting update on page 4 of application.  Ph # [phone] after 2 o'clock / Bertram Millard

## 2018-09-22 NOTE — Telephone Encounter (Signed)
Spoke with pt's daughter about a telephone visit with [name]. She gave consent to a telephone visit on 10/10/2018 at 3pm. Pt is unable to obtain a BP, HR, or weight at home.     YOUR CARDIOLOGY TEAM HAS ARRANGED FOR AN E-VISIT FOR YOUR APPOINTMENT - PLEASE REVIEW IMPORTANT INFORMATION BELOW SEVERAL DAYS PRIOR TO YOUR APPOINTMENT  Due to the recent COVID-19 pandemic, we are transitioning in-person office visits to tele-medicine visits in an effort to decrease unnecessary exposure to our patients and staff. Medicare and most insurances are covering these visits without a copay needed. You will need a working email and a smartphone or computer with a camera and microphone. For patients that do not have these items, we can still complete the visit using a telephone but do prefer video when possible. If possible, we also ask that you have a blood pressure cuff and scale at home to measure your blood pressure, heart rate and weight prior to your scheduled appointment. Patients with clinical needs that need an in-person evaluation and testing will still be able to come to the office if absolutely necessary. If you have any questions, feel free to call our office.     DOWNLOADING THE SOFTWARE  Download the [employer] app to enable video and telephone visits with your [facility] Provider.   Instructions for downloading [employer] WebEx: - Go to [email or web address]://[email or web address].[email or web address].[email or web address] and follow the instructions, or download the app on your smartphone [facility] [employer] Meetings). - If you have technical difficulties with downloading WebEx, please call WebEx at [phone]. - Once the app is downloaded (can be done on either mobile or desktop computer), go to Settings in the upper left hand corner.  Be sure that camera and audio are enabled.  - You will receive an email message with a link to the meeting with a time to join for your tele-health visit.  - Please download the app and have settings configured  prior to the appointment time.      2-3 DAYS BEFORE YOUR APPOINTMENT  One of our staff will call you to confirm that you have been able to set up your WebEx account. We will remind you check your blood pressure, heart rate and weight prior to your scheduled appointment. If you have an Apple Watch or Kardia, please upload any pertinent ECG strips the day before or morning of your appointment to [place]. Our staff will also make sure you have reviewed the consent and agree to move forward with your scheduled tele-health visit.    THE DAY OF YOUR APPOINTMENT  Approximately 15-20 minutes prior to your scheduled appointment, you will receive an e-mail directly from one of our staff member's [email or web address] e-mail accounts inviting you to join a WebEx meeting.  Please do not reply to that email - simply join the [employer].  Upon joining, a member of the office staff will speak with you initially through the WebEx platform to confirm medications, vital signs for the day and any symptoms you may be experiencing.  Please have this information available prior to the time of visit start.      CONSENT FOR TELE-HEALTH VISIT - PLEASE RVIEW  I hereby voluntarily request, consent and authorize [facility] and its employed or contracted physicians, physician assistants, nurse practitioners or other licensed health care professionals (the Practitioner), to provide me with telemedicine health care services (the "Services") as deemed necessary by the treating Practitioner. I acknowledge and consent to receive the Services by the Practitioner via  telemedicine. I understand that the telemedicine visit will involve communicating with the Practitioner through live audiovisual communication technology and the disclosure of certain medical information by electronic transmission. I acknowledge that I have been given the opportunity to request an in-person assessment or other available alternative prior to the  telemedicine visit and am voluntarily participating in the telemedicine visit.  I understand that I have the right to withhold or withdraw my consent to the use of telemedicine in the course of my care at any time, without affecting my right to future care or treatment, and that the Practitioner or I may terminate the telemedicine visit at any time. I understand that I have the right to inspect all information obtained and/or recorded in the course of the telemedicine visit and may receive copies of available information for a reasonable fee.  I understand that some of the potential risks of receiving the Services via telemedicine include:  Marland Kitchen Delay or interruption in medical evaluation due to technological equipment failure or disruption; . Information transmitted may not be sufficient (e.g. poor resolution of images) to allow for appropriate medical decision making by the Practitioner; and/or  . In rare instances, security protocols could fail, causing a breach of personal health information.  Furthermore, I acknowledge that it is my responsibility to provide information about my medical history, conditions and care that is complete and accurate to the best of my ability. I acknowledge that Practitioner's advice, recommendations, and/or decision may be based on factors not within their control, such as incomplete or inaccurate data provided by me or distortions of diagnostic images or specimens that may result from electronic transmissions. I understand that the practice of medicine is not an exact science and that Practitioner makes no warranties or guarantees regarding treatment outcomes. I acknowledge that I will receive a copy of this consent concurrently upon execution via email to the email address I last provided but may also request a printed copy by calling the office of [place].    I understand that my insurance will be billed for this visit.   I have read or had this consent read to  me. . I understand the contents of this consent, which adequately explains the benefits and risks of the Services being provided via telemedicine.  . I have been provided ample opportunity to ask questions regarding this consent and the Services and have had my questions answered to my satisfaction. . I give my informed consent for the services to be provided through the use of telemedicine in my medical care  By participating in this telemedicine visit I agree to the above.

## 2018-09-23 ENCOUNTER — Other Ambulatory Visit: Payer: Self-pay | Admitting: Cardiovascular Disease

## 2018-09-26 NOTE — Telephone Encounter (Signed)
Called Carrie Atkins and informed that the form had been updated per her request and faxed per her request. Included in the fax was also a fax cover informing her that the change was made per her request for clarification purposes. States the form is still unclear. Advised to fax a new form with ONLY page 4. Will complete and advise Dr. Loanne Drilling to once again sign. This will not occur until he returns to the office tomorrow. Verbalized acceptance and understanding.

## 2018-10-04 ENCOUNTER — Telehealth: Payer: Self-pay

## 2018-10-04 NOTE — Telephone Encounter (Signed)
Received notification from [facility] stating application for [employer] has been denied d/t max dosage being 300 units daily. Called [employer] to inquire further and seek clarification and spoke with [place]. Informed her of the issues we encountered and what we have done up to this point to resolve the issue:  1. Ruby with [employer] asked that the application be changed from [employer] to [employer]. States since pt dosage has been increased from 900 units daily to 1000 units daily, in order for [employer] to provide pt with LESS pens, Rx would need to reflect Toujeo Max. Further added, "JUST ADD TOUJEO MAX TO MEDICATION #2 ON PAGE 4" --- Complied with her request and faxed documents as requested  2. Ruby called asking for clarification of order stating there are Rx's for both Toujeo and Toujeo Max. Ruby then asked, which did Dr. Loanne Drilling want. --- Reminded Ruby about our conversation and about her request. Ruby then asked that we provide a fax cover sheet with this clarification. --- Complied again to her request.  3. Received call from [facility] stating application remains unclear. Asked that we only fax page 4 with [place]. --- New application printed and page 4 was completed per her request.  4. Received call from [employer] stating application is incomplete since they did not receive ALL pages of the application. --- Reminded [employer] of all conversations has with Ruby. Asked that Ruby call to further discuss this NEW concern. Never received returned call. However, received letter about denial (indicated above in the entry).  5. Spoke with Malachy Mood and asked for clarification about "Max units daily". Further added Dr. Loanne Drilling does not care which the pt receives so long as the pt receives her insulin. Reminded, all these unnecessary steps were completed per [employer]'s request. Had the order remained as Toujeo, would there be an issue? Malachy Mood states she would discuss with her manager because she does understand the confusion they  have created.  6. Malachy Mood returned call. States Medical Information is requesting dosage be altered to reflect 0.2-0.4/kg of pt body weight per package insert. Advised this request would need to be discussed between Dr. Loanne Drilling and another physician. At this time, we are going to cease further communication re: their requests until a physician to physician discussion can be completed.  Informed Dr. Loanne Drilling of above. Advised that we cease efforts at this time. Asked that I inform the pt and her daughter of all our efforts and issues. Further asked that the pt and her dtr seek assistance elsewhere (eg: [place]). Called pt and her dtr. Informed of above and Dr. Cordelia Pen advice. Verbalized acceptance and understanding.

## 2018-10-05 ENCOUNTER — Telehealth: Payer: Self-pay | Admitting: Cardiovascular Disease

## 2018-10-05 NOTE — Telephone Encounter (Signed)
Spoke with patient's daughter, Melynda Keller [facility]) who confirmed all demographics. Daughter has a smart phone that they will use for the visit. Will have vitals. Patient uses My Chart.

## 2018-10-10 ENCOUNTER — Encounter: Payer: Self-pay | Admitting: Cardiovascular Disease

## 2018-10-10 ENCOUNTER — Other Ambulatory Visit: Payer: Self-pay

## 2018-10-10 ENCOUNTER — Telehealth (INDEPENDENT_AMBULATORY_CARE_PROVIDER_SITE_OTHER): Payer: Medicare Other | Admitting: Cardiovascular Disease

## 2018-10-10 VITALS — BP 128/76 | HR 73 | Ht 60.0 in | Wt 189.2 lb

## 2018-10-10 DIAGNOSIS — I10 Essential (primary) hypertension: Secondary | ICD-10-CM

## 2018-10-10 DIAGNOSIS — Z7189 Other specified counseling: Secondary | ICD-10-CM

## 2018-10-10 DIAGNOSIS — I480 Paroxysmal atrial fibrillation: Secondary | ICD-10-CM

## 2018-10-10 DIAGNOSIS — I5032 Chronic diastolic (congestive) heart failure: Secondary | ICD-10-CM | POA: Diagnosis not present

## 2018-10-10 NOTE — Progress Notes (Signed)
Virtual Visit via Video Note   This visit type was conducted due to national recommendations for restrictions regarding the COVID-19 Pandemic (e.g. social distancing) in an effort to limit this patient's exposure and mitigate transmission in our community.  Due to her co-morbid illnesses, this patient is at least at moderate risk for complications without adequate follow up.  This format is felt to be most appropriate for this patient at this time.  All issues noted in this document were discussed and addressed.  A limited physical exam was performed with this format.  Please refer to the patient's chart for her consent to telehealth for [facility].   Evaluation Performed:  Follow-up visit  Date:  10/10/2018   ID:  Carrie Atkins, DOB [birth year], MRN [id number]  Patient Location: Home Provider Location: Home / office  PCP:  Leonard Downing, MD  Cardiologist:  Mertie Moores, MD  Electrophysiologist:  None   Chief Complaint: paroxysmal Atrial fib , HTN  History of Present Illness:    Carrie Atkins is a 85 y.o. female with hypertension, hyperlipidemia and paroxysmal atrial fibrillation.  Her last echocardiogram was performed in April, 2018 which showed ventricular systolic function.  She has grade 1 diastolic dysfunction.  She has mild elevation of pulmonary artery pressures.  Seen by video visit - aided by daughter, Melynda Keller.   She has declined over the past several months. Has fallen twice over the past month  Walks with a walker,  Breathing is ok Spending more and more time in bed.   Generalized weakness.    The patient does not have symptoms concerning for COVID-19 infection (fever, chills, cough, or new shortness of breath).    Past Medical History:  Diagnosis Date  . Chest pain   . Chronic lymphocytic leukemia ([place])   . CLL (chronic lymphocytic leukemia) ([place]) 06/05/2013  . Diabetes mellitus   . Hyperlipidemia   . Hypertension   . Leukemia ([place])   . SOB  (shortness of breath)    Past Surgical History:  Procedure Laterality Date  . CARDIOVASCULAR STRESS TEST  05/29/2010   EF 83%  . CHOLECYSTECTOMY    . TONSILLECTOMY    . TOTAL KNEE ARTHROPLASTY    . US ECHOCARDIOGRAPHY  01/10/2007   EF 55-60%  . US ECHOCARDIOGRAPHY  02/23/2006    EF 55-60%     Current Meds  Medication Sig  . acetaminophen (TYLENOL ARTHRITIS PAIN) 650 MG CR tablet Take 1,300 mg by mouth 2 (two) times daily.    Marland Kitchen aspirin EC 81 MG tablet Take 81 mg by mouth daily.  Marland Kitchen buPROPion (WELLBUTRIN SR) 100 MG 12 hr tablet Take 100 mg by mouth daily.   Marland Kitchen CALCIUM-MAGNESIUM-ZINC PO Take 2 tablets by mouth 2 (two) times daily.   . carvedilol (COREG) 3.125 MG tablet TAKE 1 TABLET BY MOUTH TWICE DAILY WITH A MEAL  . Cranberry 450 MG CAPS Take 1 tablet by mouth 2 (two) times daily.  Marland Kitchen DAILY MULTIPLE VITAMINS PO Take 1 tablet by mouth daily.  . Dulaglutide (TRULICITY) 1.5 OT/1.5BW SOPN Inject 1.5 mg into the skin once a week.  . ferrous sulfate 325 (65 FE) MG tablet Take 325 mg by mouth daily with breakfast.    . glimepiride (AMARYL) 2 MG tablet Take 1 tablet (2 mg total) by mouth daily with breakfast.  . Insulin Glargine, 2 Unit Dial, (TOUJEO MAX SOLOSTAR) 300 UNIT/ML SOPN Inject 1,000 Units into the skin every morning.  . Insulin Pen Needle (PEN  NEEDLES) 29G X 12MM MISC 1 each by Does not apply route daily.  Marland Kitchen levothyroxine (SYNTHROID, LEVOTHROID) 25 MCG tablet Take 25 mcg by mouth daily.  . methenamine (HIPREX) 1 g tablet 2 (two) times daily with a meal.      Allergies:   Sulfonamide derivatives; Hydrocodone; and Sulfa antibiotics   Social History   Tobacco Use  . Smoking status: Never Smoker  . Smokeless tobacco: Never Used  Substance Use Topics  . Alcohol use: No  . Drug use: No     Family Hx: The patient's family history includes COPD in her brother; Diabetes in her maternal grandmother; Lung cancer in her father; Stroke in her mother.  ROS:   Please see the history of  present illness.     All other systems reviewed and are negative.   Prior CV studies:   The following studies were reviewed today:    Labs/Other Tests and Data Reviewed:    EKG:  No ECG reviewed.  Recent Labs: 07/13/2018: BUN 19; Creatinine, Ser 1.33; Potassium 4.7; Sodium 133; TSH 3.92 07/20/2018: Hemoglobin 14.3; Platelets 159.5 Repeated and verified X2.   Recent Lipid Panel Lab Results  Component Value Date/Time   CHOL 153 02/03/2011 08:47 AM   TRIG 163.0 (H) 02/03/2011 08:47 AM   HDL 34.90 (L) 02/03/2011 08:47 AM   CHOLHDL 4 02/03/2011 08:47 AM   LDLCALC 86 02/03/2011 08:47 AM    Wt Readings from Last 3 Encounters:  10/10/18 189 lb 3.2 oz (85.8 kg)  07/13/18 195 lb (88.5 kg)  04/12/18 186 lb 6.4 oz (84.6 kg)     Objective:    Vital Signs:  BP 128/76 (BP Location: Right Arm, Patient Position: Sitting, Cuff Size: Normal)   Pulse 73   Ht 5' (1.524 m)   Wt 189 lb 3.2 oz (85.8 kg)   BMI 36.95 kg/m    VITAL SIGNS:  reviewed GEN:  no acute distress.   Elderly female, Chronically ill appearing  EYES:  sclerae anicteric, EOMI - Extraocular Movements Intact RESPIRATORY:  normal respiratory effort, symmetric expansion CARDIOVASCULAR:  no peripheral edema SKIN:  no rash, lesions or ulcers. MUSCULOSKELETAL:  no obvious deformities. NEURO:  alert and oriented x 3, no obvious focal deficit PSYCH:  normal affect  ASSESSMENT & PLAN:    1. 1.  Hypertension: Her blood pressure seems to be well controlled.  Continue current medications.  2.  Chronic diastolic congestive heart failure: Selia has a history of chronic diastolic heart failure.  It is still eats lots of salty foods including processed meats.  I have advised her to work on that.  3.  Paroxysmal atrial fib :   she has a history of paroxysmal atrial fibrillation.  She is not on any because of her history of frequent falls.  HR is very well controlled.   She is not able to exercise at all.  She is very unsteady on  her feet and has fallen multiple times.  She walks with the walker.  Lying in bed when I did our video visit.  She appears to be chronically ill and her overall health seems to be gradually declining   COVID-19 Education: The signs and symptoms of COVID-19 were discussed with the patient and how to seek care for testing (follow up with PCP or arrange E-visit).  The importance of social distancing was discussed today.  Time:   Today, I have spent 20  minutes with the patient with telehealth technology discussing the above problems.  Additional 10 minutes with precharting and charting   Medication Adjustments/Labs and Tests Ordered: Current medicines are reviewed at length with the patient today.  Concerns regarding medicines are outlined above.   Tests Ordered: No orders of the defined types were placed in this encounter.   Medication Changes: No orders of the defined types were placed in this encounter.   Disposition:  Follow up in 6 month(s)  Signed, Mertie Moores, MD  10/10/2018 3:14 PM    [facility]

## 2018-10-10 NOTE — Patient Instructions (Signed)

## 2018-10-18 ENCOUNTER — Other Ambulatory Visit: Payer: Self-pay | Admitting: Endocrinology

## 2018-10-25 ENCOUNTER — Other Ambulatory Visit: Payer: Self-pay | Admitting: Endocrinology

## 2018-11-17 ENCOUNTER — Other Ambulatory Visit: Payer: Self-pay | Admitting: Endocrinology

## 2018-11-25 ENCOUNTER — Telehealth: Payer: Self-pay

## 2018-11-25 ENCOUNTER — Other Ambulatory Visit: Payer: Self-pay | Admitting: Endocrinology

## 2018-11-25 NOTE — Telephone Encounter (Signed)
LOV 09/14/18. Per Dr. Loanne Drilling, f/u in 2 mo. Called pt to schedule appt. LVM requesting returned call.

## 2018-11-26 ENCOUNTER — Other Ambulatory Visit: Payer: Self-pay | Admitting: Endocrinology

## 2018-11-28 NOTE — Telephone Encounter (Signed)
Please refill x 1 F/u is due  

## 2018-12-14 DEATH — deceased

## 2019-04-13 IMAGING — RF DG SWALLOWING FUNCTION - [facility]
12 series · 20 of 24 positions shown · non-contrast
Comparison: none

[Series 1: cp_standard · 0.34mm/px · 2 of 130 frames shown (1 of 12)]
[frame 6/130]
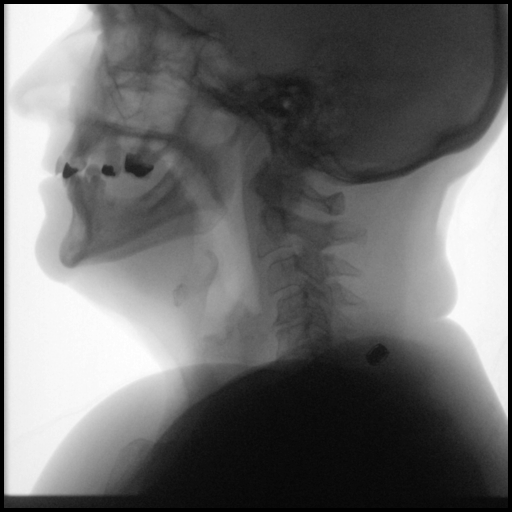
[frame 66/130]
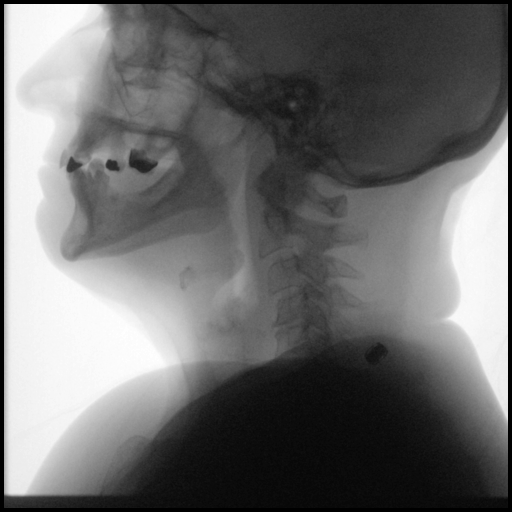

[Series 2: cp_standard · 0.34mm/px · 1 of 51 frames shown (2 of 12)]
[frame 44/51]
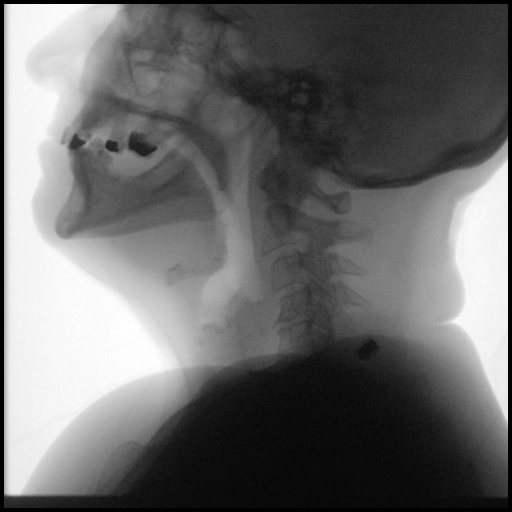

[Series 3: cp_standard · 0.34mm/px · 2 of 32 frames shown (3 of 12)]
[frame 5/32]
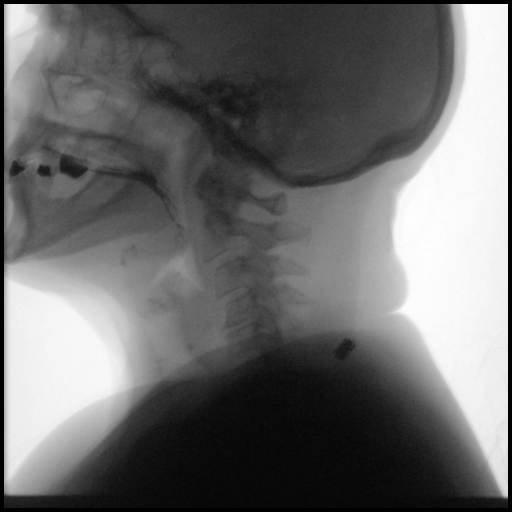
[frame 28/32]
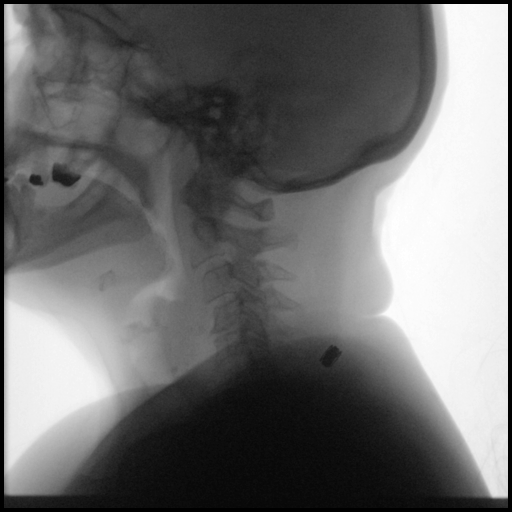

[Series 4: cp_standard · 0.34mm/px · 2 of 151 frames shown (4 of 12)]
[frame 76/151]
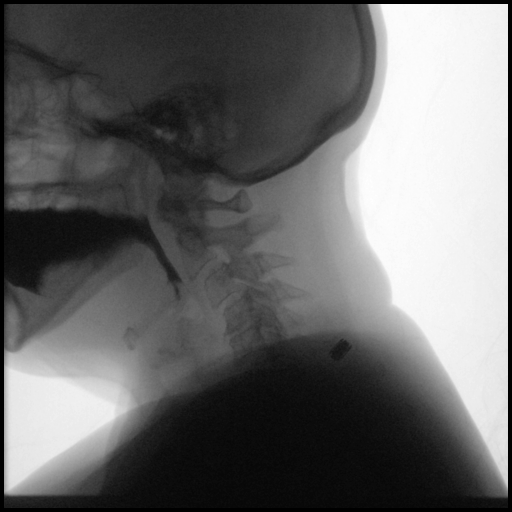
[frame 129/151]
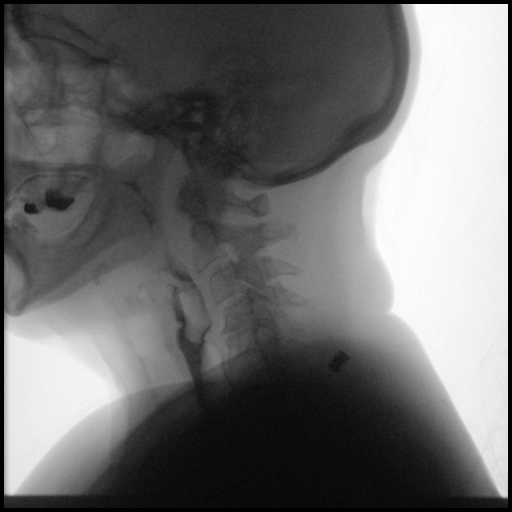

[Series 5: cp_standard · 0.34mm/px · 1 of 80 frames shown (5 of 12)]
[frame 69/80]
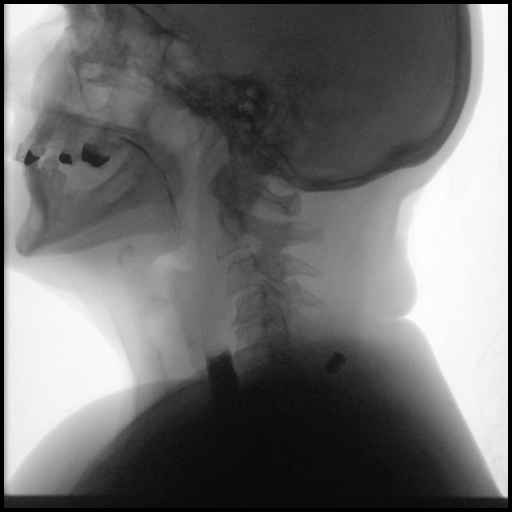

[Series 6: cp_standard · 0.34mm/px · 2 of 51 frames shown (6 of 12)]
[frame 26/51]
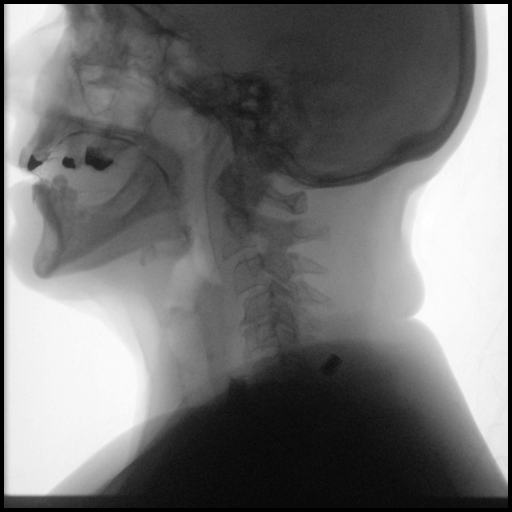
[frame 44/51]
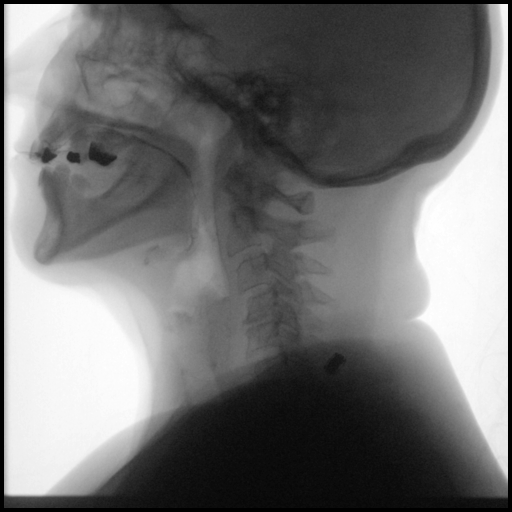

[Series 7: cp_standard · 0.34mm/px · 2 of 104 frames shown (7 of 12)]
[frame 21/104]
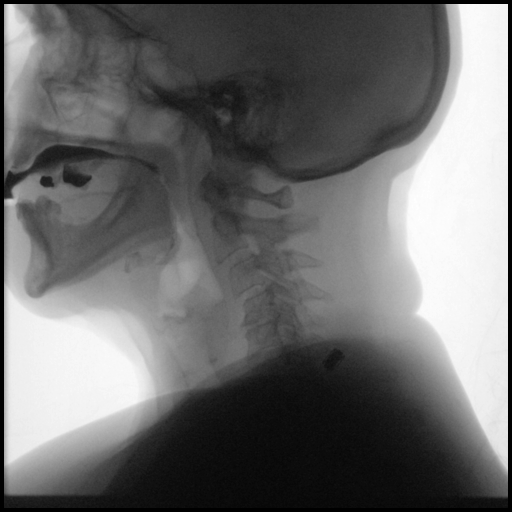
[frame 89/104]
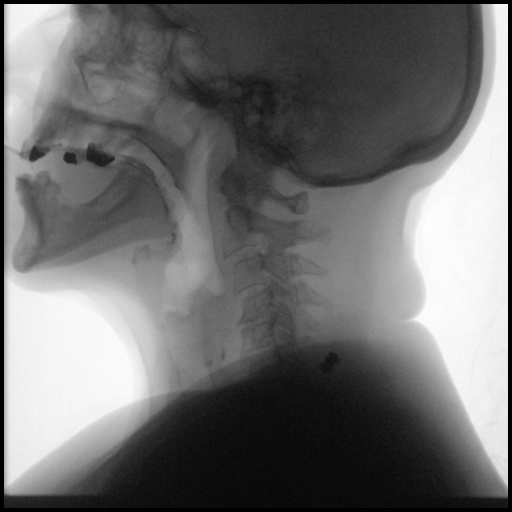

[Series 8: cp_standard · 0.34mm/px · 1 of 147 frames shown (8 of 12)]
[frame 128/147]
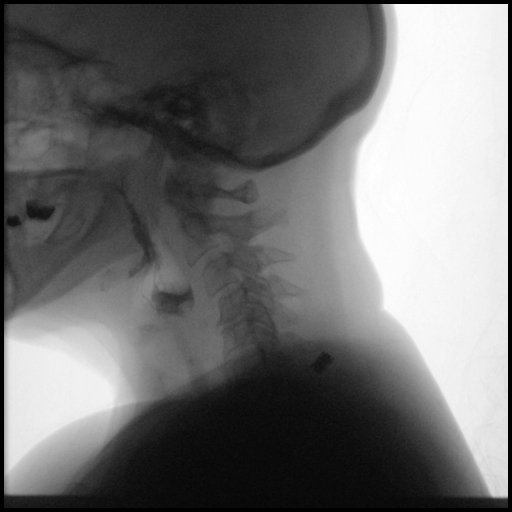

[Series 9: cp_standard · 0.34mm/px · 2 of 291 frames shown (9 of 12)]
[frame 44/291]
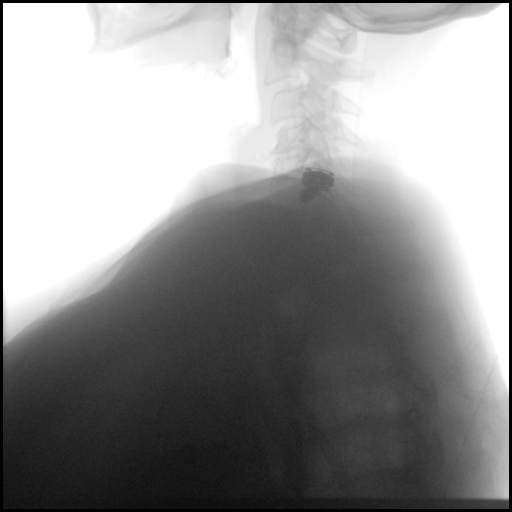
[frame 248/291]
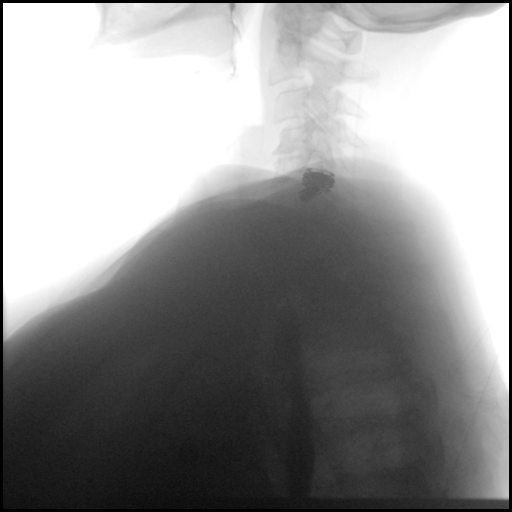

[Series 10: cp_standard · 0.34mm/px · 2 of 186 frames shown (10 of 12)]
[frame 81/186]
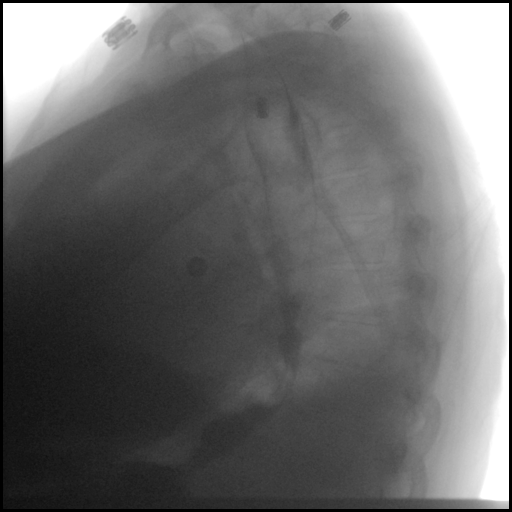
[frame 159/186]
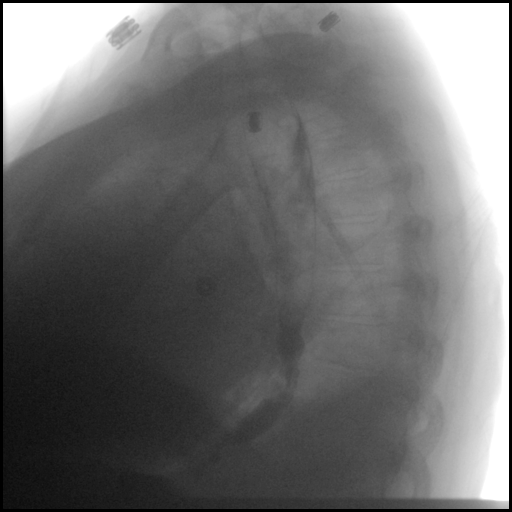

[Series 11: cp_standard · 0.34mm/px · 1 of 94 frames shown (11 of 12)]
[frame 80/94]
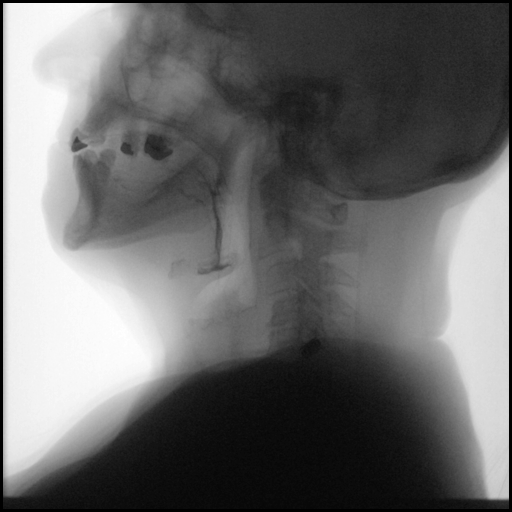

[Series 12: cp_standard · 0.34mm/px · 2 of 70 frames shown (12 of 12)]
[frame 36/70]
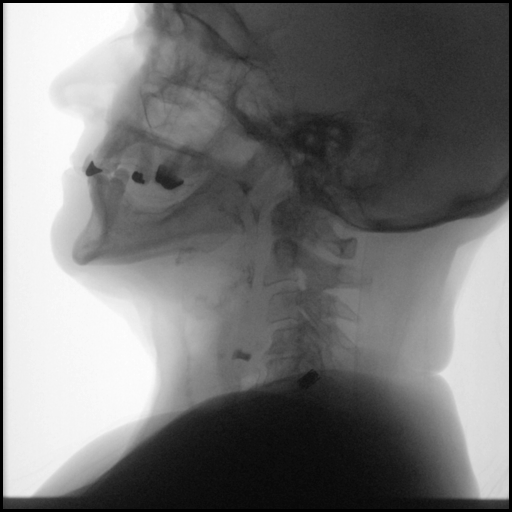
[frame 60/70]
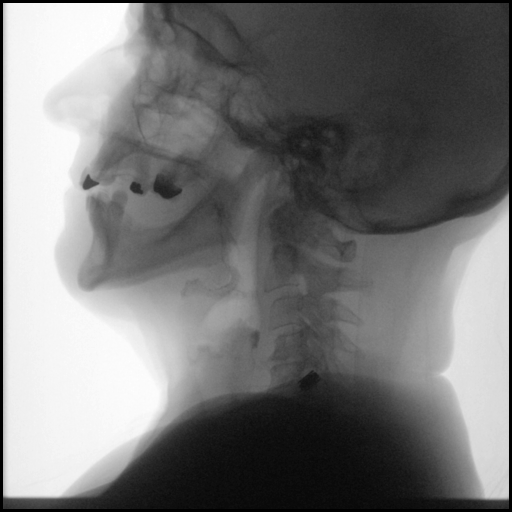

[20 of 24 positions shown; findings below may reference images not displayed]

FLUOROSCOPY FOR SWALLOWING FUNCTION STUDY:
Fluoroscopy was provided for swallowing function study, which was administered by a speech pathologist.  Final results and recommendations from this study are contained within the speech pathology report.
